# Patient Record
Sex: Female | Born: 1975 | Race: Black or African American | Hispanic: No | Marital: Married | Smoking: Never smoker
Health system: Southern US, Community
[De-identification: ages and names within clinical notes are randomized; demographics above are authoritative.]

## PROBLEM LIST (undated history)

## (undated) ENCOUNTER — Inpatient Hospital Stay (HOSPITAL_COMMUNITY): Payer: Self-pay

## (undated) DIAGNOSIS — I471 Supraventricular tachycardia, unspecified: Secondary | ICD-10-CM

## (undated) DIAGNOSIS — E559 Vitamin D deficiency, unspecified: Secondary | ICD-10-CM

## (undated) DIAGNOSIS — D352 Benign neoplasm of pituitary gland: Secondary | ICD-10-CM

## (undated) DIAGNOSIS — Z87442 Personal history of urinary calculi: Secondary | ICD-10-CM

## (undated) DIAGNOSIS — M1990 Unspecified osteoarthritis, unspecified site: Secondary | ICD-10-CM

## (undated) DIAGNOSIS — E282 Polycystic ovarian syndrome: Secondary | ICD-10-CM

## (undated) DIAGNOSIS — A6000 Herpesviral infection of urogenital system, unspecified: Secondary | ICD-10-CM

## (undated) DIAGNOSIS — Z9884 Bariatric surgery status: Secondary | ICD-10-CM

## (undated) DIAGNOSIS — G43909 Migraine, unspecified, not intractable, without status migrainosus: Secondary | ICD-10-CM

## (undated) DIAGNOSIS — O364XX0 Maternal care for intrauterine death, not applicable or unspecified: Secondary | ICD-10-CM

## (undated) DIAGNOSIS — K219 Gastro-esophageal reflux disease without esophagitis: Secondary | ICD-10-CM

## (undated) DIAGNOSIS — D497 Neoplasm of unspecified behavior of endocrine glands and other parts of nervous system: Secondary | ICD-10-CM

## (undated) DIAGNOSIS — D649 Anemia, unspecified: Secondary | ICD-10-CM

## (undated) DIAGNOSIS — Z96642 Presence of left artificial hip joint: Secondary | ICD-10-CM

## (undated) DIAGNOSIS — F419 Anxiety disorder, unspecified: Secondary | ICD-10-CM

## (undated) HISTORY — PX: TUBAL LIGATION: SHX77

## (undated) HISTORY — DX: Anemia, unspecified: D64.9

## (undated) HISTORY — DX: Morbid (severe) obesity due to excess calories: E66.01

## (undated) HISTORY — DX: Personal history of urinary calculi: Z87.442

## (undated) HISTORY — DX: Bariatric surgery status: Z98.84

## (undated) HISTORY — PX: 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): SHX6667

## (undated) HISTORY — DX: Anxiety disorder, unspecified: F41.9

## (undated) HISTORY — DX: Presence of left artificial hip joint: Z96.642

## (undated) HISTORY — PX: OTHER SURGICAL HISTORY: SHX169

## (undated) HISTORY — DX: Vitamin D deficiency, unspecified: E55.9

## (undated) HISTORY — DX: Benign neoplasm of pituitary gland: D35.2

## (undated) HISTORY — PX: TOTAL HIP ARTHROPLASTY: SHX124

## (undated) HISTORY — DX: Supraventricular tachycardia, unspecified: I47.10

## (undated) HISTORY — DX: Maternal care for intrauterine death, not applicable or unspecified: O36.4XX0

## (undated) HISTORY — DX: Herpesviral infection of urogenital system, unspecified: A60.00

---

## 2004-08-11 ENCOUNTER — Encounter: Admission: RE | Admit: 2004-08-11 | Discharge: 2004-08-11 | Payer: Self-pay | Admitting: Obstetrics and Gynecology

## 2004-12-05 ENCOUNTER — Other Ambulatory Visit: Admission: RE | Admit: 2004-12-05 | Discharge: 2004-12-05 | Payer: Self-pay | Admitting: Obstetrics and Gynecology

## 2005-08-08 ENCOUNTER — Encounter: Admission: RE | Admit: 2005-08-08 | Discharge: 2005-11-06 | Payer: Self-pay | Admitting: Obstetrics and Gynecology

## 2005-10-03 ENCOUNTER — Ambulatory Visit (HOSPITAL_COMMUNITY): Admission: RE | Admit: 2005-10-03 | Discharge: 2005-10-03 | Payer: Self-pay | Admitting: Obstetrics and Gynecology

## 2005-12-25 ENCOUNTER — Other Ambulatory Visit: Admission: RE | Admit: 2005-12-25 | Discharge: 2005-12-25 | Payer: Self-pay | Admitting: Obstetrics and Gynecology

## 2006-06-08 ENCOUNTER — Emergency Department (HOSPITAL_COMMUNITY): Admission: EM | Admit: 2006-06-08 | Discharge: 2006-06-08 | Payer: Self-pay | Admitting: *Deleted

## 2007-02-27 ENCOUNTER — Emergency Department (HOSPITAL_COMMUNITY): Admission: EM | Admit: 2007-02-27 | Discharge: 2007-02-27 | Payer: Self-pay | Admitting: Emergency Medicine

## 2008-02-27 ENCOUNTER — Ambulatory Visit: Payer: Self-pay | Admitting: Family Medicine

## 2008-02-27 DIAGNOSIS — E282 Polycystic ovarian syndrome: Secondary | ICD-10-CM | POA: Insufficient documentation

## 2008-02-27 DIAGNOSIS — J309 Allergic rhinitis, unspecified: Secondary | ICD-10-CM

## 2008-02-27 DIAGNOSIS — E663 Overweight: Secondary | ICD-10-CM | POA: Insufficient documentation

## 2008-02-27 HISTORY — DX: Allergic rhinitis, unspecified: J30.9

## 2008-02-27 LAB — CONVERTED CEMR LAB
ALT: 20 units/L (ref 0–35)
AST: 22 units/L (ref 0–37)
Albumin: 3.7 g/dL (ref 3.5–5.2)
Alkaline Phosphatase: 70 units/L (ref 39–117)
BUN: 8 mg/dL (ref 6–23)
Basophils Absolute: 0 10*3/uL (ref 0.0–0.1)
Basophils Relative: 0.4 % (ref 0.0–1.0)
Bilirubin, Direct: 0.1 mg/dL (ref 0.0–0.3)
CO2: 27 meq/L (ref 19–32)
Calcium: 9.2 mg/dL (ref 8.4–10.5)
Chloride: 107 meq/L (ref 96–112)
Cholesterol: 136 mg/dL (ref 0–200)
Creatinine, Ser: 0.7 mg/dL (ref 0.4–1.2)
Eosinophils Absolute: 0.1 10*3/uL (ref 0.0–0.7)
Eosinophils Relative: 1.5 % (ref 0.0–5.0)
GFR calc Af Amer: 126 mL/min
GFR calc non Af Amer: 104 mL/min
Glucose, Bld: 94 mg/dL (ref 70–99)
HCT: 36.2 % (ref 36.0–46.0)
HDL: 40 mg/dL (ref >39.0)
Hemoglobin: 12 g/dL (ref 12.0–15.0)
Hgb A1c MFr Bld: 5.8 % (ref 4.6–6.0)
LDL Cholesterol: 85 mg/dL (ref 0–99)
Lymphocytes Relative: 46.9 % — ABNORMAL HIGH (ref 12.0–46.0)
MCHC: 33.2 g/dL (ref 30.0–36.0)
MCV: 83.3 fL (ref 78.0–100.0)
Monocytes Absolute: 0.3 10*3/uL (ref 0.1–1.0)
Monocytes Relative: 6.8 % (ref 3.0–12.0)
Neutro Abs: 1.8 10*3/uL (ref 1.4–7.7)
Neutrophils Relative %: 44.4 % (ref 43.0–77.0)
Platelets: 263 10*3/uL (ref 150–400)
Potassium: 4.4 meq/L (ref 3.5–5.1)
RBC: 4.34 M/uL (ref 3.87–5.11)
RDW: 13.5 % (ref 11.5–14.6)
Sodium: 140 meq/L (ref 135–145)
TSH: 0.9 microintl units/mL (ref 0.35–5.50)
Total Bilirubin: 0.6 mg/dL (ref 0.3–1.2)
Total CHOL/HDL Ratio: 3.4
Total Protein: 7.4 g/dL (ref 6.0–8.3)
Triglycerides: 55 mg/dL (ref 0–149)
VLDL: 11 mg/dL (ref 0–40)
WBC: 4 10*3/uL — ABNORMAL LOW (ref 4.5–10.5)

## 2008-03-06 ENCOUNTER — Encounter: Payer: Self-pay | Admitting: Family Medicine

## 2008-03-06 ENCOUNTER — Ambulatory Visit: Payer: Self-pay | Admitting: Family Medicine

## 2008-03-06 DIAGNOSIS — D239 Other benign neoplasm of skin, unspecified: Secondary | ICD-10-CM | POA: Insufficient documentation

## 2008-03-23 ENCOUNTER — Encounter (INDEPENDENT_AMBULATORY_CARE_PROVIDER_SITE_OTHER): Payer: Self-pay | Admitting: Obstetrics and Gynecology

## 2008-03-23 ENCOUNTER — Ambulatory Visit (HOSPITAL_COMMUNITY): Admission: RE | Admit: 2008-03-23 | Discharge: 2008-03-23 | Payer: Self-pay | Admitting: Obstetrics and Gynecology

## 2008-08-17 ENCOUNTER — Ambulatory Visit (HOSPITAL_COMMUNITY): Admission: RE | Admit: 2008-08-17 | Discharge: 2008-08-17 | Payer: Self-pay | Admitting: Obstetrics and Gynecology

## 2008-09-03 ENCOUNTER — Emergency Department (HOSPITAL_COMMUNITY): Admission: EM | Admit: 2008-09-03 | Discharge: 2008-09-04 | Payer: Self-pay | Admitting: Emergency Medicine

## 2009-09-28 ENCOUNTER — Observation Stay (HOSPITAL_COMMUNITY): Admission: AD | Admit: 2009-09-28 | Discharge: 2009-09-28 | Payer: Self-pay | Admitting: Obstetrics and Gynecology

## 2009-09-28 ENCOUNTER — Encounter (HOSPITAL_COMMUNITY): Payer: Self-pay | Admitting: Emergency Medicine

## 2009-09-30 ENCOUNTER — Inpatient Hospital Stay (HOSPITAL_COMMUNITY): Admission: AD | Admit: 2009-09-30 | Discharge: 2009-09-30 | Payer: Self-pay | Admitting: Obstetrics and Gynecology

## 2009-10-02 ENCOUNTER — Inpatient Hospital Stay (HOSPITAL_COMMUNITY): Admission: AD | Admit: 2009-10-02 | Discharge: 2009-10-02 | Payer: Self-pay | Admitting: Obstetrics and Gynecology

## 2009-10-04 ENCOUNTER — Encounter (INDEPENDENT_AMBULATORY_CARE_PROVIDER_SITE_OTHER): Payer: Self-pay | Admitting: Obstetrics and Gynecology

## 2009-10-04 ENCOUNTER — Observation Stay (HOSPITAL_COMMUNITY): Admission: AD | Admit: 2009-10-04 | Discharge: 2009-10-04 | Payer: Self-pay | Admitting: Obstetrics and Gynecology

## 2010-01-11 ENCOUNTER — Encounter: Admission: RE | Admit: 2010-01-11 | Discharge: 2010-01-11 | Payer: Self-pay | Admitting: Obstetrics and Gynecology

## 2010-06-03 ENCOUNTER — Encounter: Admission: RE | Admit: 2010-06-03 | Discharge: 2010-06-03 | Payer: Self-pay | Admitting: Neurology

## 2010-09-11 ENCOUNTER — Encounter: Payer: Self-pay | Admitting: Family Medicine

## 2010-11-09 LAB — CBC
HCT: 25.5 % — ABNORMAL LOW (ref 36.0–46.0)
HCT: 30.3 % — ABNORMAL LOW (ref 36.0–46.0)
HCT: 34.1 % — ABNORMAL LOW (ref 36.0–46.0)
HCT: 34.3 % — ABNORMAL LOW (ref 36.0–46.0)
Hemoglobin: 11 g/dL — ABNORMAL LOW (ref 12.0–15.0)
Hemoglobin: 11 g/dL — ABNORMAL LOW (ref 12.0–15.0)
Hemoglobin: 9.4 g/dL — ABNORMAL LOW (ref 12.0–15.0)
Hemoglobin: 9.7 g/dL — ABNORMAL LOW (ref 12.0–15.0)
MCHC: 31.9 g/dL (ref 30.0–36.0)
MCHC: 32.1 g/dL (ref 30.0–36.0)
MCHC: 32.2 g/dL (ref 30.0–36.0)
MCHC: 32.5 g/dL (ref 30.0–36.0)
MCV: 81.3 fL (ref 78.0–100.0)
MCV: 81.6 fL (ref 78.0–100.0)
MCV: 81.6 fL (ref 78.0–100.0)
MCV: 81.8 fL (ref 78.0–100.0)
Platelets: 210 10*3/uL (ref 150–400)
Platelets: 228 10*3/uL (ref 150–400)
Platelets: 236 10*3/uL (ref 150–400)
Platelets: 241 10*3/uL (ref 150–400)
RBC: 3.12 MIL/uL — ABNORMAL LOW (ref 3.87–5.11)
RBC: 3.57 MIL/uL — ABNORMAL LOW (ref 3.87–5.11)
RBC: 3.71 MIL/uL — ABNORMAL LOW (ref 3.87–5.11)
RBC: 4.19 MIL/uL (ref 3.87–5.11)
RBC: 4.2 MIL/uL (ref 3.87–5.11)
RDW: 15 % (ref 11.5–15.5)
RDW: 15.3 % (ref 11.5–15.5)
RDW: 15.4 % (ref 11.5–15.5)
RDW: 15.6 % — ABNORMAL HIGH (ref 11.5–15.5)
WBC: 4.7 10*3/uL (ref 4.0–10.5)
WBC: 5.5 10*3/uL (ref 4.0–10.5)
WBC: 6.5 10*3/uL (ref 4.0–10.5)
WBC: 7.5 10*3/uL (ref 4.0–10.5)

## 2010-11-09 LAB — CROSSMATCH: ABO/RH(D): O POS

## 2010-11-09 LAB — URINALYSIS, ROUTINE W REFLEX MICROSCOPIC
Bilirubin Urine: NEGATIVE
Hgb urine dipstick: NEGATIVE
Specific Gravity, Urine: 1.02 (ref 1.005–1.030)
Urobilinogen, UA: 0.2 mg/dL (ref 0.0–1.0)
pH: 6 (ref 5.0–8.0)

## 2010-11-09 LAB — DIFFERENTIAL
Basophils Absolute: 0 10*3/uL (ref 0.0–0.1)
Basophils Absolute: 0 10*3/uL (ref 0.0–0.1)
Basophils Relative: 0 % (ref 0–1)
Basophils Relative: 1 % (ref 0–1)
Eosinophils Absolute: 0 10*3/uL (ref 0.0–0.7)
Eosinophils Absolute: 0.1 10*3/uL (ref 0.0–0.7)
Eosinophils Relative: 1 % (ref 0–5)
Eosinophils Relative: 1 % (ref 0–5)
Lymphocytes Relative: 30 % (ref 12–46)
Lymphocytes Relative: 41 % (ref 12–46)
Lymphs Abs: 1.9 10*3/uL (ref 0.7–4.0)
Lymphs Abs: 2.3 10*3/uL (ref 0.7–4.0)
Monocytes Absolute: 0.2 10*3/uL (ref 0.1–1.0)
Monocytes Absolute: 0.3 10*3/uL (ref 0.1–1.0)
Monocytes Relative: 4 % (ref 3–12)
Monocytes Relative: 6 % (ref 3–12)
Neutro Abs: 2.8 10*3/uL (ref 1.7–7.7)
Neutro Abs: 4.3 10*3/uL (ref 1.7–7.7)
Neutrophils Relative %: 52 % (ref 43–77)
Neutrophils Relative %: 66 % (ref 43–77)

## 2010-11-09 LAB — PROGESTERONE: Progesterone: 1.8 ng/mL

## 2010-11-09 LAB — COMPREHENSIVE METABOLIC PANEL
ALT: 16 U/L (ref 0–35)
AST: 23 U/L (ref 0–37)
Albumin: 3.6 g/dL (ref 3.5–5.2)
Alkaline Phosphatase: 73 U/L (ref 39–117)
BUN: 7 mg/dL (ref 6–23)
CO2: 25 mEq/L (ref 19–32)
Calcium: 9.4 mg/dL (ref 8.4–10.5)
Chloride: 106 mEq/L (ref 96–112)
Creatinine, Ser: 0.66 mg/dL (ref 0.4–1.2)
GFR calc Af Amer: >60 mL/min (ref >60)
GFR calc non Af Amer: >60 mL/min (ref >60)
Glucose, Bld: 86 mg/dL (ref 70–99)
Potassium: 4.2 mEq/L (ref 3.5–5.1)
Sodium: 137 mEq/L (ref 135–145)
Total Bilirubin: 0.4 mg/dL (ref 0.3–1.2)
Total Protein: 7.1 g/dL (ref 6.0–8.3)

## 2010-11-09 LAB — URINE MICROSCOPIC-ADD ON: RBC / HPF: NONE SEEN RBC/hpf (ref <3)

## 2010-11-09 LAB — HCG, QUANTITATIVE, PREGNANCY
hCG, Beta Chain, Quant, S: 1746 m[IU]/mL — ABNORMAL HIGH (ref <5)
hCG, Beta Chain, Quant, S: 2071 m[IU]/mL — ABNORMAL HIGH (ref <5)
hCG, Beta Chain, Quant, S: 2273 m[IU]/mL — ABNORMAL HIGH (ref <5)

## 2010-11-09 LAB — ABO/RH: ABO/RH(D): O POS

## 2010-11-10 LAB — URINALYSIS, ROUTINE W REFLEX MICROSCOPIC
Glucose, UA: NEGATIVE mg/dL
Hgb urine dipstick: NEGATIVE
Specific Gravity, Urine: 1.023 (ref 1.005–1.030)
Urobilinogen, UA: 0.2 mg/dL (ref 0.0–1.0)
pH: 6 (ref 5.0–8.0)

## 2010-11-10 LAB — URINE MICROSCOPIC-ADD ON

## 2010-11-10 LAB — CBC
HCT: 29.9 % — ABNORMAL LOW (ref 36.0–46.0)
Hemoglobin: 9.7 g/dL — ABNORMAL LOW (ref 12.0–15.0)
RBC: 3.63 MIL/uL — ABNORMAL LOW (ref 3.87–5.11)
WBC: 5.9 10*3/uL (ref 4.0–10.5)

## 2010-11-10 LAB — DIFFERENTIAL
Basophils Absolute: 0 10*3/uL (ref 0.0–0.1)
Lymphocytes Relative: 49 % — ABNORMAL HIGH (ref 12–46)
Lymphs Abs: 2.9 10*3/uL (ref 0.7–4.0)
Monocytes Absolute: 0.5 10*3/uL (ref 0.1–1.0)
Monocytes Relative: 9 % (ref 3–12)
Neutro Abs: 2.3 10*3/uL (ref 1.7–7.7)

## 2010-11-10 LAB — ABO/RH: ABO/RH(D): O POS

## 2010-11-10 LAB — BASIC METABOLIC PANEL
Calcium: 8.5 mg/dL (ref 8.4–10.5)
GFR calc Af Amer: >60 mL/min (ref >60)
GFR calc non Af Amer: >60 mL/min (ref >60)
Potassium: 4.3 mEq/L (ref 3.5–5.1)
Sodium: 136 mEq/L (ref 135–145)

## 2010-12-05 LAB — URINALYSIS, ROUTINE W REFLEX MICROSCOPIC
Bilirubin Urine: NEGATIVE
Glucose, UA: NEGATIVE mg/dL
Ketones, ur: NEGATIVE mg/dL
Nitrite: NEGATIVE
Protein, ur: NEGATIVE mg/dL
pH: 6 (ref 5.0–8.0)

## 2010-12-05 LAB — URINE MICROSCOPIC-ADD ON

## 2010-12-05 LAB — URINE CULTURE

## 2011-01-03 NOTE — Op Note (Signed)
Sarah Saunders, SHIFLETT NO.:  [id number]   MEDICAL RECORD NO.:  [id number]          PATIENT TYPE:  AMB   LOCATION:  SDC                           FACILITY:  [facility]   PHYSICIAN:  Sandra A. Rivard, M.D. DATE OF BIRTH:  Dec 30, 1975   DATE OF PROCEDURE:  03/23/2008  DATE OF DISCHARGE:                               OPERATIVE REPORT   PREOPERATIVE DIAGNOSIS:  Bilateral hydrosalpinx with dysfunctional  uterine bleeding secondary to polycystic ovarian syndrome.   POSTOPERATIVE DIAGNOSES:  1. Bilateral hydrosalpinx with dysfunctional uterine bleeding      secondary to polycystic ovarian syndrome.  2. Pelvic adhesions.  3. Fitz-Hugh and Curtis syndrome.   ANESTHESIA:  General, Quillian Quince, MD   PROCEDURES:  1. Dilation and curettage.  2. Operative laparoscopy with lysis of adhesions.  3. Cure of bilateral hydrosalpinx.   SURGEON:  Crist Fat. Rivard, MD   ASSISTANT:  Elmira J. Lowell Guitar, PA   PROCEDURE IN DETAIL:  After being informed of the planned procedure with possible  complications including bleeding, infection, injury to bowel, bladder or  ureters, and need for laparotomy, an informed consent was obtained.  The  patient was taken to OR#3, given general anesthesia with endotracheal  intubation without complication.  The patient was placed in a lithotomy  position with knee-high sequential compressive devices, prepped and  draped in a sterile fashion, and a Foley catheter was inserted in her  bladder.  A weighted speculum was inserted.  The anterior lip of the  cervix was grasped with a tenaculum forceps and the uterus was sounded  at 8.5 cm.  The cervix was easily dilated using Hegar dilator until #27,  which allowed easy entry of a small sharp curette, which was used to  curette the endometrial cavity removing a small amount of endometrial  curettings.  After this was completed, an acorn manipulator was placed  and held in place by the previously placed  tenaculum forceps.    We infiltrated the umbilical area with 5 mL of Marcaine 0.25 and we  performed a semi-elliptical incision, which was brought down sharply to  the fascia.  The fascia was identified, grasped with Kocher forceps and  incised with Mayo scissors.  After the fascia was incised, attempt to  identify the peritoneum was very difficult due to an unusual placement  of abdominal muscle.  Nevertheless, despite good retraction and  palpation, we are unable to enter the peritoneum in the safe way.  Decision was made at that time to insert a Veress needle.  The placement  of the needle was verified with the drop test and we then insufflated a  pneumoperitoneum with CO2 at a maximum pressure of 15 mmHg.  This was  done in an attempt to more easily identify the peritoneum, but this  attempt fails as well.  A normal 10-mm trocar was then inserted bluntly  in the abdomen after we have placed a purse-string suture of 0 Vicryl on  the fascia.  The trocar was well placed and we can safely insert a  laparoscope with video camera.  A suprapubic 5-mm trocar  was then  inserted after infiltrating with Marcaine 0.25 and the left lower  quadrant 5-mm trocar was also inserted under direct visualization after  infiltrating with Marcaine 0.25.   OBSERVATION:  Anterior cul-de-sac has fine adhesions between the bladder  wall in the lower uterine segment of unknown etiology, the patient never  having had any surgery.  The uterus was normal.  Posterior cul-de-sac  has fine adhesions in the very lower part of the cul-de-sac.  The right  tube has a significant hydrosalpinx with its maximum diameter at the  fimbrial end, which was approximately 4 cm.  No fimbriae is identified,  but the tube is essentially free of adhesions and the right ovary was  normal.  On the left side, it was difficult to evaluate the tube and the  ovary due to small adhesions between the sigmoid colon and the pelvic  wall.  We  do have a sense that the tube was also blocked, but impossible  to mobilize at this time.  The appendix was visualized and normal.  The  liver was visualized and normal, although we see adhesions between its  anterior area and the anterior abdominal wall compatible with the Santiago Bumpers and Lyda Jester syndrome.  The gallbladder was not visualized.  The  stomach was visualized, full of air and a request was made to anesthesia  to insert a NG tube.   Using hot scissors and blunt dissector, we started with removing the  adhesions between the sigmoid colon and the pelvic wall, which was done  essentially without complication.  This now allows [place] to visualize the  left tube in the left ovary, which are partially adherent to the left  pelvic wall with fine grade 1 adhesions.  Once these were sharply  reduced, we can mobilize the tube completely and the ovary completely.  The tubes have a complete hydrosalpinx with absent fimbriae, and its  maximum diameter at the fimbrial end was approximately 3 cm.  The ovary  was normal.  Chromopertubation leads no spillage, but a good filling of  both hydrosalpinx and decision was made to open both of those.  We  started with the right side and performed the same way on the left side  by making a plus incision at the tip with cold sharp scissors.  The  serosa at the 4 corners was then superficially cauterized to proceed  with eversion of the opening.  On the right side, we saw the appearance  of what appears to be normal fimbriae on the lateral wall of the tube.  On the left tube, we proceeded in the same exact fashion, but there was  a little bit of bleeding, which required some cauterization on the edge  of the incision.  At the end of the procedure, chromopertubation leads  to spillage on both sides.  We then profusely irrigated with warm saline  multiple times.  We verified hemostasis, which was very adequate on both  sides and we do leave about 20 mL of  fluid in the pelvis.  The two 5-mm  trocars were removed and the 10-mm umbilical trocar was removed after  evacuating the pneumoperitoneum.  At this point, please let me mention  that during the procedure, the trocar came out and we had to reinsert  under direct visualization, a Hasson trocar with the balloon, which was  inflated with 15 mL, which provided [place] with a much better visualization  keeping the pneumoperitoneum intact.  Once the pneumoperitoneum  was  removed and all trocars were removed, the umbilical fascia was closed  with a previously placed purse-string suture of 0 Vicryl.  All incisions  were then closed with subcuticular suture of 3-0 Monocryl and Dermabond.    Instruments and sponge count were complete x2.  Estimated blood loss was  minimal.  The procedure was very well tolerated by the patient who was  taken to recovery room in a well and stable condition.   SPECIMEN:  Endometrial curettings were sent to pathology.      Crist Fat Rivard, M.D.  Electronically Signed     SAR/MEDQ  D:  03/23/2008  T:  03/24/2008  Job:  [id number]

## 2011-03-20 ENCOUNTER — Emergency Department (HOSPITAL_COMMUNITY)
Admission: EM | Admit: 2011-03-20 | Discharge: 2011-03-20 | Disposition: A | Discharge status: Home / Self Care | Payer: Self-pay | Attending: Emergency Medicine | Admitting: Emergency Medicine

## 2011-03-20 DIAGNOSIS — R110 Nausea: Secondary | ICD-10-CM | POA: Insufficient documentation

## 2011-03-20 DIAGNOSIS — R42 Dizziness and giddiness: Secondary | ICD-10-CM | POA: Insufficient documentation

## 2011-03-20 LAB — URINALYSIS, ROUTINE W REFLEX MICROSCOPIC
Glucose, UA: NEGATIVE mg/dL
Ketones, ur: 15 mg/dL — AB
Protein, ur: 30 mg/dL — AB

## 2011-03-20 LAB — CBC
HCT: 33.6 % — ABNORMAL LOW (ref 36.0–46.0)
Hemoglobin: 11 g/dL — ABNORMAL LOW (ref 12.0–15.0)
MCH: 26.2 pg (ref 26.0–34.0)
MCV: 80 fL (ref 78.0–100.0)
RBC: 4.2 MIL/uL (ref 3.87–5.11)

## 2011-03-20 LAB — BASIC METABOLIC PANEL
CO2: 27 mEq/L (ref 19–32)
Calcium: 9.7 mg/dL (ref 8.4–10.5)
Creatinine, Ser: 0.54 mg/dL (ref 0.50–1.10)
Glucose, Bld: 98 mg/dL (ref 70–99)

## 2011-03-20 LAB — URINE MICROSCOPIC-ADD ON

## 2011-03-20 LAB — POCT PREGNANCY, URINE: Preg Test, Ur: NEGATIVE

## 2011-03-20 LAB — DIFFERENTIAL
Lymphs Abs: 3.1 10*3/uL (ref 0.7–4.0)
Monocytes Relative: 5 % (ref 3–12)
Neutro Abs: 2.1 10*3/uL (ref 1.7–7.7)
Neutrophils Relative %: 37 % — ABNORMAL LOW (ref 43–77)

## 2011-05-19 LAB — CBC
HCT: 36.5
Hemoglobin: 11.6 — ABNORMAL LOW
Platelets: 231
RBC: 4.36
WBC: 5

## 2011-10-17 ENCOUNTER — Encounter (INDEPENDENT_AMBULATORY_CARE_PROVIDER_SITE_OTHER): Payer: Self-pay | Admitting: Obstetrics and Gynecology

## 2011-10-17 DIAGNOSIS — Z3040 Encounter for surveillance of contraceptives, unspecified: Secondary | ICD-10-CM

## 2011-12-16 ENCOUNTER — Emergency Department (HOSPITAL_COMMUNITY)
Admission: EM | Admit: 2011-12-16 | Discharge: 2011-12-16 | Disposition: A | Discharge status: Home / Self Care | Payer: Self-pay | Attending: Emergency Medicine | Admitting: Emergency Medicine

## 2011-12-16 ENCOUNTER — Encounter (HOSPITAL_COMMUNITY): Payer: Self-pay | Admitting: *Deleted

## 2011-12-16 ENCOUNTER — Emergency Department (HOSPITAL_COMMUNITY): Payer: Self-pay

## 2011-12-16 DIAGNOSIS — R002 Palpitations: Secondary | ICD-10-CM | POA: Insufficient documentation

## 2011-12-16 DIAGNOSIS — R079 Chest pain, unspecified: Secondary | ICD-10-CM | POA: Insufficient documentation

## 2011-12-16 HISTORY — DX: Polycystic ovarian syndrome: E28.2

## 2011-12-16 LAB — POCT I-STAT TROPONIN I: Troponin i, poc: 0.04 ng/mL (ref 0.00–0.08)

## 2011-12-16 LAB — DIFFERENTIAL
Eosinophils Relative: 6 % — ABNORMAL HIGH (ref 0–5)
Lymphocytes Relative: 46 % (ref 12–46)
Lymphs Abs: 2.3 10*3/uL (ref 0.7–4.0)
Monocytes Relative: 6 % (ref 3–12)

## 2011-12-16 LAB — COMPREHENSIVE METABOLIC PANEL
ALT: 22 U/L (ref 0–35)
Alkaline Phosphatase: 78 U/L (ref 39–117)
BUN: 15 mg/dL (ref 6–23)
CO2: 23 mEq/L (ref 19–32)
Calcium: 9.5 mg/dL (ref 8.4–10.5)
GFR calc Af Amer: >90 mL/min (ref >90)
GFR calc non Af Amer: >90 mL/min (ref >90)
Glucose, Bld: 103 mg/dL — ABNORMAL HIGH (ref 70–99)
Potassium: 3.8 mEq/L (ref 3.5–5.1)
Sodium: 138 mEq/L (ref 135–145)

## 2011-12-16 LAB — URINALYSIS, ROUTINE W REFLEX MICROSCOPIC
Bilirubin Urine: NEGATIVE
Ketones, ur: NEGATIVE mg/dL
Nitrite: NEGATIVE
Protein, ur: 30 mg/dL — AB
Urobilinogen, UA: 0.2 mg/dL (ref 0.0–1.0)

## 2011-12-16 LAB — TSH: TSH: 1.959 u[IU]/mL (ref 0.350–4.500)

## 2011-12-16 LAB — CBC
Hemoglobin: 12.1 g/dL (ref 12.0–15.0)
MCV: 81.7 fL (ref 78.0–100.0)
Platelets: 241 10*3/uL (ref 150–400)
RBC: 4.6 MIL/uL (ref 3.87–5.11)
WBC: 5.1 10*3/uL (ref 4.0–10.5)

## 2011-12-16 LAB — PREGNANCY, URINE: Preg Test, Ur: NEGATIVE

## 2011-12-16 LAB — URINE MICROSCOPIC-ADD ON

## 2011-12-16 LAB — D-DIMER, QUANTITATIVE: D-Dimer, Quant: 0.43 ug/mL-FEU (ref 0.00–0.48)

## 2011-12-16 MED ORDER — METOPROLOL TARTRATE 25 MG PO TABS: ORAL_TABLET | ORAL | Status: DC

## 2011-12-16 NOTE — ED Notes (Signed)
Returned from x-ray, family at bedside

## 2011-12-16 NOTE — ED Notes (Signed)
Pt denies any palpitations or sob at this time. Pt placed back on monitor after going to the restroom.

## 2011-12-16 NOTE — ED Notes (Signed)
Pt was having sob, found to have Hr 220, 160/90, vagal maneuver performed, hr decreased to 104. Pt says that she has had the same episode prior

## 2011-12-16 NOTE — ED Notes (Signed)
Pt says that she felt her heart racing and was having shortness of breath and chest pain. Symptoms have since subsided. Resting.

## 2011-12-16 NOTE — ED Provider Notes (Cosign Needed)
History     CSN: [id number]  Arrival date & time 12/16/11  4098   First MD Initiated Contact with Patient 12/16/11 0732      Chief Complaint  Patient presents with  . Palpitations    (Consider location/radiation/quality/duration/timing/severity/associated sxs/prior treatment) HPI Comments: Patient is a 32 and her workup this morning and her heart was beating very fast. This lasted about 20 minutes. Paramedics were called and they found her heart rate to 24 blood pressure 150/110 she is unaware of any precipitating event. She says this happens every other kinds. She had just been seen in the ED for at 8 years ago an EKG was normal then. The last prior episode was in February, and she did not seek medical evaluation. The episode has resolved, and she feels some discomfort in her chest at present.  Patient is a 36 y.o. female presenting with palpitations. The history is provided by the patient. No language interpreter was used.  Palpitations  This is a recurrent problem. The current episode started 1 to 2 hours ago. The problem occurs rarely. The problem has been resolved. Associated with: No precipitating event. On average, each episode lasts 20 minutes. Associated symptoms include chest pressure. She has tried nothing for the symptoms. Risk factors include no known risk factors. Past medical history comments: Polycystic ovary disease, prolactinoma..    Past Medical History  Diagnosis Date  . Polycystic ovary disease     Past Surgical History  Procedure Date  . Tubal ligation     right fallopian tube removed    No family history on file.  History  Substance Use Topics  . Smoking status: Never Smoker   . Smokeless tobacco: Not on file  . Alcohol Use: No    OB History    Grav Para Term Preterm Abortions TAB SAB Ect Mult Living                  Review of Systems  Constitutional: Negative.   HENT: Negative.   Eyes: Negative.   Respiratory: Positive for chest tightness.     Cardiovascular: Positive for palpitations.  Gastrointestinal: Negative.   Genitourinary: Negative.   Musculoskeletal: Negative.   Skin: Negative.   Neurological: Negative.   Psychiatric/Behavioral: Negative.     Allergies  Stadol  Home Medications   Current Outpatient Rx  Name Route Sig Dispense Refill  . CABERGOLINE 0.5 MG PO TABS Oral Take 0.25 mg by mouth 2 (two) times a week. Take on Sundays and Wednesday    . ETONOGESTREL-ETHINYL ESTRADIOL 0.12-0.015 MG/24HR VA RING Vaginal Place 1 each vaginally every 28 (twenty-eight) days. Insert vaginally and leave in place for 3 consecutive weeks, then remove for 1 week.      BP 136/78  Pulse 102  Temp(Src) 98 F (36.7 C) (Oral)  Resp 12  SpO2 100%  LMP 12/14/2011  Physical Exam  Nursing note and vitals reviewed. Constitutional: She is oriented to person, place, and time. She appears well-developed and well-nourished. No distress.  HENT:  Head: Normocephalic and atraumatic.  Right Ear: External ear normal.  Left Ear: External ear normal.  Mouth/Throat: Oropharynx is clear and moist.  Eyes: Conjunctivae and EOM are normal. Pupils are equal, round, and reactive to light.  Neck: Normal range of motion. Neck supple. No thyromegaly present.  Cardiovascular: Normal rate, regular rhythm and normal heart sounds.   Pulmonary/Chest: Effort normal and breath sounds normal.  Abdominal: Soft. Bowel sounds are normal.  Musculoskeletal: Normal range of motion. She  exhibits no edema and no tenderness.  Lymphadenopathy:    She has no cervical adenopathy.  Neurological: She is alert and oriented to person, place, and time.       No sensory or motor deficits.  Skin: Skin is warm and dry.  Psychiatric: She has a normal mood and affect. Her behavior is normal.    ED Course  Procedures (including critical care time)   Labs Reviewed  CBC  DIFFERENTIAL  COMPREHENSIVE METABOLIC PANEL  URINALYSIS, ROUTINE W REFLEX MICROSCOPIC  URINE  CULTURE  PREGNANCY, URINE  D-DIMER, QUANTITATIVE   8:02 AM  Date: 12/16/2011  Rate: 89  Rhythm: normal sinus rhythm  QRS Axis: normal  Intervals: normal  ST/T Wave abnormalities: normal  Conduction Disutrbances:none  Narrative Interpretation: Normal  Old EKG Reviewed: unchanged  8:09 AM Patient was seen and had physical examination. Laboratory tests were ordered. EKG was normal.  11:54 AM Results for orders placed during the hospital encounter of 12/16/11  CBC      Component Value Range   WBC 5.1  4.0 - 10.5 (K/uL)   RBC 4.60  3.87 - 5.11 (MIL/uL)   Hemoglobin 12.1  12.0 - 15.0 (g/dL)   HCT 40.9  81.1 - 91.4 (%)   MCV 81.7  78.0 - 100.0 (fL)   MCH 26.3  26.0 - 34.0 (pg)   MCHC 32.2  30.0 - 36.0 (g/dL)   RDW 78.2  95.6 - 21.3 (%)   Platelets 241  150 - 400 (K/uL)  DIFFERENTIAL      Component Value Range   Neutrophils Relative 41 (*) 43 - 77 (%)   Neutro Abs 2.1  1.7 - 7.7 (K/uL)   Lymphocytes Relative 46  12 - 46 (%)   Lymphs Abs 2.3  0.7 - 4.0 (K/uL)   Monocytes Relative 6  3 - 12 (%)   Monocytes Absolute 0.3  0.1 - 1.0 (K/uL)   Eosinophils Relative 6 (*) 0 - 5 (%)   Eosinophils Absolute 0.3  0.0 - 0.7 (K/uL)   Basophils Relative 1  0 - 1 (%)   Basophils Absolute 0.0  0.0 - 0.1 (K/uL)  COMPREHENSIVE METABOLIC PANEL      Component Value Range   Sodium 138  135 - 145 (mEq/L)   Potassium 3.8  3.5 - 5.1 (mEq/L)   Chloride 105  96 - 112 (mEq/L)   CO2 23  19 - 32 (mEq/L)   Glucose, Bld 103 (*) 70 - 99 (mg/dL)   BUN 15  6 - 23 (mg/dL)   Creatinine, Ser 0.86  0.50 - 1.10 (mg/dL)   Calcium 9.5  8.4 - 57.8 (mg/dL)   Total Protein 7.1  6.0 - 8.3 (g/dL)   Albumin 3.3 (*) 3.5 - 5.2 (g/dL)   AST 22  0 - 37 (U/L)   ALT 22  0 - 35 (U/L)   Alkaline Phosphatase 78  39 - 117 (U/L)   Total Bilirubin 0.2 (*) 0.3 - 1.2 (mg/dL)   GFR calc non Af Amer >90  >90 (mL/min)   GFR calc Af Amer >90  >90 (mL/min)  URINALYSIS, ROUTINE W REFLEX MICROSCOPIC      Component Value Range    Color, Urine RED (*) YELLOW    APPearance CLOUDY (*) CLEAR    Specific Gravity, Urine 1.021  1.005 - 1.030    pH 5.5  5.0 - 8.0    Glucose, UA NEGATIVE  NEGATIVE (mg/dL)   Hgb urine dipstick LARGE (*) NEGATIVE  Bilirubin Urine NEGATIVE  NEGATIVE    Ketones, ur NEGATIVE  NEGATIVE (mg/dL)   Protein, ur 30 (*) NEGATIVE (mg/dL)   Urobilinogen, UA 0.2  0.0 - 1.0 (mg/dL)   Nitrite NEGATIVE  NEGATIVE    Leukocytes, UA MODERATE (*) NEGATIVE   PREGNANCY, URINE      Component Value Range   Preg Test, Ur NEGATIVE  NEGATIVE   D-DIMER, QUANTITATIVE      Component Value Range   D-Dimer, Quant 0.43  0.00 - 0.48 (ug/mL-FEU)  POCT I-STAT TROPONIN I      Component Value Range   Troponin i, poc 0.04  0.00 - 0.08 (ng/mL)   Comment 3           URINE MICROSCOPIC-ADD ON      Component Value Range   Squamous Epithelial / LPF FEW (*) RARE    WBC, UA 3-6  <3 (WBC/hpf)   RBC / HPF TOO NUMEROUS TO COUNT  <3 (RBC/hpf)   Bacteria, UA RARE  RARE    Urine-Other MUCOUS PRESENT     Dg Chest 2 View  12/16/2011  *RADIOLOGY REPORT*  Clinical Data: Mid chest pain with shortness of breath. Tachycardia.  CHEST - 2 VIEW  Comparison: None.  Findings: The heart size and mediastinal contours are normal.  The lungs appear clear for the degree of inspiration.  There is no edema or pleural effusion.  There is no pneumothorax.  The osseous structures appear normal.  IMPRESSION: No active cardiopulmonary process.  Original Report Authenticated By: Gerrianne Scale, M.D.    Lab workup was negative.  Pt is having menstrual period, which explains her hematuria.  Rx metoprolol 25 mg qd to treat palpitations.   1. Palpitations             Carleene Cooper III, MD 12/16/11 1158

## 2011-12-17 LAB — URINE CULTURE

## 2011-12-26 ENCOUNTER — Telehealth: Payer: Self-pay | Admitting: Obstetrics and Gynecology

## 2011-12-26 NOTE — Telephone Encounter (Signed)
Laura received 

## 2011-12-26 NOTE — Telephone Encounter (Signed)
Wants referral to endocrinologist

## 2011-12-28 ENCOUNTER — Other Ambulatory Visit: Payer: Self-pay

## 2011-12-28 DIAGNOSIS — E282 Polycystic ovarian syndrome: Secondary | ICD-10-CM

## 2011-12-28 DIAGNOSIS — L659 Nonscarring hair loss, unspecified: Secondary | ICD-10-CM

## 2011-12-28 NOTE — Telephone Encounter (Signed)
LM for pt that referral to Dr Talmage Nap has been made and that they will call for appt time.  ld

## 2011-12-28 NOTE — Telephone Encounter (Signed)
Dr Talmage Nap

## 2012-01-08 ENCOUNTER — Encounter: Payer: Self-pay | Admitting: Cardiology

## 2012-01-08 ENCOUNTER — Ambulatory Visit (INDEPENDENT_AMBULATORY_CARE_PROVIDER_SITE_OTHER): Payer: Self-pay | Admitting: Cardiology

## 2012-01-08 VITALS — BP 130/90 | HR 94 | Ht 68.0 in | Wt 252.0 lb

## 2012-01-08 DIAGNOSIS — E663 Overweight: Secondary | ICD-10-CM

## 2012-01-08 DIAGNOSIS — R000 Tachycardia, unspecified: Secondary | ICD-10-CM

## 2012-01-08 NOTE — Assessment & Plan Note (Signed)
I applaud her weight loss and I encourage more of the same. 

## 2012-01-08 NOTE — Progress Notes (Signed)
HPI The patient presents for evaluation of palpitations. She reports about 4 episodes of tachycardia palpitations over the past 8 years. The last was last month and prior to that it was 1 in February. He describes a rapid heart rate. She's been having some sinus problems is trying to cough or clear her sinuses. She felt like her heart start racing and he did. When she called a fireman they reported her heart rate to be 240. This was on a pulse ox. She felt breathless but was oxygenating. She did not have chest pressure, neck or arm discomfort. She didn't have presyncope or syncope. They did not record a rhythm strip. EMS arrived. Her symptoms broke around the time they arrived. She was transported to the emergency room where I did review an EKG which demonstrated normal sinus rhythm. She otherwise feels well. She denies any cardiac symptoms with her usual activities. Over the past year or so she has lost 50 pounds through diet and exercise. She has no limitations with this.  Allergies  Allergen Reactions  . Stadol (Butorphanol Tartrate) Itching    dizziness    Current Outpatient Prescriptions  Medication Sig Dispense Refill  . cabergoline (DOSTINEX) 0.5 MG tablet Take 0.25 mg by mouth 2 (two) times a week. Take on Sundays and Wednesday        Past Medical History  Diagnosis Date  . Polycystic ovary disease   . Prolactinoma     Past Surgical History  Procedure Date  . Tubal ligation     right fallopian tube removed    Family History  Problem Relation Age of Onset  . Hypertension Father   . Hypertension Mother     History   Social History  . Marital Status: Married    Spouse Name: N/A    Number of Children: 1  . Years of Education: N/A   Occupational History  . LPN    Social History Main Topics  . Smoking status: Never Smoker   . Smokeless tobacco: Not on file  . Alcohol Use: No  . Drug Use: No  . Sexually Active:    Other Topics Concern  . Not on file   Social  History Narrative   Lives with husband and eighteen year.      ROS:  As stated in the HPI and negative for all other systems.  PHYSICAL EXAM BP 130/90  Pulse 94  Ht 5\' 8"  (1.727 m)  Wt 252 lb (114.306 kg)  BMI 38.32 kg/m2  LMP 12/14/2011 GENERAL:  Well appearing HEENT:  Pupils equal round and reactive, fundi not visualized, oral mucosa unremarkable NECK:  No jugular venous distention, waveform within normal limits, carotid upstroke brisk and symmetric, no bruits, no thyromegaly LYMPHATICS:  No cervical, inguinal adenopathy LUNGS:  Clear to auscultation bilaterally BACK:  No CVA tenderness CHEST:  Unremarkable HEART:  PMI not displaced or sustained,S1 and S2 within normal limits, no S3, no S4, no clicks, no rubs, no murmurs ABD:  Flat, positive bowel sounds normal in frequency in pitch, no bruits, no rebound, no guarding, no midline pulsatile mass, no hepatomegaly, no splenomegaly EXT:  2 plus pulses throughout, no edema, no cyanosis no clubbing SKIN:  No rashes no nodules NEURO:  Cranial nerves II through XII grossly intact, motor grossly intact throughout PSYCH:  Cognitively intact, oriented to person place and time  EKG:  Sinus rhythm, rate 67, axis within normal limits, intervals within normal limits, no acute ST-T wave changes.  01/08/2012  ASSESSMENT AND PLAN

## 2012-01-08 NOTE — Assessment & Plan Note (Signed)
I suspect an SVT. Her TSH and BMET were normal. We did discuss vagal maneuvers. At this point I don't think there be an indication for further medical management. She didn't want to take the beta blocker that was suggested in the emergency room. She does understand that if she has further tachypalpitations she needs to present immediately for rhythm strip or call EMS. If these increase in frequency or intensity he did begin to discuss the possibility of ablation versus further medical management.

## 2012-04-26 ENCOUNTER — Ambulatory Visit: Payer: Self-pay | Admitting: Obstetrics and Gynecology

## 2012-09-04 ENCOUNTER — Telehealth: Payer: Self-pay | Admitting: Obstetrics and Gynecology

## 2012-09-04 NOTE — Telephone Encounter (Signed)
sr pt 

## 2012-09-06 ENCOUNTER — Telehealth: Payer: Self-pay | Admitting: Obstetrics and Gynecology

## 2012-09-06 NOTE — Telephone Encounter (Signed)
sr pt 

## 2012-09-06 NOTE — Telephone Encounter (Signed)
Pt wants to know the status of her referral to an endocrinologist

## 2012-09-09 NOTE — Telephone Encounter (Signed)
Pt states that she is needing u/s to endocrinologist. Pt states that she was referred to Dr. Willeen Cass office last year but at the time she did not have ins. Was not able to keep scheduled apt. Notes in pt chart suggest that she does need apt for PCOS. Referral order filled out on Dr. Willeen Cass web site and demographic info faxed to office. Advised pt that office will contact for apt.  Pt voiced understanding  Darien Ramus, CMA

## 2012-09-09 NOTE — Telephone Encounter (Signed)
DONE

## 2012-11-24 ENCOUNTER — Emergency Department (HOSPITAL_COMMUNITY)
Admission: EM | Admit: 2012-11-24 | Discharge: 2012-11-24 | Disposition: A | Discharge status: Home / Self Care | Payer: BC Managed Care – PPO | Attending: Emergency Medicine | Admitting: Emergency Medicine

## 2012-11-24 ENCOUNTER — Encounter (HOSPITAL_COMMUNITY): Payer: Self-pay | Admitting: Emergency Medicine

## 2012-11-24 DIAGNOSIS — Z8639 Personal history of other endocrine, nutritional and metabolic disease: Secondary | ICD-10-CM | POA: Insufficient documentation

## 2012-11-24 DIAGNOSIS — J029 Acute pharyngitis, unspecified: Secondary | ICD-10-CM

## 2012-11-24 DIAGNOSIS — H6692 Otitis media, unspecified, left ear: Secondary | ICD-10-CM

## 2012-11-24 DIAGNOSIS — Z79899 Other long term (current) drug therapy: Secondary | ICD-10-CM | POA: Insufficient documentation

## 2012-11-24 DIAGNOSIS — Z862 Personal history of diseases of the blood and blood-forming organs and certain disorders involving the immune mechanism: Secondary | ICD-10-CM | POA: Insufficient documentation

## 2012-11-24 DIAGNOSIS — H9209 Otalgia, unspecified ear: Secondary | ICD-10-CM | POA: Insufficient documentation

## 2012-11-24 DIAGNOSIS — H6690 Otitis media, unspecified, unspecified ear: Secondary | ICD-10-CM | POA: Insufficient documentation

## 2012-11-24 HISTORY — DX: Neoplasm of unspecified behavior of endocrine glands and other parts of nervous system: D49.7

## 2012-11-24 MED ORDER — PREDNISONE 20 MG PO TABS: 40.0000 mg | ORAL_TABLET | Freq: Every day | ORAL | Status: DC

## 2012-11-24 MED ORDER — AMOXICILLIN 500 MG PO CAPS: 500.0000 mg | ORAL_CAPSULE | Freq: Three times a day (TID) | ORAL | Status: DC

## 2012-11-24 NOTE — ED Notes (Signed)
Pt c/o sore throat x 1 wk.  Was seen at urgent care yesterday.  States her throat still hurts.

## 2012-11-24 NOTE — ED Provider Notes (Signed)
History     CSN: [id number]  Arrival date & time 11/24/12  7829   First MD Initiated Contact with Patient 11/24/12 1023      Chief Complaint  Patient presents with  . Sore Throat    (Consider location/radiation/quality/duration/timing/severity/associated sxs/prior treatment) HPI Comments: Patient presents emergency department with chief complaint of sore throat and earache. She states that she has had a sore throat for one week. States that she has had an earache for one day. She states that her ear feels stopped up. She is tried taking TheraFlu with some relief. States that her discomfort is about a 5/10, but after she takes a TheraFlu and is 3/10. She states that her swallow, but she denies any difficulty breathing. She denies any fevers or chills, nausea, or vomiting. She states that she was seen at urgent care yesterday, and had a negative strep test there.  The history is provided by the patient. No language interpreter was used.    Past Medical History  Diagnosis Date  . Polycystic ovary disease   . Prolactinoma   . Pituitary tumor     Past Surgical History  Procedure Laterality Date  . Tubal ligation      right fallopian tube removed    Family History  Problem Relation Age of Onset  . Hypertension Father   . Hypertension Mother     History  Substance Use Topics  . Smoking status: Never Smoker   . Smokeless tobacco: Not on file  . Alcohol Use: No    OB History   Grav Para Term Preterm Abortions TAB SAB Ect Mult Living                  Review of Systems  All other systems reviewed and are negative.    Allergies  Stadol  Home Medications   Current Outpatient Rx  Name  Route  Sig  Dispense  Refill  . bromocriptine (PARLODEL) 2.5 MG tablet   Oral   Take 2.5 mg by mouth Nightly.         . calcium-vitamin D (OSCAL WITH D) 500-200 MG-UNIT per tablet   Oral   Take 1 tablet by mouth daily.         . Chlorphen-Pseudoephed-APAP (THERAFLU FLU/COLD  PO)   Oral   Take 1 packet by mouth daily as needed (flu symptoms).         . fish oil-omega-3 fatty acids 1000 MG capsule   Oral   Take 1 g by mouth daily.         Marland Kitchen glucosamine-chondroitin 500-400 MG tablet   Oral   Take 1 tablet by mouth daily.         Marland Kitchen ibuprofen (ADVIL,MOTRIN) 200 MG tablet   Oral   Take 200 mg by mouth every 6 (six) hours as needed for pain.         Marland Kitchen lidocaine (XYLOCAINE) 2 % solution   Oral   Take 20 mLs by mouth as needed for pain (pain).         . metFORMIN (GLUCOPHAGE-XR) 500 MG 24 hr tablet   Oral   Take 500 mg by mouth Nightly.         . Multiple Vitamins-Minerals (MULTIVITAMIN WITH MINERALS) tablet   Oral   Take 1 tablet by mouth daily.         . pseudoephedrine-acetaminophen (TYLENOL SINUS) 30-500 MG TABS   Oral   Take 1 tablet by mouth every 4 (four) hours  as needed.           BP 150/90  Pulse 91  Temp(Src) 98.8 F (37.1 C) (Oral)  Resp 18  Ht 5' 8.5" (1.74 m)  Wt 284 lb (128.822 kg)  BMI 42.55 kg/m2  SpO2 99%  LMP 11/24/2012  Physical Exam  Nursing note and vitals reviewed. Constitutional: She is oriented to person, place, and time. She appears well-developed and well-nourished.  HENT:  Head: Normocephalic and atraumatic.  Red and inflamed oropharynx, without exudates, no signs of tonsillar, peritonsillar or gingival abscesses, uvula is midline, airway is intact, no signs of Ludwig angina  Left ear is red, and with congestion behind the TM  Eyes: Conjunctivae and EOM are normal. Pupils are equal, round, and reactive to light.  Neck: Normal range of motion. Neck supple.  Cardiovascular: Normal rate, regular rhythm and normal heart sounds.  Exam reveals no gallop and no friction rub.   No murmur heard. Pulmonary/Chest: Effort normal and breath sounds normal. No respiratory distress. She has no wheezes.  Abdominal: Soft. She exhibits no distension. There is no tenderness.  Musculoskeletal: Normal range of  motion. She exhibits no edema and no tenderness.  Lymphadenopathy:    She has cervical adenopathy.  Neurological: She is alert and oriented to person, place, and time.  Skin: Skin is warm and dry.  Psychiatric: She has a normal mood and affect. Her behavior is normal. Judgment and thought content normal.    ED Course  Procedures (including critical care time)  Labs Reviewed - No data to display No results found.   1. Otitis media, left   2. Pharyngitis       MDM  Patient with sore throat, and earache. Suspect otitis media. Patient had a negative strep test yesterday, however I will treat the otitis media today with amoxicillin, therefore I will not do a strep test today. Patient states that she also feels better she has a short course of prednisone. Patient's airway is intact, no fever, O2 saturation is 99%. No signs abscesses.        Roxy Horseman, PA-C 11/24/12 1047

## 2012-11-24 NOTE — ED Provider Notes (Signed)
Medical screening examination/treatment/procedure(s) were performed by non-physician practitioner and as supervising physician I was immediately available for consultation/collaboration.  Shabazz Mckey, MD 11/24/12 1526 

## 2012-11-24 NOTE — ED Notes (Signed)
Pt seen in urgent care yesterday, stated that strep was negative. Lidocaine that was prescribed, not effective. Pt self medicated with OTC meds over last 7 days. Took one dosage of Cipro, 2 dosages of amoxicillin, yesterday.

## 2013-06-05 ENCOUNTER — Institutional Professional Consult (permissible substitution): Payer: Self-pay | Admitting: Neurology

## 2013-06-05 ENCOUNTER — Encounter: Payer: Self-pay | Admitting: Neurology

## 2013-06-05 ENCOUNTER — Ambulatory Visit (INDEPENDENT_AMBULATORY_CARE_PROVIDER_SITE_OTHER): Payer: BC Managed Care – PPO | Admitting: Neurology

## 2013-06-05 VITALS — BP 130/83 | HR 68 | Temp 98.4°F | Ht 67.0 in | Wt 272.0 lb

## 2013-06-05 DIAGNOSIS — G4733 Obstructive sleep apnea (adult) (pediatric): Secondary | ICD-10-CM

## 2013-06-05 DIAGNOSIS — G4710 Hypersomnia, unspecified: Secondary | ICD-10-CM

## 2013-06-05 DIAGNOSIS — R400 Somnolence: Secondary | ICD-10-CM

## 2013-06-05 DIAGNOSIS — E282 Polycystic ovarian syndrome: Secondary | ICD-10-CM

## 2013-06-05 NOTE — Progress Notes (Signed)
Subjective:    Patient ID: Sarah Saunders is a 37 y.o. female.  HPI  Huston Foley, MD, PhD [facility] [address] [address] [place], [place] [address]  Dear Dr. Nicholos Johns,   I saw your patient, Sarah Saunders, upon your kind request, in my neurologic clinic today for initial consultation of her sleep disorder, in particular for severe EDS and concern for obstructive sleep apnea. The patient is unaccompanied today. As you know, Sarah Saunders is a very pleasant 37 year old right-handed woman with an underlying medical history of obesity, pituitary microadenoma, migraine HAs, PCOS and allergic rhinitis, who is has a several year Hx; she is reported to snore and has fallen asleep at work and at the wheel. She has previously seen Dr. Vickey Huger in our office for her HAs and was last seen by her in 8/11. She is trying to stay physically active to be able to stay awake. She has woken up with feeling very cold, shivering. She has had recurrent HAs.  She has been able to lose weight by cutting carbs and was contemplating gastric bypass surgery, but has decided to not go through with it.  Her typical bedtime is reported to be around 10:30 PM and usual wake time is around 5:30 AM. Sleep onset typically occurs within 15-20 minutes. She reports feeling marginally rested upon awakening. She wakes up on an average 2 to 3 times in the middle of the night and has to go to the bathroom 0 to 1 times on a typical night. She admits to occasional morning headaches. She used to shake herself to sleep, but has stopped.  She reports excessive daytime somnolence (EDS) and Her Epworth Sleepiness Score (ESS) is 23/24 today. She has fallen asleep while driving. The patient has not been taking a planned nap, but has fallen asleep inadvertently. She denies dreaming in a nap and reports feeling refreshed after a nap.  She has been known to snore for the past many years. Snoring is reportedly  moderate, and associated with choking sounds. The patient denies a sense of choking or strangling feeling, but wakes up with palpitations. There is report of nighttime reflux, with occasional nighttime cough experienced. The patient has not noted any RLS symptoms and is not known to kick while asleep or before falling asleep. There is family history of OSA in a MA.  She is a restless sleeper and in the morning, the bed is quite disheveled.   She denies cataplexy, sleep paralysis, hypnagogic or hypnopompic hallucinations, or sleep attacks. She does not report any vivid dreams, nightmares, dream enactments, or parasomnias, such as sleep talking or sleep walking. The patient has not had a sleep study or a home sleep test.  She consumes 0 caffeinated beverages per day, usually 8 oz per week.   Her bedroom is usually dark and cool. There is a TV in the bedroom and usually it is not on at night.   Her Past Medical History Is Significant For: Past Medical History  Diagnosis Date  . Polycystic ovary disease   . Prolactinoma   . Pituitary tumor     Her Past Surgical History Is Significant For: Past Surgical History  Procedure Laterality Date  . Tubal ligation      right fallopian tube removed    Her Family History Is Significant For: Family History  Problem Relation Age of Onset  . Hypertension Father   . Hypertension Mother     Her Social History Is Significant  For: History   Social History  . Marital Status: Married    Spouse Name: N/A    Number of Children: 1  . Years of Education: N/A   Occupational History  . LPN    Social History Main Topics  . Smoking status: Never Smoker   . Smokeless tobacco: None  . Alcohol Use: No  . Drug Use: No  . Sexual Activity: None   Other Topics Concern  . None   Social History Narrative   Lives with husband and eighteen year.      Her Allergies Are:  Allergies  Allergen Reactions  . Stadol [Butorphanol Tartrate] Itching    dizziness   :   Her Current Medications Are:  Outpatient Encounter Prescriptions as of 06/05/2013  Medication Sig Dispense Refill  . bromocriptine (PARLODEL) 2.5 MG tablet Take 2.5 mg by mouth Nightly.      . etonogestrel-ethinyl estradiol (NUVARING) 0.12-0.015 MG/24HR vaginal ring Place 1 each vaginally every 28 (twenty-eight) days. Insert vaginally and leave in place for 3 consecutive weeks, then remove for 1 week.      . metFORMIN (GLUCOPHAGE-XR) 500 MG 24 hr tablet Take 500 mg by mouth Nightly.      . calcium-vitamin D (OSCAL WITH D) 500-200 MG-UNIT per tablet Take 1 tablet by mouth daily.      . Chlorphen-Pseudoephed-APAP (THERAFLU FLU/COLD PO) Take 1 packet by mouth daily as needed (flu symptoms).      . fish oil-omega-3 fatty acids 1000 MG capsule Take 1 g by mouth daily.      Marland Kitchen glucosamine-chondroitin 500-400 MG tablet Take 1 tablet by mouth daily.      Marland Kitchen ibuprofen (ADVIL,MOTRIN) 200 MG tablet Take 200 mg by mouth every 6 (six) hours as needed for pain.      . Multiple Vitamins-Minerals (MULTIVITAMIN WITH MINERALS) tablet Take 1 tablet by mouth daily.      . predniSONE (DELTASONE) 20 MG tablet Take 2 tablets (40 mg total) by mouth daily.  10 tablet  0  . pseudoephedrine-acetaminophen (TYLENOL SINUS) 30-500 MG TABS Take 1 tablet by mouth every 4 (four) hours as needed.      . [DISCONTINUED] amoxicillin (AMOXIL) 500 MG capsule Take 1 capsule (500 mg total) by mouth 3 (three) times daily.  30 capsule  0  . [DISCONTINUED] lidocaine (XYLOCAINE) 2 % solution Take 20 mLs by mouth as needed for pain (pain).       No facility-administered encounter medications on file as of 06/05/2013.   Review of Systems:  Out of a complete 14 point review of systems, all are reviewed and negative with the exception of these symptoms as listed below:   Review of Systems  Constitutional: Positive for fatigue.  Respiratory:       Snoring  Endocrine: Positive for cold intolerance.  Musculoskeletal: Positive for  arthralgias.  Allergic/Immunologic: Positive for environmental allergies.  Neurological: Positive for headaches.    Objective:  Neurologic Exam  Physical Exam Physical Examination:   Filed Vitals:   06/05/13 1518  BP: 130/83  Pulse: 68  Temp: 98.4 F (36.9 C)   General Examination: The patient is a very pleasant 37 y.o. female in no acute distress. She appears well-developed and well-nourished and well groomed. She is obese  HEENT: Normocephalic, atraumatic, pupils are equal, round and reactive to light and accommodation. Funduscopic exam is normal with sharp disc margins noted. Extraocular tracking is good without limitation to gaze excursion or nystagmus noted. Normal smooth pursuit is noted. Hearing  is grossly intact. Tympanic membranes are clear bilaterally. Face is symmetric with normal facial animation and normal facial sensation. Speech is clear with no dysarthria noted. There is no hypophonia. There is no lip, neck/head, jaw or voice tremor. Neck is supple with full range of passive and active motion. There are no carotid bruits on auscultation. Oropharynx exam reveals: mild mouth dryness, good dental hygiene and mild airway crowding, due to large tongue. Mallampati is class III. Tongue protrudes centrally and palate elevates symmetrically. Tonsils are 1+. Neck size is 14.75 inches.   Chest: Clear to auscultation without wheezing, rhonchi or crackles noted.  Heart: S1+S2+0, regular and normal without murmurs, rubs or gallops noted.   Abdomen: Soft, non-tender and non-distended with normal bowel sounds appreciated on auscultation.  Extremities: There is no pitting edema in the distal lower extremities bilaterally. Pedal pulses are intact.  Skin: Warm and dry without trophic changes noted. There are no varicose veins.  Musculoskeletal: exam reveals no obvious joint deformities, tenderness or joint swelling or erythema.   Neurologically:  Mental status: The patient is awake,  alert and oriented in all 4 spheres. Her memory, attention, language and knowledge are appropriate. There is no aphasia, agnosia, apraxia or anomia. Speech is clear with normal prosody and enunciation. Thought process is linear. Mood is congruent and affect is normal.  Cranial nerves are as described above under HEENT exam. In addition, shoulder shrug is normal with equal shoulder height noted. Motor exam: Normal bulk, strength and tone is noted. There is no drift, tremor or rebound. Romberg is negative. Reflexes are 2+ throughout. Toes are downgoing bilaterally. Fine motor skills are intact with normal finger taps, normal hand movements, normal rapid alternating patting, normal foot taps and normal foot agility.  Cerebellar testing shows no dysmetria or intention tremor on finger to nose testing. Heel to shin is unremarkable bilaterally. There is no truncal or gait ataxia.  Sensory exam is intact to light touch, pinprick, vibration, temperature sense and proprioception in the upper and lower extremities.  Gait, station and balance are unremarkable. No veering to one side is noted. No leaning to one side is noted. Posture is age-appropriate and stance is narrow based. No problems turning are noted. She turns en bloc. Tandem walk is unremarkable. Intact toe and heel stance is noted.               Assessment and Plan:   In summary, Sarah Saunders is a very pleasant 37 y.o.-year old female with a history and physical exam concerning for obstructive sleep apnea (OSA), associated with severe daytime somnolence. Her history is not suggestive of narcolepsy. I had a long chat with the patient about my findings and the diagnosis, its prognosis and treatment options. We talked about medical treatments and non-pharmacological approaches. I explained in particular the risks and ramifications of untreated moderate to severe OSA, especially with respect to developing cardiovascular disease down the Road, including  congestive heart failure, difficult to treat hypertension, cardiac arrhythmias, or stroke. Even type 2 diabetes has in part been linked to untreated OSA. We talked about trying to maintain a healthy lifestyle in general, as well as the importance of weight control. She has been able to lose weight in the realm of 20 pounds since August. I encouraged the patient to eat healthy, exercise daily and keep well hydrated, to keep a scheduled bedtime and wake time routine, to not skip any meals and eat healthy snacks in between meals.  I recommended the  following at this time: sleep study with potential positive airway pressure titration.  I explained the sleep test procedure to the patient and also outlined possible surgical and non-surgical treatment options of OSA, including the use of a custom-made dental device, upper airway surgical options, such as pillar implants, radiofrequency surgery, tongue base surgery, and UPPP. I also explained the CPAP treatment option to the patient, who indicated that she would be willing to try CPAP if the need arises. I explained the importance of being compliant with PAP treatment, not only for insurance purposes but primarily to improve Her symptoms, and for the patient's long term health benefit, including to reduce Her cardiovascular risks. I answered all her questions today and the patient was in agreement. I would like to see her back after the sleep study is completed and encouraged her to call with any interim questions, concerns, problems or updates.   Thank you very much for allowing me to participate in the care of this nice patient. If I can be of any further assistance to you please do not hesitate to call me at [phone].  Sincerely,   Huston Foley, MD, PhD

## 2013-06-05 NOTE — Patient Instructions (Signed)

## 2013-06-25 ENCOUNTER — Ambulatory Visit (INDEPENDENT_AMBULATORY_CARE_PROVIDER_SITE_OTHER): Payer: BC Managed Care – PPO

## 2013-06-25 DIAGNOSIS — G4733 Obstructive sleep apnea (adult) (pediatric): Secondary | ICD-10-CM

## 2013-06-25 DIAGNOSIS — G4761 Periodic limb movement disorder: Secondary | ICD-10-CM

## 2013-06-25 DIAGNOSIS — R400 Somnolence: Secondary | ICD-10-CM

## 2013-06-25 DIAGNOSIS — E282 Polycystic ovarian syndrome: Secondary | ICD-10-CM

## 2013-07-09 ENCOUNTER — Telehealth: Payer: Self-pay | Admitting: Neurology

## 2013-07-09 ENCOUNTER — Encounter: Payer: Self-pay | Admitting: *Deleted

## 2013-07-09 NOTE — Telephone Encounter (Signed)
Please call and notify the patient that the recent sleep study did not show any significant obstructive sleep apnea. Please inform patient that I would like to go over the details of the study during a follow up appointment and if not already previously scheduled, arrange a followup appointment (please utilize a followu-up slot). Also, route or fax report to PCP and referring MD, if other than PCP.  Once you have spoken to patient, you can close this encounter.   Thanks,  Dequan Kindred, MD, PhD [facility]  

## 2013-07-09 NOTE — Telephone Encounter (Signed)
I called and left a message for the patient that her recent sleep study did not show any significant obstructive sleep apnea and Dr. Frances Furbish would like for to schedule a follow visit, so she may cover her study in detail.

## 2013-07-10 ENCOUNTER — Telehealth: Payer: Self-pay | Admitting: Neurology

## 2013-07-10 NOTE — Telephone Encounter (Signed)
I called and left a message for the to callback to schedule a f/u with Dr. Frances Furbish.

## 2014-01-05 ENCOUNTER — Encounter (HOSPITAL_COMMUNITY): Payer: Self-pay | Admitting: Emergency Medicine

## 2014-01-05 ENCOUNTER — Emergency Department (HOSPITAL_COMMUNITY): Payer: BC Managed Care – PPO

## 2014-01-05 ENCOUNTER — Emergency Department (HOSPITAL_COMMUNITY)
Admission: EM | Admit: 2014-01-05 | Discharge: 2014-01-05 | Disposition: A | Discharge status: Home / Self Care | Payer: BC Managed Care – PPO | Attending: Emergency Medicine | Admitting: Emergency Medicine

## 2014-01-05 DIAGNOSIS — Y92410 Unspecified street and highway as the place of occurrence of the external cause: Secondary | ICD-10-CM | POA: Insufficient documentation

## 2014-01-05 DIAGNOSIS — Y9389 Activity, other specified: Secondary | ICD-10-CM | POA: Insufficient documentation

## 2014-01-05 DIAGNOSIS — Z79899 Other long term (current) drug therapy: Secondary | ICD-10-CM | POA: Insufficient documentation

## 2014-01-05 DIAGNOSIS — S335XXA Sprain of ligaments of lumbar spine, initial encounter: Secondary | ICD-10-CM | POA: Insufficient documentation

## 2014-01-05 DIAGNOSIS — S39012A Strain of muscle, fascia and tendon of lower back, initial encounter: Secondary | ICD-10-CM

## 2014-01-05 DIAGNOSIS — S161XXA Strain of muscle, fascia and tendon at neck level, initial encounter: Secondary | ICD-10-CM

## 2014-01-05 DIAGNOSIS — Z8742 Personal history of other diseases of the female genital tract: Secondary | ICD-10-CM | POA: Insufficient documentation

## 2014-01-05 DIAGNOSIS — S139XXA Sprain of joints and ligaments of unspecified parts of neck, initial encounter: Secondary | ICD-10-CM | POA: Insufficient documentation

## 2014-01-05 DIAGNOSIS — Z862 Personal history of diseases of the blood and blood-forming organs and certain disorders involving the immune mechanism: Secondary | ICD-10-CM | POA: Insufficient documentation

## 2014-01-05 DIAGNOSIS — Z8639 Personal history of other endocrine, nutritional and metabolic disease: Secondary | ICD-10-CM | POA: Insufficient documentation

## 2014-01-05 MED ORDER — IBUPROFEN 800 MG PO TABS
800.0000 mg | ORAL_TABLET | Freq: Once | ORAL | Status: AC
  Administered 2014-01-05: 800 mg via ORAL
  Filled 2014-01-05: qty 1

## 2014-01-05 MED ORDER — HYDROCODONE-ACETAMINOPHEN 5-325 MG PO TABS: 1.0000 | ORAL_TABLET | ORAL | Status: DC | PRN

## 2014-01-05 MED ORDER — CYCLOBENZAPRINE HCL 10 MG PO TABS: 10.0000 mg | ORAL_TABLET | Freq: Two times a day (BID) | ORAL | Status: DC | PRN

## 2014-01-05 MED ORDER — METHOCARBAMOL 500 MG PO TABS
750.0000 mg | ORAL_TABLET | Freq: Once | ORAL | Status: AC
  Administered 2014-01-05: 750 mg via ORAL
  Filled 2014-01-05: qty 2

## 2014-01-05 NOTE — ED Provider Notes (Signed)
Medical screening examination/treatment/procedure(s) were performed by non-physician practitioner and as supervising physician I was immediately available for consultation/collaboration.   EKG Interpretation None        Wandra Arthurs, MD 01/05/14 2308

## 2014-01-05 NOTE — Discharge Instructions (Signed)
Cervical Sprain A cervical sprain is an injury in the neck in which the strong, fibrous tissues (ligaments) that connect your neck bones stretch or tear. Cervical sprains can range from mild to severe. Severe cervical sprains can cause the neck vertebrae to be unstable. This can lead to damage of the spinal cord and can result in serious nervous system problems. The amount of time it takes for a cervical sprain to get better depends on the cause and extent of the injury. Most cervical sprains heal in 1 to 3 weeks. CAUSES  Severe cervical sprains may be caused by:   Contact sport injuries (such as from football, rugby, wrestling, hockey, auto racing, gymnastics, diving, martial arts, or boxing).   Motor vehicle collisions.   Whiplash injuries. This is an injury from a sudden forward-and backward whipping movement of the head and neck.  Falls.  Mild cervical sprains may be caused by:   Being in an awkward position, such as while cradling a telephone between your ear and shoulder.   Sitting in a chair that does not offer proper support.   Working at a poorly Landscape architect station.   Looking up or down for long periods of time.  SYMPTOMS   Pain, soreness, stiffness, or a burning sensation in the front, back, or sides of the neck. This discomfort may develop immediately after the injury or slowly, 24 hours or more after the injury.   Pain or tenderness directly in the middle of the back of the neck.   Shoulder or upper back pain.   Limited ability to move the neck.   Headache.   Dizziness.   Weakness, numbness, or tingling in the hands or arms.   Muscle spasms.   Difficulty swallowing or chewing.   Tenderness and swelling of the neck.  DIAGNOSIS  Most of the time your health care provider can diagnose a cervical sprain by taking your history and doing a physical exam. Your health care provider will ask about previous neck injuries and any known neck  problems, such as arthritis in the neck. X-rays may be taken to find out if there are any other problems, such as with the bones of the neck. Other tests, such as a CT scan or MRI, may also be needed.  TREATMENT  Treatment depends on the severity of the cervical sprain. Mild sprains can be treated with rest, keeping the neck in place (immobilization), and pain medicines. Severe cervical sprains are immediately immobilized. Further treatment is done to help with pain, muscle spasms, and other symptoms and may include:  Medicines, such as pain relievers, numbing medicines, or muscle relaxants.   Physical therapy. This may involve stretching exercises, strengthening exercises, and posture training. Exercises and improved posture can help stabilize the neck, strengthen muscles, and help stop symptoms from returning.  HOME CARE INSTRUCTIONS   Put ice on the injured area.   Put ice in a plastic bag.   Place a towel between your skin and the bag.   Leave the ice on for 15 20 minutes, 3 4 times a day.   If your injury was severe, you may have been given a cervical collar to wear. A cervical collar is a two-piece collar designed to keep your neck from moving while it heals.  Do not remove the collar unless instructed by your health care provider.  If you have long hair, keep it outside of the collar.  Ask your health care provider before making any adjustments to your collar.  Minor adjustments may be required over time to improve comfort and reduce pressure on your chin or on the back of your head.  Ifyou are allowed to remove the collar for cleaning or bathing, follow your health care provider's instructions on how to do so safely.  Keep your collar clean by wiping it with mild soap and water and drying it completely. If the collar you have been given includes removable pads, remove them every 1 2 days and hand wash them with soap and water. Allow them to air dry. They should be completely  dry before you wear them in the collar.  If you are allowed to remove the collar for cleaning and bathing, wash and dry the skin of your neck. Check your skin for irritation or sores. If you see any, tell your health care provider.  Do not drive while wearing the collar.   Only take over-the-counter or prescription medicines for pain, discomfort, or fever as directed by your health care provider.   Keep all follow-up appointments as directed by your health care provider.   Keep all physical therapy appointments as directed by your health care provider.   Make any needed adjustments to your workstation to promote good posture.   Avoid positions and activities that make your symptoms worse.   Warm up and stretch before being active to help prevent problems.  SEEK MEDICAL CARE IF:   Your pain is not controlled with medicine.   You are unable to decrease your pain medicine over time as planned.   Your activity level is not improving as expected.  SEEK IMMEDIATE MEDICAL CARE IF:   You develop any bleeding.  You develop stomach upset.  You have signs of an allergic reaction to your medicine.   Your symptoms get worse.   You develop new, unexplained symptoms.   You have numbness, tingling, weakness, or paralysis in any part of your body.  MAKE SURE YOU:   Understand these instructions.  Will watch your condition.  Will get help right away if you are not doing well or get worse. Document Released: 06/04/2007 Document Revised: 05/28/2013 Document Reviewed: 02/12/2013 [facility] Patient Information 2014 [place].  Lumbosacral Strain Lumbosacral strain is a strain of any of the parts that make up your lumbosacral vertebrae. Your lumbosacral vertebrae are the bones that make up the lower third of your backbone. Your lumbosacral vertebrae are held together by muscles and tough, fibrous tissue (ligaments).  CAUSES  A sudden blow to your back can cause  lumbosacral strain. Also, anything that causes an excessive stretch of the muscles in the low back can cause this strain. This is typically seen when people exert themselves strenuously, fall, lift heavy objects, bend, or crouch repeatedly. RISK FACTORS  Physically demanding work.  Participation in pushing or pulling sports or sports that require sudden twist of the back (tennis, golf, baseball).  Weight lifting.  Excessive lower back curvature.  Forward-tilted pelvis.  Weak back or abdominal muscles or both.  Tight hamstrings. SIGNS AND SYMPTOMS  Lumbosacral strain may cause pain in the area of your injury or pain that moves (radiates) down your leg.  DIAGNOSIS Your health care provider can often diagnose lumbosacral strain through a physical exam. In some cases, you may need tests such as X-ray exams.  TREATMENT  Treatment for your lower back injury depends on many factors that your clinician will have to evaluate. However, most treatment will include the use of anti-inflammatory medicines. HOME CARE INSTRUCTIONS  Avoid hard physical activities (tennis, racquetball, waterskiing) if you are not in proper physical condition for it. This may aggravate or create problems.  If you have a back problem, avoid sports requiring sudden body movements. Swimming and walking are generally safer activities.  Maintain good posture.  Maintain a healthy weight.  For acute conditions, you may put ice on the injured area.  Put ice in a plastic bag.  Place a towel between your skin and the bag.  Leave the ice on for 20 minutes, 2 3 times a day.  When the low back starts healing, stretching and strengthening exercises may be recommended. SEEK MEDICAL CARE IF:  Your back pain is getting worse.  You experience severe back pain not relieved with medicines. SEEK IMMEDIATE MEDICAL CARE IF:   You have numbness, tingling, weakness, or problems with the use of your arms or legs.  There is a  change in bowel or bladder control.  You have increasing pain in any area of the body, including your belly (abdomen).  You notice shortness of breath, dizziness, or feel faint.  You feel sick to your stomach (nauseous), are throwing up (vomiting), or become sweaty.  You notice discoloration of your toes or legs, or your feet get very cold. MAKE SURE YOU:   Understand these instructions.  Will watch your condition.  Will get help right away if you are not doing well or get worse. Document Released: 05/17/2005 Document Revised: 05/28/2013 Document Reviewed: 03/26/2013 [facility] Patient Information 2014 [place], [place].
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
## 2014-12-15 ENCOUNTER — Emergency Department
Admit: 2014-12-15 | Disposition: A | Discharge status: Home / Self Care | Payer: Self-pay | Admitting: Emergency Medicine

## 2014-12-15 LAB — URINALYSIS, COMPLETE
Bilirubin,UR: NEGATIVE
Blood: NEGATIVE
GLUCOSE, UR: NEGATIVE mg/dL (ref 0–75)
Ketone: NEGATIVE
Nitrite: NEGATIVE
PROTEIN: NEGATIVE
Ph: 7 (ref 4.5–8.0)
SPECIFIC GRAVITY: 1.004 (ref 1.003–1.030)

## 2014-12-15 LAB — CBC WITH DIFFERENTIAL/PLATELET
BASOS PCT: 0.8 %
Basophil #: 0 10*3/uL (ref 0.0–0.1)
Eosinophil #: 0.1 10*3/uL (ref 0.0–0.7)
Eosinophil %: 2.7 %
HCT: 34.7 % — ABNORMAL LOW (ref 35.0–47.0)
HGB: 11 g/dL — ABNORMAL LOW (ref 12.0–16.0)
Lymphocyte #: 1.7 10*3/uL (ref 1.0–3.6)
Lymphocyte %: 37.9 %
MCH: 25.8 pg — ABNORMAL LOW (ref 26.0–34.0)
MCHC: 31.7 g/dL — ABNORMAL LOW (ref 32.0–36.0)
MCV: 81 fL (ref 80–100)
MONO ABS: 0.3 x10 3/mm (ref 0.2–0.9)
Monocyte %: 6.3 %
NEUTROS PCT: 52.3 %
Neutrophil #: 2.4 10*3/uL (ref 1.4–6.5)
Platelet: 215 10*3/uL (ref 150–440)
RBC: 4.26 10*6/uL (ref 3.80–5.20)
RDW: 14.3 % (ref 11.5–14.5)
WBC: 4.6 10*3/uL (ref 3.6–11.0)

## 2014-12-15 LAB — COMPREHENSIVE METABOLIC PANEL
ALK PHOS: 73 U/L
ALT: 18 U/L
Albumin: 3.5 g/dL
Anion Gap: 5 — ABNORMAL LOW (ref 7–16)
BUN: 12 mg/dL
Bilirubin,Total: 0.4 mg/dL
CALCIUM: 8.7 mg/dL — AB
CREATININE: 0.65 mg/dL
Chloride: 108 mmol/L
Co2: 25 mmol/L
EGFR (Non-African Amer.): >60
Glucose: 87 mg/dL
Potassium: 3.6 mmol/L
SGOT(AST): 26 U/L
SODIUM: 138 mmol/L
Total Protein: 6.7 g/dL

## 2014-12-15 LAB — LIPASE, BLOOD: Lipase: 24 U/L

## 2014-12-15 LAB — TROPONIN I: Troponin-I: <0.03 ng/mL

## 2015-11-02 ENCOUNTER — Other Ambulatory Visit (HOSPITAL_COMMUNITY): Payer: Self-pay | Admitting: Internal Medicine

## 2015-11-02 DIAGNOSIS — R109 Unspecified abdominal pain: Secondary | ICD-10-CM

## 2015-11-04 ENCOUNTER — Ambulatory Visit (HOSPITAL_COMMUNITY)
Admission: RE | Admit: 2015-11-04 | Discharge: 2015-11-04 | Disposition: A | Discharge status: Home / Self Care | Payer: BLUE CROSS/BLUE SHIELD | Source: Ambulatory Visit | Attending: Internal Medicine | Admitting: Internal Medicine

## 2015-11-04 DIAGNOSIS — R109 Unspecified abdominal pain: Secondary | ICD-10-CM | POA: Diagnosis present

## 2015-11-04 DIAGNOSIS — R110 Nausea: Secondary | ICD-10-CM | POA: Diagnosis not present

## 2015-11-07 ENCOUNTER — Emergency Department (HOSPITAL_COMMUNITY)
Admission: EM | Admit: 2015-11-07 | Discharge: 2015-11-07 | Disposition: A | Discharge status: Home / Self Care | Payer: BLUE CROSS/BLUE SHIELD | Attending: Emergency Medicine | Admitting: Emergency Medicine

## 2015-11-07 ENCOUNTER — Encounter (HOSPITAL_COMMUNITY): Payer: Self-pay | Admitting: Emergency Medicine

## 2015-11-07 ENCOUNTER — Emergency Department (HOSPITAL_COMMUNITY): Payer: BLUE CROSS/BLUE SHIELD

## 2015-11-07 DIAGNOSIS — R0789 Other chest pain: Secondary | ICD-10-CM | POA: Diagnosis not present

## 2015-11-07 DIAGNOSIS — R110 Nausea: Secondary | ICD-10-CM

## 2015-11-07 DIAGNOSIS — Z8639 Personal history of other endocrine, nutritional and metabolic disease: Secondary | ICD-10-CM | POA: Diagnosis not present

## 2015-11-07 DIAGNOSIS — Z86018 Personal history of other benign neoplasm: Secondary | ICD-10-CM | POA: Diagnosis not present

## 2015-11-07 DIAGNOSIS — D649 Anemia, unspecified: Secondary | ICD-10-CM | POA: Diagnosis not present

## 2015-11-07 DIAGNOSIS — Z79899 Other long term (current) drug therapy: Secondary | ICD-10-CM | POA: Insufficient documentation

## 2015-11-07 DIAGNOSIS — K279 Peptic ulcer, site unspecified, unspecified as acute or chronic, without hemorrhage or perforation: Secondary | ICD-10-CM | POA: Insufficient documentation

## 2015-11-07 DIAGNOSIS — Z7984 Long term (current) use of oral hypoglycemic drugs: Secondary | ICD-10-CM | POA: Insufficient documentation

## 2015-11-07 DIAGNOSIS — R079 Chest pain, unspecified: Secondary | ICD-10-CM | POA: Diagnosis present

## 2015-11-07 DIAGNOSIS — K2970 Gastritis, unspecified, without bleeding: Secondary | ICD-10-CM | POA: Diagnosis not present

## 2015-11-07 LAB — BASIC METABOLIC PANEL
Anion gap: 10 (ref 5–15)
BUN: 11 mg/dL (ref 6–20)
CO2: 20 mmol/L — ABNORMAL LOW (ref 22–32)
Calcium: 9.1 mg/dL (ref 8.9–10.3)
Chloride: 107 mmol/L (ref 101–111)
Creatinine, Ser: 0.7 mg/dL (ref 0.44–1.00)
Glucose, Bld: 112 mg/dL — ABNORMAL HIGH (ref 65–99)
POTASSIUM: 4.2 mmol/L (ref 3.5–5.1)
Sodium: 137 mmol/L (ref 135–145)

## 2015-11-07 LAB — CBC
HCT: 34.2 % — ABNORMAL LOW (ref 36.0–46.0)
Hemoglobin: 11 g/dL — ABNORMAL LOW (ref 12.0–15.0)
MCH: 24.4 pg — ABNORMAL LOW (ref 26.0–34.0)
MCHC: 32.2 g/dL (ref 30.0–36.0)
MCV: 76 fL — ABNORMAL LOW (ref 78.0–100.0)
Platelets: 283 10*3/uL (ref 150–400)
RBC: 4.5 MIL/uL (ref 3.87–5.11)
RDW: 15.2 % (ref 11.5–15.5)
WBC: 7.1 10*3/uL (ref 4.0–10.5)

## 2015-11-07 LAB — I-STAT TROPONIN, ED
Troponin i, poc: 0 ng/mL (ref 0.00–0.08)
Troponin i, poc: 0 ng/mL (ref 0.00–0.08)

## 2015-11-07 MED ORDER — GI COCKTAIL ~~LOC~~
30.0000 mL | Freq: Once | ORAL | Status: AC
  Administered 2015-11-07: 30 mL via ORAL
  Filled 2015-11-07: qty 30

## 2015-11-07 MED ORDER — ONDANSETRON 8 MG PO TBDP: 8.0000 mg | ORAL_TABLET | Freq: Three times a day (TID) | ORAL | Status: DC | PRN

## 2015-11-07 NOTE — ED Notes (Addendum)
Pt states today she began having intermittent chest pain radiating into back, neck, and jaw. Recently seen here for possible gallbladder problems. Also c/o nausea. Denies SOB, weakness, dizziness. Pt no longer hurting at this time, states he comes and goes throughout the day.

## 2015-11-07 NOTE — ED Provider Notes (Signed)
CSN: [id number]     Arrival date & time 11/07/15  1648 History   First MD Initiated Contact with Patient 11/07/15 1849     Chief Complaint  Patient presents with  . Chest Pain     (Consider location/radiation/quality/duration/timing/severity/associated sxs/prior Treatment) HPI Comments: Sarah Saunders is a 40 y.o. female with a PMHx of PCOS (on metformin), prolactinoma, and migraines (on diltiazem), and a PSHx of R fallopian tube removal, who presents to the ED with complaints of chest pain that began while at rest this morning around 9 AM, lasting 5 minutes, and again occurring at 5 PM lasting 20 minutes. She describes the pain as initially 10/10 sharp intermittent central chest pain radiating around the sides of her ribs into her back and left jaw, worse with movement, with no treatments tried prior to arrival, and unrelieved with position changes. It completely resolved just after she arrived here and she is currently asymptomatic. Associated symptoms included nausea which also resolved. She notes that a week and half ago she had some upper abdominal pain, nausea, and discolored stools for which she underwent an abdominal ultrasound and lab work at her PCP's office which was all unremarkable, the symptoms resolved last week and she has not yet followed up with her regular doctor. She is taking AcipHex for ?gastritis/PUD.   She denies any fevers, chills, diaphoresis, lightheadedness, leg swelling, recent travel/surgery/immobilization, OCPs, history of DVT/PE, shortness breath, abdominal pain, ongoing nausea, vomiting, diarrhea, constipation, dysuria, hematuria, vaginal bleeding, numbness, tingling, weakness, claudication, orthopnea. She is a nonsmoker. Positive family history of MI in her maternal aunt but no other CAD/MI in other family members. She has no personal history of diabetes, hypertension, or hyperlipidemia.  Patient is a 40 y.o. female presenting with chest pain. The history is  provided by the patient and medical records. No language interpreter was used.  Chest Pain Pain location:  Substernal area Pain quality: sharp   Pain radiates to:  Mid back, L jaw and epigastrium Pain radiates to the back: yes   Pain severity:  Severe Onset quality:  Gradual Duration: 9am lasting 5 minutes, 5pm lasting 20 minutes. Timing:  Intermittent Progression:  Resolved Chronicity:  New Context: at rest   Relieved by:  Nothing Worsened by:  Movement Ineffective treatments:  Certain positions Associated symptoms: nausea   Associated symptoms: no abdominal pain, no claudication, no diaphoresis, no fever, no lower extremity edema, no numbness, no orthopnea, no shortness of breath, not vomiting and no weakness   Risk factors: no birth control, no coronary artery disease, no diabetes mellitus, no high cholesterol, no hypertension, no immobilization, no prior DVT/PE, no smoking and no surgery     Past Medical History  Diagnosis Date  . Polycystic ovary disease   . Prolactinoma ([place])   . 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 Tubal ligation      right fallopian tube removed  . Tube removed     Family History  Problem Relation Age of Onset  . Hypertension Father   . Hypertension Mother    Social History  Substance Use Topics  . Smoking status: Never Smoker   . Smokeless tobacco: Never Used  . Alcohol Use: No   OB History    No data available     Review of Systems  Constitutional: Negative for fever, chills and diaphoresis.  Respiratory: Negative for shortness of breath.   Cardiovascular: Positive for chest pain. Negative for orthopnea, claudication  and leg swelling.  Gastrointestinal: Positive for nausea. Negative for vomiting, abdominal pain, diarrhea and constipation.  Genitourinary: Negative for dysuria, hematuria and vaginal bleeding.  Musculoskeletal: Negative for myalgias and arthralgias.  Skin: Negative for color change.   Allergic/Immunologic: Negative for immunocompromised state.  Neurological: Negative for weakness, light-headedness and numbness.  Psychiatric/Behavioral: Negative for confusion.   10 Systems reviewed and are negative for acute change except as noted in the HPI.    Allergies  Butorphanol tartrate and Stadol  Home Medications   Prior to Admission medications   Medication Sig Start Date End Date Taking? Authorizing Provider  albuterol (PROVENTIL HFA;VENTOLIN HFA) 108 (90 BASE) MCG/ACT inhaler Inhale 2 puffs into the lungs every 4 (four) hours as needed for wheezing or shortness of breath.   Yes Historical Provider, MD  bromocriptine (PARLODEL) 2.5 MG tablet Take 2.5 mg by mouth at bedtime.    Yes Historical Provider, MD  cetirizine (ZYRTEC) 10 MG tablet Take 10 mg by mouth at bedtime.   Yes Historical Provider, MD  cholecalciferol (VITAMIN D) 1000 UNITS tablet Take 1,000 Units by mouth every morning.   Yes Historical Provider, MD  diltiazem (CARTIA XT) 120 MG 24 hr capsule Take 120 mg by mouth daily.   Yes Historical Provider, MD  folic acid (FOLVITE) Q000111Q MCG tablet Take 800 mcg by mouth 2 (two) times daily.   Yes Historical Provider, MD  Inositol-D Chiro-Inositol (OVASITOL) 2000-50 MG PACK Take 1 tablet by mouth 2 (two) times daily.   Yes Historical Provider, MD  metFORMIN (GLUCOPHAGE) 500 MG tablet Take 500 mg by mouth 2 (two) times daily with a meal.   Yes Historical Provider, MD  metFORMIN (GLUCOPHAGE-XR) 500 MG 24 hr tablet Take 750 mg by mouth 2 (two) times daily.   Yes Historical Provider, MD  montelukast (SINGULAIR) 10 MG tablet Take 10 mg by mouth at bedtime.   Yes Historical Provider, MD  sertraline (ZOLOFT) 100 MG tablet Take 100 mg by mouth daily.   Yes Historical Provider, MD   BP 131/79 mmHg  Pulse 90  Temp(Src) 98.2 F (36.8 C) (Oral)  Resp 20  SpO2 100%  LMP 10/22/2015 Physical Exam  Constitutional: She is oriented to person, place, and time. Vital signs are normal.  She appears well-developed and well-nourished.  Non-toxic appearance. No distress.  Afebrile, nontoxic, NAD  HENT:  Head: Normocephalic and atraumatic.  Mouth/Throat: Oropharynx is clear and moist and mucous membranes are normal.  Eyes: Conjunctivae and EOM are normal. Right eye exhibits no discharge. Left eye exhibits no discharge.  Neck: Normal range of motion. Neck supple.  Cardiovascular: Normal rate, regular rhythm, normal heart sounds and intact distal pulses.  Exam reveals no gallop and no friction rub.   No murmur heard. RRR, nl s1/s2, no m/r/g, distal pulses intact, no pedal edema   Pulmonary/Chest: Effort normal and breath sounds normal. No respiratory distress. She has no decreased breath sounds. She has no wheezes. She has no rhonchi. She has no rales. She exhibits tenderness. She exhibits no crepitus, no deformity and no retraction.    CTAB in all lung fields, no w/r/r, no hypoxia or increased WOB, speaking in full sentences, SpO2 100% on RA  Chest wall with minimal TTP over the sternum and epigastrum, without crepitus, deformities, or retractions   Abdominal: Soft. Normal appearance and bowel sounds are normal. She exhibits no distension. There is tenderness in the epigastric area. There is no rigidity, no rebound, no guarding, no CVA tenderness, no tenderness at  McBurney's point and negative Murphy's sign.    Soft, nondistended, +BS throughout, with mild epigastric TTP, no r/g/r, neg murphy's, neg mcburney's, no CVA TTP   Musculoskeletal: Normal range of motion.  MAE x4 Strength and sensation grossly intact Distal pulses intact No pedal edema  Neurological: She is alert and oriented to person, place, and time. She has normal strength. No sensory deficit.  Skin: Skin is warm, dry and intact. No rash noted.  Psychiatric: She has a normal mood and affect.  Nursing note and vitals reviewed.   ED Course  Procedures (including critical care time) Labs Review Labs Reviewed    BASIC METABOLIC PANEL - Abnormal; Notable for the following:    CO2 20 (*)    Glucose, Bld 112 (*)    All other components within normal limits  CBC - Abnormal; Notable for the following:    Hemoglobin 11.0 (*)    HCT 34.2 (*)    MCV 76.0 (*)    MCH 24.4 (*)    All other components within normal limits  I-STAT TROPOININ, ED  Randolm Idol, ED    Imaging Review Dg Chest 2 View  11/07/2015  CLINICAL DATA:  Chest and back pain EXAM: CHEST  2 VIEW COMPARISON:  12/16/2011 FINDINGS: Normal heart size and mediastinal contours. No acute infiltrate or edema. No effusion or pneumothorax. Spondylosis. No acute osseous findings. IMPRESSION: No active cardiopulmonary disease. Electronically Signed   By: Monte Fantasia M.D.   On: 11/07/2015 18:15   Study Result U/S abdomen 11/04/15    CLINICAL DATA: 40 year old female with abdominal pain, nausea and discolored light stools.  EXAM: ABDOMEN ULTRASOUND COMPLETE  COMPARISON: No priors.  FINDINGS: Gallbladder: No gallstones or wall thickening visualized. No sonographic Murphy sign noted by sonographer.  Common bile duct: Diameter: 3.4 mm  Liver: No focal lesion identified. Within normal limits in parenchymal echogenicity.  IVC: No abnormality visualized.  Pancreas: Visualized portion unremarkable.  Spleen: Size and appearance within normal limits. 11.7 cm  Right Kidney: Length: 12.4 cm. Echogenicity within normal limits. No mass or hydronephrosis visualized.  Left Kidney: Length: 12.7 cm. Echogenicity within normal limits. No mass or hydronephrosis visualized.  Abdominal aorta: No aneurysm visualized.  Other findings: None.  IMPRESSION: 1. No acute findings in the abdomen to account for the patient's symptoms. 2. Normal abdominal ultrasound.   Electronically Signed  By: Vinnie Langton M.D.  On: 11/04/2015 08:48    I have personally reviewed and evaluated these images and lab results as part of  my medical decision-making.   EKG Interpretation   Date/Time:  Sunday November 07 2015 16:54:56 EDT Ventricular Rate:  92 PR Interval:  142 QRS Duration: 90 QT Interval:  341 QTC Calculation: 422 R Axis:   73 Text Interpretation:  Sinus rhythm Borderline T wave abnormalities  Confirmed by Hazle Coca [phone]) on 11/07/2015 7:25:45 PM      MDM   Final diagnoses:  Atypical chest pain  PUD (peptic ulcer disease)  Gastritis  Nausea  Chronic anemia    40 y.o. female here with CP that occurred at 9am and then again at 5pm, associated nausea, currently resolved. Has had some upper abd pain/n/v over the last 1wk for which she had an U/S that was negative, presumably they were looking for gallbladder etiologies per the pt's account. On exam, reproducible tenderness in the epigastrum and substernal area, neg murphy's. No tachycardia or hypoxia, no pleuritic component, PERC neg, doubt PE. Trop WNL at 5:40pm, EKG without acute ischemic  changes, CXR clear, CBC with stable anemia, BMP pending. Will give GI cocktail and repeat trop at 8:40pm, overall pt is low-risk HEART score 2 and does not have a concerning story, doubt cardiac etiology and doubt need for admission, likely GI component to it (?PUD). Will hold off on ASA, NTG, and morphine since pt doesn't have ongoing pain, but will give GI cocktail. Pt declines wanting nausea meds since this resolved. Will reassess shortly  8:02 PM BMP unremarkable. GI cocktail given with no change in symptoms, although she had no symptoms when she took it; no recurrent symptoms at this time. Will await repeat trop at 8:40pm then recheck  10:29 PM Unfortunately there were delays in getting second trop done, but it now resulted and is WNL. Due to low-risk HEART score, and overall reassuring exam/vitals/labs/imaging, doubt need for admission. This is likely GI related, discussed continuing her aciphex, take zantac in addition, and use OTC tums/maalox PRN additional  relief. Use tylenol for pain. Will send home with zofran for nausea. Diet modifications discussed. F/up with her PCP at the visit this week for ongoing management. I explained the diagnosis and have given explicit precautions to return to the ER including for any other new or worsening symptoms. The patient understands and accepts the medical plan as it's been dictated and I have answered their questions. Discharge instructions concerning home care and prescriptions have been given. The patient is STABLE and is discharged to home in good condition.  BP 113/79 mmHg  Pulse 64  Temp(Src) 98.2 F (36.8 C) (Oral)  Resp 15  SpO2 99%  LMP 10/22/2015  Meds ordered this encounter  Medications  . gi cocktail (Maalox,Lidocaine,Donnatal)    Sig:   . ondansetron (ZOFRAN ODT) 8 MG disintegrating tablet    Sig: Take 1 tablet (8 mg total) by mouth every 8 (eight) hours as needed for nausea or vomiting.    Dispense:  10 tablet    Refill:  0    Order Specific Question:  Supervising Provider    Answer:  Noemi Chapel [[id number]]     Adyson Vanburen Camprubi-Soms, PA-C 11/07/15 [place], MD 11/08/15 2119

## 2015-11-07 NOTE — Discharge Instructions (Signed)
Your chest and abdominal pain is likely from gastritis or an ulcer. You will need to take your home aciphex as directed, and you can consider adding on over-the-counter zantac as needed for additional relief. Avoid spicy/fatty/acidic foods, avoid soda/coffee/tea. Avoid laying down flat within 30 minutes of eating. Avoid NSAIDs like ibuprofen/aleve/motrin/etc on an empty stomach. May consider using over the counter tums/maalox as needed for additional relief. Use zofran as directed as needed for nausea. Use tylenol as needed for pain. Follow up with your regular doctor at your already scheduled visit this week for ongoing evaluation of your abdominal pain. Return to the ER for changes or worsening symptoms.  Abdominal (belly) pain can be caused by many things. Your caregiver performed an examination and possibly ordered blood/urine tests and imaging (CT scan, x-rays, ultrasound). Many cases can be observed and treated at home after initial evaluation in the emergency department. Even though you are being discharged home, abdominal pain can be unpredictable. Therefore, you need a repeated exam if your pain does not resolve, returns, or worsens. Most patients with abdominal pain don't have to be admitted to the hospital or have surgery, but serious problems like appendicitis and gallbladder attacks can start out as nonspecific pain. Many abdominal conditions cannot be diagnosed in one visit, so follow-up evaluations are very important. SEEK IMMEDIATE MEDICAL ATTENTION IF YOU DEVELOP ANY OF THE FOLLOWING SYMPTOMS:  The pain does not go away or becomes severe.   A temperature above 101 develops.   Repeated vomiting occurs (multiple episodes).   The pain becomes localized to portions of the abdomen. The right side could possibly be appendicitis. In an adult, the left lower portion of the abdomen could be colitis or diverticulitis.   Blood is being passed in stools or vomit (bright red or black tarry stools).     Return also if you develop chest pain, difficulty breathing, dizziness or fainting, or become confused, poorly responsive, or inconsolable (young children).  The constipation stays for more than 4 days.   There is belly (abdominal) or rectal pain.   You do not seem to be getting better.      Nonspecific Chest Pain  Chest pain can be caused by many different conditions. There is always a chance that your pain could be related to something serious, such as a heart attack or a blood clot in your lungs. Chest pain can also be caused by conditions that are not life-threatening. If you have chest pain, it is very important to follow up with your health care provider. CAUSES  Chest pain can be caused by:  Heartburn.  Pneumonia or bronchitis.  Anxiety or stress.  Inflammation around your heart (pericarditis) or lung (pleuritis or pleurisy).  A blood clot in your lung.  A collapsed lung (pneumothorax). It can develop suddenly on its own (spontaneous pneumothorax) or from trauma to the chest.  Shingles infection (varicella-zoster virus).  Heart attack.  Damage to the bones, muscles, and cartilage that make up your chest wall. This can include:  Bruised bones due to injury.  Strained muscles or cartilage due to frequent or repeated coughing or overwork.  Fracture to one or more ribs.  Sore cartilage due to inflammation (costochondritis). RISK FACTORS  Risk factors for chest pain may include:  Activities that increase your risk for trauma or injury to your chest.  Respiratory infections or conditions that cause frequent coughing.  Medical conditions or overeating that can cause heartburn.  Heart disease or family history of heart  disease.  Conditions or health behaviors that increase your risk of developing a blood clot.  Having had chicken pox (varicella zoster). SIGNS AND SYMPTOMS Chest pain can feel like:  Burning or tingling on the surface of your chest or deep in  your chest.  Crushing, pressure, aching, or squeezing pain.  Dull or sharp pain that is worse when you move, cough, or take a deep breath.  Pain that is also felt in your back, neck, shoulder, or arm, or pain that spreads to any of these areas. Your chest pain may come and go, or it may stay constant. DIAGNOSIS Lab tests or other studies may be needed to find the cause of your pain. Your health care provider may have you take a test called an ambulatory ECG (electrocardiogram). An ECG records your heartbeat patterns at the time the test is performed. You may also have other tests, such as:  Transthoracic echocardiogram (TTE). During echocardiography, sound waves are used to create a picture of all of the heart structures and to look at how blood flows through your heart.  Transesophageal echocardiogram (TEE).This is a more advanced imaging test that obtains images from inside your body. It allows your health care provider to see your heart in finer detail.  Cardiac monitoring. This allows your health care provider to monitor your heart rate and rhythm in real time.  Holter monitor. This is a portable device that records your heartbeat and can help to diagnose abnormal heartbeats. It allows your health care provider to track your heart activity for several days, if needed.  Stress tests. These can be done through exercise or by taking medicine that makes your heart beat more quickly.  Blood tests.  Imaging tests. TREATMENT  Your treatment depends on what is causing your chest pain. Treatment may include:  Medicines. These may include:  Acid blockers for heartburn.  Anti-inflammatory medicine.  Pain medicine for inflammatory conditions.  Antibiotic medicine, if an infection is present.  Medicines to dissolve blood clots.  Medicines to treat coronary artery disease.  Supportive care for conditions that do not require medicines. This may include:  Resting.  Applying heat or  cold packs to injured areas.  Limiting activities until pain decreases. HOME CARE INSTRUCTIONS  If you were prescribed an antibiotic medicine, finish it all even if you start to feel better.  Avoid any activities that bring on chest pain.  Do not use any tobacco products, including cigarettes, chewing tobacco, or electronic cigarettes. If you need help quitting, ask your health care provider.  Do not drink alcohol.  Take medicines only as directed by your health care provider.  Keep all follow-up visits as directed by your health care provider. This is important. This includes any further testing if your chest pain does not go away.  If heartburn is the cause for your chest pain, you may be told to keep your head raised (elevated) while sleeping. This reduces the chance that acid will go from your stomach into your esophagus.  Make lifestyle changes as directed by your health care provider. These may include:  Getting regular exercise. Ask your health care provider to suggest some activities that are safe for you.  Eating a heart-healthy diet. A registered dietitian can help you to learn healthy eating options.  Maintaining a healthy weight.  Managing diabetes, if necessary.  Reducing stress. SEEK MEDICAL CARE IF:  Your chest pain does not go away after treatment.  You have a rash with blisters on your  chest.  You have a fever. SEEK IMMEDIATE MEDICAL CARE IF:   Your chest pain is worse.  You have an increasing cough, or you cough up blood.  You have severe abdominal pain.  You have severe weakness.  You faint.  You have chills.  You have sudden, unexplained chest discomfort.  You have sudden, unexplained discomfort in your arms, back, neck, or jaw.  You have shortness of breath at any time.  You suddenly start to sweat, or your skin gets clammy.  You feel nauseous or you vomit.  You suddenly feel light-headed or dizzy.  Your heart begins to beat quickly,  or it feels like it is skipping beats. These symptoms may represent a serious problem that is an emergency. Do not wait to see if the symptoms will go away. Get medical help right away. Call your local emergency services (911 in the [place] Do not drive yourself to the hospital.   This information is not intended to replace advice given to you by your health care provider. Make sure you discuss any questions you have with your health care provider.   Document Released: 05/17/2005 Document Revised: 08/28/2014 Document Reviewed: 03/13/2014 Elsevier Interactive Patient Education 2016 Elsevier Inc[employer]  Nausea, Adult Nausea is the feeling that you have an upset stomach or have to vomit. Nausea by itself is not likely a serious concern, but it may be an early sign of more serious medical problems. As nausea gets worse, it can lead to vomiting. If vomiting develops, there is the risk of dehydration.  CAUSES   Viral infections.  Food poisoning.  Medicines.  Pregnancy.  Motion sickness.  Migraine headaches.  Emotional distress.  Severe pain from any source.  Alcohol intoxication. HOME CARE INSTRUCTIONS  Get plenty of rest.  Ask your caregiver about specific rehydration instructions.  Eat small amounts of food and sip liquids more often.  Take all medicines as told by your caregiver. SEEK MEDICAL CARE IF:  You have not improved after 2 days, or you get worse.  You have a headache. SEEK IMMEDIATE MEDICAL CARE IF:   You have a fever.  You faint.  You keep vomiting or have blood in your vomit.  You are extremely weak or dehydrated.  You have dark or bloody stools.  You have severe chest or abdominal pain. MAKE SURE YOU:  Understand these instructions.  Will watch your condition.  Will get help right away if you are not doing well or get worse.   This information is not intended to replace advice given to you by your health care provider. Make sure you discuss any  questions you have with your health care provider.   Document Released: 09/14/2004 Document Revised: 08/28/2014 Document Reviewed: 04/19/2011 Elsevier Interactive Patient Education 2016 Elsevier Inc.  Gastritis, Adult Gastritis is soreness and swelling (inflammation) of the lining of the stomach. Gastritis can develop as a sudden onset (acute) or long-term (chronic) condition. If gastritis is not treated, it can lead to stomach bleeding and ulcers. CAUSES  Gastritis occurs when the stomach lining is weak or damaged. Digestive juices from the stomach then inflame the weakened stomach lining. The stomach lining may be weak or damaged due to viral or bacterial infections. One common bacterial infection is the Helicobacter pylori infection. Gastritis can also result from excessive alcohol consumption, taking certain medicines, or having too much acid in the stomach.  SYMPTOMS  In some cases, there are no symptoms. When symptoms are present, they may include:  Pain  or a burning sensation in the upper abdomen.  Nausea.  Vomiting.  An uncomfortable feeling of fullness after eating. DIAGNOSIS  Your caregiver may suspect you have gastritis based on your symptoms and a physical exam. To determine the cause of your gastritis, your caregiver may perform the following:  Blood or stool tests to check for the H pylori bacterium.  Gastroscopy. A thin, flexible tube (endoscope) is passed down the esophagus and into the stomach. The endoscope has a light and camera on the end. Your caregiver uses the endoscope to view the inside of the stomach.  Taking a tissue sample (biopsy) from the stomach to examine under a microscope. TREATMENT  Depending on the cause of your gastritis, medicines may be prescribed. If you have a bacterial infection, such as an H pylori infection, antibiotics may be given. If your gastritis is caused by too much acid in the stomach, H2 blockers or antacids may be given. Your  caregiver may recommend that you stop taking aspirin, ibuprofen, or other nonsteroidal anti-inflammatory drugs (NSAIDs). HOME CARE INSTRUCTIONS  Only take over-the-counter or prescription medicines as directed by your caregiver.  If you were given antibiotic medicines, take them as directed. Finish them even if you start to feel better.  Drink enough fluids to keep your urine clear or pale yellow.  Avoid foods and drinks that make your symptoms worse, such as:  Caffeine or alcoholic drinks.  Chocolate.  Peppermint or mint flavorings.  Garlic and onions.  Spicy foods.  Citrus fruits, such as oranges, lemons, or limes.  Tomato-based foods such as sauce, chili, salsa, and pizza.  Fried and fatty foods.  Eat small, frequent meals instead of large meals. SEEK IMMEDIATE MEDICAL CARE IF:   You have black or dark red stools.  You vomit blood or material that looks like coffee grounds.  You are unable to keep fluids down.  Your abdominal pain gets worse.  You have a fever.  You do not feel better after 1 week.  You have any other questions or concerns. MAKE SURE YOU:  Understand these instructions.  Will watch your condition.  Will get help right away if you are not doing well or get worse.   This information is not intended to replace advice given to you by your health care provider. Make sure you discuss any questions you have with your health care provider.   Document Released: 08/01/2001 Document Revised: 02/06/2012 Document Reviewed: 09/20/2011 Elsevier Interactive Patient Education 2016 [employer] for Peptic Ulcer Disease When you have peptic ulcer disease, the foods you eat and your eating habits are very important. Choosing the right foods can help ease the discomfort of peptic ulcer disease. WHAT GENERAL GUIDELINES DO I NEED TO FOLLOW?  Choose fruits, vegetables, whole grains, and low-fat meat, fish, and poultry.   Keep a food diary to  identify foods that cause symptoms.  Avoid foods that cause irritation or pain. These may be different for different people.  Eat frequent small meals instead of three large meals each day. The pain may be worse when your stomach is empty.  Avoid eating close to bedtime. WHAT FOODS ARE NOT RECOMMENDED? The following are some foods and drinks that may worsen your symptoms:  Black, white, and red pepper.  Hot sauce.  Chili peppers.  Chili powder.  Chocolate and cocoa.   Alcohol.  Tea, coffee, and cola (regular and decaffeinated). The items listed above may not be a complete list of foods and  beverages to avoid. Contact your dietitian for more information.   This information is not intended to replace advice given to you by your health care provider. Make sure you discuss any questions you have with your health care provider.   Document Released: 10/30/2011 Document Revised: 08/12/2013 Document Reviewed: 06/11/2013 Elsevier Interactive Patient Education [employer].

## 2016-02-23 ENCOUNTER — Ambulatory Visit: Payer: BLUE CROSS/BLUE SHIELD | Admitting: Podiatry

## 2016-08-22 ENCOUNTER — Other Ambulatory Visit: Payer: Self-pay | Admitting: Surgical Oncology

## 2016-09-20 ENCOUNTER — Ambulatory Visit
Admission: RE | Admit: 2016-09-20 | Discharge: 2016-09-20 | Disposition: A | Discharge status: Home / Self Care | Payer: Managed Care, Other (non HMO) | Source: Ambulatory Visit | Attending: Surgical Oncology | Admitting: Surgical Oncology

## 2016-10-05 ENCOUNTER — Encounter: Payer: BLUE CROSS/BLUE SHIELD | Admitting: Neurology

## 2016-10-09 ENCOUNTER — Ambulatory Visit (INDEPENDENT_AMBULATORY_CARE_PROVIDER_SITE_OTHER): Payer: Self-pay | Admitting: Neurology

## 2016-10-09 ENCOUNTER — Encounter: Payer: Self-pay | Admitting: Neurology

## 2016-10-09 ENCOUNTER — Ambulatory Visit (INDEPENDENT_AMBULATORY_CARE_PROVIDER_SITE_OTHER): Payer: 59 | Admitting: Neurology

## 2016-10-09 DIAGNOSIS — E663 Overweight: Secondary | ICD-10-CM

## 2016-10-09 DIAGNOSIS — M25552 Pain in left hip: Secondary | ICD-10-CM

## 2016-10-09 HISTORY — DX: Morbid (severe) obesity due to excess calories: E66.01

## 2016-10-09 NOTE — Progress Notes (Signed)
Please refer to EMG and nerve conduction study evaluation. 

## 2016-10-09 NOTE — Procedures (Signed)
     HISTORY:  Sarah Saunders is a 41 year old patient with a history of morbid obesity and diabetes who reports left greater than right hip discomfort that is greater with weightbearing. The patient is being evaluated for a possible neuropathy or radiculopathy.  NERVE CONDUCTION STUDIES:  Nerve conduction studies were performed on both lower extremities. The distal motor latencies and motor amplitudes for the peroneal and posterior tibial nerves were within normal limits. The nerve conduction velocities for these nerves were also normal. The H reflex latencies normal. The sensory latencies for the peroneal nerves were within normal limits.   EMG STUDIES:  EMG study was performed on the left lower extremity:  The tibialis anterior muscle reveals 2 to 4K motor units with full recruitment. No fibrillations or positive waves were seen. The peroneus tertius muscle reveals 2 to 4K motor units with full recruitment. No fibrillations or positive waves were seen. The medial gastrocnemius muscle reveals 1 to 3K motor units with full recruitment. No fibrillations or positive waves were seen. The vastus lateralis muscle reveals 2 to 4K motor units with full recruitment. No fibrillations or positive waves were seen. The iliopsoas muscle reveals 2 to 4K motor units with full recruitment. No fibrillations or positive waves were seen. The biceps femoris muscle (long head) reveals 2 to 4K motor units with full recruitment. No fibrillations or positive waves were seen. The lumbosacral paraspinal muscles were tested at 3 levels, and revealed no abnormalities of insertional activity at all 3 levels tested. There was good relaxation.   IMPRESSION:  Nerve conduction studies done on both lower extremities were within normal limits. No evidence of a neuropathy is seen. EMG evaluation of the left lower extremity was unremarkable, without evidence of a radiculopathy or evidence of a myopathic disorder.  Jill Alexanders MD 10/09/2016 11:15 AM  [facility] [address] [place], [place] [address]  Phone [phone] Fax [phone]

## 2016-11-19 ENCOUNTER — Encounter: Payer: Self-pay | Admitting: Hematology

## 2016-11-19 ENCOUNTER — Telehealth: Payer: Self-pay | Admitting: Hematology

## 2016-11-19 NOTE — Telephone Encounter (Signed)
Pt returned my call to schedule an appt w/Dr. Irene Limbo on 4/25 at 3pm. Pt aware to arrive 30 minutes early. An earlier appt was offered, but pt unable to make it to the appt offered.  Demographics verified. Letter mailed and faxed to the referring.

## 2016-12-13 ENCOUNTER — Encounter: Payer: Self-pay | Admitting: Hematology

## 2016-12-13 ENCOUNTER — Telehealth: Payer: Self-pay | Admitting: Hematology

## 2016-12-13 ENCOUNTER — Ambulatory Visit (HOSPITAL_BASED_OUTPATIENT_CLINIC_OR_DEPARTMENT_OTHER): Payer: Managed Care, Other (non HMO) | Admitting: Hematology

## 2016-12-13 ENCOUNTER — Ambulatory Visit (HOSPITAL_BASED_OUTPATIENT_CLINIC_OR_DEPARTMENT_OTHER): Payer: Managed Care, Other (non HMO)

## 2016-12-13 VITALS — BP 136/82 | HR 84 | Temp 98.0°F | Resp 18 | Ht 68.0 in | Wt 315.2 lb

## 2016-12-13 DIAGNOSIS — E538 Deficiency of other specified B group vitamins: Secondary | ICD-10-CM

## 2016-12-13 DIAGNOSIS — D509 Iron deficiency anemia, unspecified: Secondary | ICD-10-CM

## 2016-12-13 DIAGNOSIS — D508 Other iron deficiency anemias: Secondary | ICD-10-CM

## 2016-12-13 LAB — COMPREHENSIVE METABOLIC PANEL
ALBUMIN: 3.6 g/dL (ref 3.5–5.0)
ALK PHOS: 99 U/L (ref 40–150)
ALT: 13 U/L (ref 0–55)
ANION GAP: 10 meq/L (ref 3–11)
AST: 16 U/L (ref 5–34)
BUN: 12.1 mg/dL (ref 7.0–26.0)
CALCIUM: 9.6 mg/dL (ref 8.4–10.4)
CO2: 24 mEq/L (ref 22–29)
CREATININE: 0.7 mg/dL (ref 0.6–1.1)
Chloride: 107 mEq/L (ref 98–109)
EGFR: >90 mL/min/{1.73_m2} (ref >90)
Glucose: 84 mg/dl (ref 70–140)
POTASSIUM: 4 meq/L (ref 3.5–5.1)
Sodium: 140 mEq/L (ref 136–145)
Total Bilirubin: 0.22 mg/dL (ref 0.20–1.20)
Total Protein: 7.1 g/dL (ref 6.4–8.3)

## 2016-12-13 LAB — CBC & DIFF AND RETIC
BASO%: 0.4 % (ref 0.0–2.0)
BASOS ABS: 0 10*3/uL (ref 0.0–0.1)
EOS ABS: 0.2 10*3/uL (ref 0.0–0.5)
EOS%: 2.4 % (ref 0.0–7.0)
HEMATOCRIT: 33 % — AB (ref 34.8–46.6)
HEMOGLOBIN: 10.3 g/dL — AB (ref 11.6–15.9)
Immature Retic Fract: 10.6 % — ABNORMAL HIGH (ref 1.60–10.00)
LYMPH%: 37.2 % (ref 14.0–49.7)
MCH: 23.5 pg — ABNORMAL LOW (ref 25.1–34.0)
MCHC: 31.2 g/dL — ABNORMAL LOW (ref 31.5–36.0)
MCV: 75.3 fL — ABNORMAL LOW (ref 79.5–101.0)
MONO#: 0.5 10*3/uL (ref 0.1–0.9)
MONO%: 6.4 % (ref 0.0–14.0)
NEUT#: 3.9 10*3/uL (ref 1.5–6.5)
NEUT%: 53.6 % (ref 38.4–76.8)
PLATELETS: 259 10*3/uL (ref 145–400)
RBC: 4.38 10*6/uL (ref 3.70–5.45)
RDW: 16.3 % — AB (ref 11.2–14.5)
RETIC %: 1.34 % (ref 0.70–2.10)
Retic Ct Abs: 58.69 10*3/uL (ref 33.70–90.70)
WBC: 7.2 10*3/uL (ref 3.9–10.3)
lymph#: 2.7 10*3/uL (ref 0.9–3.3)

## 2016-12-13 MED ORDER — B COMPLEX VITAMINS PO CAPS: 1.0000 | ORAL_CAPSULE | Freq: Every day | ORAL | Status: DC

## 2016-12-13 NOTE — Patient Instructions (Signed)
Thank you for choosing [facility] to provide your oncology and hematology care.  To afford each patient quality time with our providers, please arrive 30 minutes before your scheduled appointment time.  If you arrive late for your appointment, you may be asked to reschedule.  We strive to give you quality time with our providers, and arriving late affects you and other patients whose appointments are after yours.  If you are a no show for multiple scheduled visits, you may be dismissed from the clinic at the providers discretion.   Again, thank you for choosing [facility], our hope is that these requests will decrease the amount of time that you wait before being seen by our physicians.  ______________________________________________________________________ Should you have questions after your visit to the [facility], please contact our office at [phone] between the hours of 8:30 and 4:30 p.m.    Voicemails left after 4:30p.m will not be returned until the following business day.   For prescription refill requests, please have your pharmacy contact us directly.  Please also try to allow 48 hours for prescription requests.   Please contact the scheduling department for questions regarding scheduling.  For scheduling of procedures such as PET scans, CT scans, MRI, Ultrasound, etc please contact central scheduling at [phone].   Resources For Cancer Patients and Caregivers:  American Cancer Society:  800-227-2345  Can help patients locate various types of support and financial assistance Cancer Care: 1-800-813-HOPE (4673) Provides financial assistance, online support groups, medication/co-pay assistance.   [place] DSS:  336-641-3447 Where to apply for food stamps, Medicaid, and utility assistance Medicare Rights Center: 800-333-4114 Helps people with Medicare understand their rights and benefits, navigate the Medicare system, and secure the  quality healthcare they deserve SCAT: 336-333-6589 [place] [employer]'s shared-ride transportation service for eligible riders who have a disability that prevents them from riding the fixed route bus.   For additional information on assistance programs please contact our social worker:   Grier Hock/Abigail Elmore:  [phone] 

## 2016-12-13 NOTE — Telephone Encounter (Signed)
Gave patient AVS and calender per 4/25 los.  

## 2016-12-13 NOTE — Progress Notes (Signed)
Marland Kitchen    HEMATOLOGY/ONCOLOGY CONSULTATION NOTE  Date of Service: 12/13/2016  Patient Care Team: Merrilee Seashore, MD as PCP - General (Internal Medicine) Dr Chalmers Cater MD (Endocrinology) Dr Delsa Bern MD (Gyn) Dr Cephus Richer (Bariatric Surgery)  CHIEF COMPLAINTS/PURPOSE OF CONSULTATION:  Iron deficiency Anemia not responsive to PO iron and needing correction prior to planned Bariatric surgery.  HISTORY OF PRESENTING ILLNESS:   Sarah Saunders is a wonderful 41 y.o. female who has been referred to [place] by Dr .Merrilee Seashore, MD  for evaluation and management of iron deficiency anemia not responsive to oral iron therapy.   Patient has a history of prolactinoma, polycystic ovarian disease, morbid obesity, previous history of iron deficiency, GERD on chronic PPI therapy who is undergoing evaluation for bariatric surgery with Dr. Eldridge Abrahams at [facility].  As part of her pre-bariatric surgery evaluation (plan for duodenal switch surgery) she was noted to have zinc deficiency, iron deficiency. She notes that she has been on ferrous sulfate 1 tablet by mouth twice a day since December 2017 with no improvement in her hemoglobin or ferritin levels.    She notes that she has been on AcipHex for "years". She notes no overt GI bleeding. Notes that she has had no menstrual losses for about 5 months and that her periods have been fairly irregular due to her PCOS.  She works as a Marine scientist in a dialysis unit and notes that she has been significantly fatigued. Recent labs with her primary care physician  from 10/30/2016 showed a hemoglobin of 9.5 with an MCV of 77 elevated RDW of 18.5 with normal W BC count of 11.2k and platelets of 312k. Iron studies were not available in referable information that she had an iron saturation of about 8% earlier this year.  No other acute new symptoms at this time. She notes that her bariatric medicine team has told her that they cannot proceed for  surgical planning until her anemia and iron deficiency are completely corrected.  She notes that she is on zinc and copper replacement as per her bariatric medicine team.  Has not had any overt evidence of B12 deficiency.  Currently he is taking ferrous sulfate 1 tablet by mouth twice a day with vitamin C without improvement in her anemia. Patient notes that she has never required previous blood transfusions or IV iron.  We discussed about the role of IV iron replacement in details and the possible etiologies for her iron deficiency anemia including poor absorption due to chronic PPI therapy. She has been on high doses of ibuprofen for one acute pains and is also on SSRI and we discussed that this combination could significantly increase her risk of GI bleeding. She was recommended to discuss this with her primary care physician.  MEDICAL HISTORY:  Past Medical History:  Diagnosis Date  . Morbid obesity ([place]) 10/09/2016  . Pituitary tumor   . Polycystic ovary disease   . Prolactinoma ([place])   History of migraines   irregular periods Possible sleep apnea not on CPAP machine Depression Impaired fasting glucose Mild reactive airway disease   SURGICAL HISTORY: Past Surgical History:  Procedure Laterality Date  . TUBAL LIGATION     right fallopian tube removed  . tube removed      SOCIAL HISTORY: Social History   Social History  . Marital status: Married    Spouse name: N/A  . Number of children: 1  . Years of education: N/A   Occupational History  . LPN  [employer]   Social History Main Topics  . Smoking status: Never Smoker  . Smokeless tobacco: Never Used  . Alcohol use No  . Drug use: No  . Sexual activity: Not on file   Other Topics Concern  . Not on file   Social History Narrative   Lives with husband and eighteen year.      FAMILY HISTORY: Family History  Problem Relation Age of Onset  . Hypertension Father   . Hypertension Mother     ALLERGIES:   is allergic to butorphanol tartrate and stadol [butorphanol tartrate].  MEDICATIONS:  Current Outpatient Prescriptions  Medication Sig Dispense Refill  . albuterol (PROVENTIL HFA;VENTOLIN HFA) 108 (90 BASE) MCG/ACT inhaler Inhale 2 puffs into the lungs every 4 (four) hours as needed for wheezing or shortness of breath.    . bromocriptine (PARLODEL) 2.5 MG tablet Take 2.5 mg by mouth at bedtime.     . cetirizine (ZYRTEC) 10 MG tablet Take 10 mg by mouth at bedtime.    . cholecalciferol (VITAMIN D) 1000 UNITS tablet Take 1,000 Units by mouth every morning.    . diltiazem (CARTIA XT) 120 MG 24 hr capsule Take 120 mg by mouth daily.    . folic acid (FOLVITE) 001 MCG tablet Take 800 mcg by mouth 2 (two) times daily.    Blanchie Dessert Chiro-Inositol (OVASITOL) 2000-50 MG PACK Take 1 tablet by mouth 2 (two) times daily.    . metFORMIN (GLUCOPHAGE) 500 MG tablet Take 500 mg by mouth 2 (two) times daily with a meal.    . metFORMIN (GLUCOPHAGE-XR) 500 MG 24 hr tablet Take 750 mg by mouth 2 (two) times daily.    . montelukast (SINGULAIR) 10 MG tablet Take 10 mg by mouth at bedtime.    . ondansetron (ZOFRAN ODT) 8 MG disintegrating tablet Take 1 tablet (8 mg total) by mouth every 8 (eight) hours as needed for nausea or vomiting. 10 tablet 0  . sertraline (ZOLOFT) 100 MG tablet Take 100 mg by mouth daily.     No current facility-administered medications for this visit.     REVIEW OF SYSTEMS:    10 Point review of Systems was done is negative except as noted above.  PHYSICAL EXAMINATION: ECOG PERFORMANCE STATUS: 1 - Symptomatic but completely ambulatory  . Vitals:   12/13/16 1457  BP: 136/82  Pulse: 84  Resp: 18  Temp: 98 F (36.7 C)   Filed Weights   12/13/16 1457  Weight: (!) 315 lb 3.2 oz (143 kg)   .Body mass index is 47.93 kg/m.  GENERAL:alert, in no acute distress and comfortable SKIN: no acute rashes, no significant lesions EYES: conjunctiva are pink and non-injected, sclera  anicteric OROPHARYNX: MMM, no exudates, no oropharyngeal erythema or ulceration NECK: supple, no JVD LYMPH:  no palpable lymphadenopathy in the cervical, axillary or inguinal regions LUNGS: clear to auscultation b/l with normal respiratory effort HEART: regular rate & rhythm ABDOMEN:  obese, distant  normoactive bowel sounds , non tender, not distended. Extremity: no pedal edema PSYCH: alert & oriented x 3 with fluent speech NEURO: no focal motor/sensory deficits  LABORATORY DATA:  I have reviewed the data as listed  . CBC Latest Ref Rng & Units 12/13/2016 11/07/2015 12/15/2014  WBC 3.9 - 10.3 10e3/uL 7.2 7.1 4.6  Hemoglobin 11.6 - 15.9 g/dL 10.3(L) 11.0(L) 11.0(L)  Hematocrit 34.8 - 46.6 % 33.0(L) 34.2(L) 34.7(L)  Platelets 145 - 400 10e3/uL 259 283 215   . CBC  Component Value Date/Time   WBC 7.2 12/13/2016 1552   WBC 7.1 11/07/2015 1730   RBC 4.38 12/13/2016 1552   RBC 4.50 11/07/2015 1730   HGB 10.3 (L) 12/13/2016 1552   HCT 33.0 (L) 12/13/2016 1552   PLT 259 12/13/2016 1552   MCV 75.3 (L) 12/13/2016 1552   MCH 23.5 (L) 12/13/2016 1552   MCH 24.4 (L) 11/07/2015 1730   MCHC 31.2 (L) 12/13/2016 1552   MCHC 32.2 11/07/2015 1730   RDW 16.3 (H) 12/13/2016 1552   LYMPHSABS 2.7 12/13/2016 1552   MONOABS 0.5 12/13/2016 1552   EOSABS 0.2 12/13/2016 1552   EOSABS 0.1 12/15/2014 0951   BASOSABS 0.0 12/13/2016 1552    . CMP Latest Ref Rng & Units 12/13/2016 11/07/2015 12/15/2014  Glucose 70 - 140 mg/dl 84 112(H) 87  BUN 7.0 - 26.0 mg/dL 12.1 11 12   Creatinine 0.6 - 1.1 mg/dL 0.7 0.70 0.65  Sodium 136 - 145 mEq/L 140 137 138  Potassium 3.5 - 5.1 mEq/L 4.0 4.2 3.6  Chloride 101 - 111 mmol/L - 107 108  CO2 22 - 29 mEq/L 24 20(L) 25  Calcium 8.4 - 10.4 mg/dL 9.6 9.1 8.7(L)  Total Protein 6.4 - 8.3 g/dL 7.1 - 6.7  Total Bilirubin 0.20 - 1.20 mg/dL 0.22 - 0.4  Alkaline Phos 40 - 150 U/L 99 - 73  AST 5 - 34 U/L 16 - 26  ALT 0 - 55 U/L 13 - 18    Ferritin and Iron profile  pending from today.  RADIOGRAPHIC STUDIES: I have personally reviewed the radiological images as listed and agreed with the findings in the report. No results found.  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 This could be due to chronic PPI therapy. No overt symptomatology suggestive of gluten intolerance. No overt GI bleeding and she notes that she hasn't had any menstrual losses in the last 5 months due to in frequent periods from her polycystic ovarian syndrome .  Cannot rule out slow GI bleeding due to high-dose NSAID and SSRI use . Recommended to discuss this with her primary care physician . Plan -We'll get baseline labs today including a CBC, CMP, ferritin, iron profile, B12, RBC folate. -Patient needs to have her anemia and iron deficiency aggressively corrected since this is a prerequisite for her to be able to have bariatric surgery . -Patient is aware that she might have increased iron deficiency after bariatric surgery and might continue to need IV iron replacement in addition to other vitamin replacements . -She is agreeable to IV iron replacement after discussing the pros and cons and possible adverse effects . -We will set her up for IV injectafer 750 mg every week day 2 doses. -Patient will take a cetirizine or Claritin from home prior to IV iron to reduce risk of allergic reactions . -She will continue zinc and copper replacement as per her bariatric medicine team . -She was recommended to take B complex one capsule daily to support accelerated hematopoiesis . -She will change her ferrous sulfate to iron polysaccharide 150 mg by mouth twice a day with orange juice to assess tolerance and have higher daily iron replacement orally since this might be needed post gastric bypass surgery as well. -We shall see her back in 8 weeks with repeat labs to assess response . Goal is to try to get her ferritin level more  than 100 .  Labs  today IV Injectafer 750mg  weekly x 2 doses ASAP -- patient to have surgery. RTC with Dr Irene Limbo in 8 weeks with labs  All of the patients questions were answered with apparent satisfaction. The patient knows to call the clinic with any problems, questions or concerns.  I spent 45 minutes counseling the patient face to face. The total time spent in the appointment was 60 minutes and more than 50% was on counseling and direct patient cares.    Sullivan Lone MD [place] AAHIVMS [facility] Hematology/Oncology Physician [facility]  (Office):       [phone] (Work cell):  [phone] (Fax):           [phone]  12/13/2016 2:55 PM

## 2016-12-14 LAB — VITAMIN B12: VITAMIN B 12: 460 pg/mL (ref 232–1245)

## 2016-12-14 LAB — IRON AND TIBC
%SAT: 9 % — AB (ref 21–57)
IRON: 29 ug/dL — AB (ref 41–142)
TIBC: 331 ug/dL (ref 236–444)
UIBC: 302 ug/dL (ref 120–384)

## 2016-12-14 LAB — FERRITIN: FERRITIN: 37 ng/mL (ref 9–269)

## 2016-12-14 LAB — FOLATE RBC
Folate, Hemolysate: 414.9 ng/mL
Folate, RBC: 1269 ng/mL (ref >498)
Hematocrit: 32.7 % — ABNORMAL LOW (ref 34.0–46.6)

## 2016-12-19 ENCOUNTER — Ambulatory Visit (HOSPITAL_BASED_OUTPATIENT_CLINIC_OR_DEPARTMENT_OTHER): Payer: Managed Care, Other (non HMO)

## 2016-12-19 VITALS — BP 131/69 | HR 78 | Temp 98.5°F | Resp 17

## 2016-12-19 DIAGNOSIS — D509 Iron deficiency anemia, unspecified: Secondary | ICD-10-CM | POA: Diagnosis not present

## 2016-12-19 DIAGNOSIS — D508 Other iron deficiency anemias: Secondary | ICD-10-CM

## 2016-12-19 MED ORDER — SODIUM CHLORIDE 0.9 % IV SOLN
750.0000 mg | Freq: Once | INTRAVENOUS | Status: AC
  Administered 2016-12-19: 750 mg via INTRAVENOUS
  Filled 2016-12-19: qty 15

## 2016-12-19 NOTE — Patient Instructions (Signed)
Ferric carboxymaltose injection What is this medicine? FERRIC CARBOXYMALTOSE (ferr-ik car-box-ee-mol-toes) is an iron complex. Iron is used to make healthy red blood cells, which carry oxygen and nutrients throughout the body. This medicine is used to treat anemia in people with chronic kidney disease or people who cannot take iron by mouth. This medicine may be used for other purposes; ask your health care provider or pharmacist if you have questions. COMMON BRAND NAME(S): Injectafer What should I tell my health care provider before I take this medicine? They need to know if you have any of these conditions: -anemia not caused by low iron levels -high levels of iron in the blood -liver disease -an unusual or allergic reaction to iron, other medicines, foods, dyes, or preservatives -pregnant or trying to get pregnant -breast-feeding How should I use this medicine? This medicine is for infusion into a vein. It is given by a health care professional in a hospital or clinic setting. Talk to your pediatrician regarding the use of this medicine in children. Special care may be needed. Overdosage: If you think you have taken too much of this medicine contact a poison control center or emergency room at once. NOTE: This medicine is only for you. Do not share this medicine with others. What if I miss a dose? It is important not to miss your dose. Call your doctor or health care professional if you are unable to keep an appointment. What may interact with this medicine? Do not take this medicine with any of the following medications: -deferoxamine -dimercaprol -other iron products This medicine may also interact with the following medications: -chloramphenicol -deferasirox This list may not describe all possible interactions. Give your health care provider a list of all the medicines, herbs, non-prescription drugs, or dietary supplements you use. Also tell them if you smoke, drink alcohol, or use  illegal drugs. Some items may interact with your medicine. What should I watch for while using this medicine? Visit your doctor or health care professional regularly. Tell your doctor if your symptoms do not start to get better or if they get worse. You may need blood work done while you are taking this medicine. You may need to follow a special diet. Talk to your doctor. Foods that contain iron include: whole grains/cereals, dried fruits, beans, or peas, leafy green vegetables, and organ meats (liver, kidney). What side effects may I notice from receiving this medicine? Side effects that you should report to your doctor or health care professional as soon as possible: -allergic reactions like skin rash, itching or hives, swelling of the face, lips, or tongue -breathing problems -changes in blood pressure -feeling faint or lightheaded, falls -flushing, sweating, or hot feelings Side effects that usually do not require medical attention (report to your doctor or health care professional if they continue or are bothersome): -changes in taste -constipation -dizziness -headache -nausea -pain, redness, or irritation at site where injected -vomiting This list may not describe all possible side effects. Call your doctor for medical advice about side effects. You may report side effects to FDA at 1-800-FDA-1088. Where should I keep my medicine? This drug is given in a hospital or clinic and will not be stored at home. NOTE: This sheet is a summary. It may not cover all possible information. If you have questions about this medicine, talk to your doctor, pharmacist, or health care provider.  2018 Elsevier/Gold Standard (2015-09-09 11:20:47)  

## 2016-12-26 ENCOUNTER — Ambulatory Visit (HOSPITAL_BASED_OUTPATIENT_CLINIC_OR_DEPARTMENT_OTHER): Payer: Managed Care, Other (non HMO)

## 2016-12-26 VITALS — BP 146/83 | HR 85 | Temp 98.0°F | Resp 18

## 2016-12-26 DIAGNOSIS — D509 Iron deficiency anemia, unspecified: Secondary | ICD-10-CM | POA: Diagnosis not present

## 2016-12-26 DIAGNOSIS — D508 Other iron deficiency anemias: Secondary | ICD-10-CM

## 2016-12-26 MED ORDER — SODIUM CHLORIDE 0.9 % IV SOLN
750.0000 mg | Freq: Once | INTRAVENOUS | Status: AC
  Administered 2016-12-26: 750 mg via INTRAVENOUS
  Filled 2016-12-26: qty 15

## 2016-12-26 NOTE — Patient Instructions (Signed)

## 2017-02-07 ENCOUNTER — Ambulatory Visit: Payer: Managed Care, Other (non HMO) | Admitting: Hematology

## 2017-02-07 ENCOUNTER — Other Ambulatory Visit: Payer: Managed Care, Other (non HMO)

## 2017-04-02 ENCOUNTER — Other Ambulatory Visit: Payer: Self-pay | Admitting: *Deleted

## 2017-04-02 NOTE — Patient Outreach (Signed)
[facility]) Care Management  04/02/2017  Sarah Saunders 11-15-1975 [id number]  Subjective: Telephone call to patient's home number, no answer, left HIPAA compliant voicemail message, and requested call back.   Objective: Per Sunny Schlein and chart review, patient hospitalized  03/26/17 - 03/30/17 for Morbid (severe) obesity.   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  Patient also has a history of iron deficiency anemia and diabetes.     Assessment: Received [employer] Transition of care referral on 04/02/17.  Transition of care follow up pending patient contact.     Plan: RNCM will call patient for 2nd telephone outreach attempt, transition of care follow up, within 10 business days if no return call.    Celina Shiley H. Annia Friendly, BSN, [place] [facility] Telephonic CM Phone: [phone] Fax: [phone]

## 2017-04-04 ENCOUNTER — Other Ambulatory Visit: Payer: Self-pay | Admitting: *Deleted

## 2017-04-04 NOTE — Patient Outreach (Signed)
[facility]) Care Management  04/04/2017  CAROLIN QUANG [birth year] [id number]   Subjective: Telephone call to patient's home number, no answer, left HIPAA compliant voicemail message, and requested call back.   Objective: Per Sunny Schlein and chart review, patient hospitalized  03/26/17 - 03/30/17 for Morbid (severe) obesity.   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  Patient also has a history of iron deficiency anemia and diabetes.     Assessment: Received [employer] Transition of care referral on 04/02/17.  Transition of care follow up pending patient contact.     Plan: RNCM will call patient for 3rd telephone outreach attempt, transition of care follow up, within 10 business days if no return call.    [place] Mallozzi H. Annia Friendly, BSN, [place] [facility] Telephonic CM Phone: [phone] Fax: [phone]

## 2017-04-06 ENCOUNTER — Encounter: Payer: Self-pay | Admitting: *Deleted

## 2017-04-06 ENCOUNTER — Other Ambulatory Visit: Payer: Self-pay | Admitting: *Deleted

## 2017-04-06 NOTE — Patient Outreach (Signed)
Sarah Saunders) Saunders Saunders  04/06/2017  Sarah Saunders 08-24-1975 [id number]  Subjective: Telephone call to patient's home  number, spoke with patient, and HIPAA verified.  State her new address is [address] [place], [place], [place] [address].  RNCM advised will send request to have it updated in the chart.  Discussed Sarah Saunders Saunders Saunders [employer] Transition of Saunders follow up, patient voiced understanding, and is in agreement to follow up.   Patient states she is doing well, tolerating liquid diet, and has follow up appointment with surgeon on 04/18/17.   States she is accessing her Sarah Saunders benefits as needed and has family medical leave act [employer]).  Patient states she does not have any education material, transition of Saunders, Saunders coordination, disease Saunders, disease monitoring, transportation, community resource, or pharmacy needs at this time. States she is very appreciative of the follow up and is in agreement to receive [place] Saunders information.   Objective: Per Sarah Saunders and chart review, patient hospitalized 03/26/17 - 03/30/17 for Morbid (severe) obesity. 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 Patient also has a history of iron deficiency anemia and diabetes.    Assessment:Received [employer] Transition of Saunders referral on 04/02/17. Transition of Saunders follow up completed, no Saunders Saunders needs, and will proceed with case closure.     Plan: RNCM will send patient successful outreach letter, Sarah Saunders pamphlet, and magnet. RNCM will send case closure due to follow up completed / no Saunders Saunders needs request to Sarah Saunders at [facility]. RNCM will send chart address update requestto Sarah Saunders at Sarah Saunders.    Sarah Saunders, BSN, [facility] Telephonic CM Phone: [phone] Fax: [phone]

## 2018-02-23 ENCOUNTER — Emergency Department (HOSPITAL_COMMUNITY)
Admission: EM | Admit: 2018-02-23 | Discharge: 2018-02-23 | Disposition: A | Discharge status: Home / Self Care | Payer: Managed Care, Other (non HMO) | Attending: Emergency Medicine | Admitting: Emergency Medicine

## 2018-02-23 ENCOUNTER — Other Ambulatory Visit: Payer: Self-pay

## 2018-02-23 ENCOUNTER — Encounter (HOSPITAL_COMMUNITY): Payer: Self-pay

## 2018-02-23 DIAGNOSIS — M436 Torticollis: Secondary | ICD-10-CM | POA: Insufficient documentation

## 2018-02-23 MED ORDER — DIAZEPAM 5 MG PO TABS: 5.0000 mg | ORAL_TABLET | Freq: Three times a day (TID) | ORAL | 0 refills | Status: DC | PRN

## 2018-02-23 MED ORDER — DIAZEPAM 5 MG/ML IJ SOLN
5.0000 mg | Freq: Once | INTRAMUSCULAR | Status: AC
  Administered 2018-02-23: 5 mg via INTRAVENOUS
  Filled 2018-02-23: qty 2

## 2018-02-23 MED ORDER — SODIUM CHLORIDE 0.9 % IV BOLUS
500.0000 mL | Freq: Once | INTRAVENOUS | Status: AC
  Administered 2018-02-23: 500 mL via INTRAVENOUS

## 2018-02-23 MED ORDER — KETOROLAC TROMETHAMINE 30 MG/ML IJ SOLN
30.0000 mg | Freq: Once | INTRAMUSCULAR | Status: AC
  Administered 2018-02-23: 30 mg via INTRAVENOUS
  Filled 2018-02-23: qty 1

## 2018-02-23 NOTE — ED Provider Notes (Signed)
Emergency Department Provider Note   I have reviewed the triage vital signs and the nursing notes.   HISTORY  Chief Complaint Torticollis   HPI Sarah Saunders is a 42 y.o. female with PMH of PCOS, gastric bypass, and pituitary tumor presents to the emergency department for evaluation of worsening right-sided neck pain.  Symptoms began yesterday after waking up.  The patient had mild right-sided neck discomfort and thought she may have slept wrong.  Her neck pain worsened throughout the day and she found it difficult to turn her neck with moving her entire body.  She denies any injury to the neck.  This morning she is developed posterior neck pain with swallowing and radiation of pain into the right side of her head.  She denies any vision changes.  No fevers or chills.  No vertigo sensation.  She is tried some over-the-counter medications and a muscle relaxer with no relief in symptoms. Denies sore throat. Also experiencing some right ear discomfort with swallowing.   Past Medical History:  Diagnosis Date  . Morbid obesity ([place]) 10/09/2016  . Pituitary tumor   . Polycystic ovary disease   . Prolactinoma [facility])     Patient Active Problem List   Diagnosis Date Noted  . Iron deficiency anemia 12/13/2016  . Morbid obesity ([place]) 10/09/2016  . Tachycardia 01/08/2012  . 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 POLYCYSTIC OVARIAN DISEASE 02/27/2008  . Overweight 02/27/2008  . ALLERGIC RHINITIS 02/27/2008    Past Surgical History:  Procedure Laterality Date  . TUBAL LIGATION     right fallopian tube removed  . tube removed      Allergies Butorphanol tartrate  Family History  Problem Relation Age of Onset  . Hypertension Father   . Hypertension Mother     Social History Social History   Tobacco Use  . Smoking status: Never Smoker  . Smokeless tobacco: Never Used  Substance Use Topics  . Alcohol use: No  . Drug use: No    Review of Systems  Constitutional: No  fever/chills Eyes: No visual changes. ENT: No sore throat. Cardiovascular: Denies chest pain. Respiratory: Denies shortness of breath. Gastrointestinal: No abdominal pain.  No nausea, no vomiting.  No diarrhea.  No constipation. Genitourinary: Negative for dysuria. Musculoskeletal: Negative for back pain. Positive right sided neck pain.  Skin: Negative for rash. Neurological: Negative for headaches, focal weakness or numbness.  10-point ROS otherwise negative.  ____________________________________________   PHYSICAL EXAM:  VITAL SIGNS: ED Triage Vitals  Enc Vitals Group     BP 02/23/18 0743 (!) 149/99     Pulse Rate 02/23/18 0743 81     Resp 02/23/18 0743 20     Temp 02/23/18 0743 98.8 F (37.1 C)     Temp Source 02/23/18 0743 Oral     SpO2 02/23/18 0743 99 %     Pain Score 02/23/18 0757 9   Constitutional: Alert and oriented. Well appearing and in no acute distress. Eyes: Conjunctivae are normal.  Head: Atraumatic. Ears:  Healthy appearing ear canals and TMs bilaterally Nose: No congestion/rhinnorhea. Mouth/Throat: Mucous membranes are moist.  Oropharynx non-erythematous. No PTA. Managing oral secretions well.  Neck: No stridor. Tenderness over the right lateral/posterior neck along the trapezius. No masses or cellulitis. No midline spine tenderness. ROM of the neck is limited 2/2 pain.  Cardiovascular: Good peripheral circulation.  Respiratory: Normal respiratory effort.  Gastrointestinal: No distention.  Musculoskeletal: No lower extremity tenderness nor edema. No gross deformities of extremities. Normal grip  strength and sensation in the bilateral upper extremities.  Neurologic:  Normal speech and language. No gross focal neurologic deficits are appreciated.  Skin:  Skin is warm, dry and intact. No rash noted.  ____________________________________________  RADIOLOGY  None ____________________________________________   PROCEDURES  Procedure(s) performed:    Procedures  None ____________________________________________   INITIAL IMPRESSION / ASSESSMENT AND PLAN / ED COURSE  Pertinent labs & imaging results that were available during my care of the patient were reviewed by me and considered in my medical decision making (see chart for details).  Patient presents to the emergency department with severe right-sided neck pain worsening over the past 24 hours.  No history of injury.  No concern for infection.  Posterior pharynx is normal w/o PTA or other lesion. No trismus.  No symptoms to suggest cord compression other central etiology.  Patient's symptoms are most consistent with a severe torticollis.  Plan for IV fluids, Toradol, Valium and reassess.  I do not see an indication for neck imaging at this time given tenderness over the muscle surface and no midline bony tenderness. No neuro symptoms or exam findings to suspect vertebral or carotid dissection.   Patient feeling better after IVF, Toradol, and Valium. ROM increased. Plan for continued supportive care at home and PCP follow up.   At this time, I do not feel there is any life-threatening condition present. I have reviewed and discussed all results (EKG, imaging, lab, urine as appropriate), exam findings with patient. I have reviewed nursing notes and appropriate previous records.  I feel the patient is safe to be discharged home without further emergent workup. Discussed usual and customary return precautions. Patient and family (if present) verbalize understanding and are comfortable with this plan.  Patient will follow-up with their primary care provider. If they do not have a primary care provider, information for follow-up has been provided to them. All questions have been answered.  ____________________________________________  FINAL CLINICAL IMPRESSION(S) / ED DIAGNOSES  Final diagnoses:  Torticollis, acute     MEDICATIONS GIVEN DURING THIS VISIT:  Medications  sodium  chloride 0.9 % bolus 500 mL (500 mLs Intravenous New Bag/Given 02/23/18 0909)  diazepam (VALIUM) injection 5 mg (5 mg Intravenous Given 02/23/18 0908)  ketorolac (TORADOL) 30 MG/ML injection 30 mg (30 mg Intravenous Given 02/23/18 0909)     NEW OUTPATIENT MEDICATIONS STARTED DURING THIS VISIT:  New Prescriptions   DIAZEPAM (VALIUM) 5 MG TABLET    Take 1 tablet (5 mg total) by mouth every 8 (eight) hours as needed for muscle spasms.    Note:  This document was prepared using Dragon voice recognition software and may include unintentional dictation errors.  Nanda Quinton, MD Emergency Medicine    Sarah Saunders, Sarah Olds, MD 02/23/18 [phone]

## 2018-02-23 NOTE — ED Triage Notes (Signed)
She c/o significant right-sided non-traumatic neck pain since yesterday, which radiates into right side of head/face. She denies fever, nor any other sign of illness and is in no distress, although she exhibits guarding behavior.

## 2018-02-23 NOTE — Discharge Instructions (Signed)
You were seen in the ED with neck pain. This appears to be caused by a muscle spasm. This can cause severe pain and last for several days. Apply heat and take the Valium for muscle relaxation only as needed. Do not take other muscle relaxer medications with Valium. Do not take other strong pain medication with Valium. Given your gastric bypass surgery you should not take Motrin or other NSAIDs regularly. Do not drive while taking this medication. Call your PCP to schedule a follow up appointment.

## 2018-09-13 ENCOUNTER — Encounter (HOSPITAL_COMMUNITY): Payer: Self-pay | Admitting: Emergency Medicine

## 2018-09-13 ENCOUNTER — Other Ambulatory Visit: Payer: Self-pay

## 2018-09-13 ENCOUNTER — Emergency Department (HOSPITAL_COMMUNITY)
Admission: EM | Admit: 2018-09-13 | Discharge: 2018-09-14 | Disposition: A | Discharge status: Home / Self Care | Payer: No Typology Code available for payment source | Attending: Emergency Medicine | Admitting: Emergency Medicine

## 2018-09-13 DIAGNOSIS — M542 Cervicalgia: Secondary | ICD-10-CM | POA: Insufficient documentation

## 2018-09-13 DIAGNOSIS — M546 Pain in thoracic spine: Secondary | ICD-10-CM | POA: Insufficient documentation

## 2018-09-13 DIAGNOSIS — Z79899 Other long term (current) drug therapy: Secondary | ICD-10-CM | POA: Insufficient documentation

## 2018-09-13 DIAGNOSIS — Z7984 Long term (current) use of oral hypoglycemic drugs: Secondary | ICD-10-CM | POA: Insufficient documentation

## 2018-09-13 DIAGNOSIS — M7918 Myalgia, other site: Secondary | ICD-10-CM

## 2018-09-13 DIAGNOSIS — R0789 Other chest pain: Secondary | ICD-10-CM | POA: Insufficient documentation
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
## 2018-09-14 ENCOUNTER — Emergency Department (HOSPITAL_COMMUNITY): Payer: No Typology Code available for payment source

## 2018-09-14 MED ORDER — METHOCARBAMOL 500 MG PO TABS
500.0000 mg | ORAL_TABLET | Freq: Once | ORAL | Status: AC
  Administered 2018-09-14: 500 mg via ORAL
  Filled 2018-09-14: qty 1

## 2018-09-14 MED ORDER — IBUPROFEN 600 MG PO TABS: 600.0000 mg | ORAL_TABLET | Freq: Four times a day (QID) | ORAL | 0 refills | Status: DC | PRN

## 2018-09-14 MED ORDER — METHOCARBAMOL 500 MG PO TABS: 500.0000 mg | ORAL_TABLET | Freq: Two times a day (BID) | ORAL | 0 refills | Status: DC

## 2018-09-14 MED ORDER — OXYCODONE-ACETAMINOPHEN 5-325 MG PO TABS
1.0000 | ORAL_TABLET | Freq: Once | ORAL | Status: AC
  Administered 2018-09-14: 1 via ORAL
  Filled 2018-09-14: qty 1

## 2018-09-14 NOTE — Discharge Instructions (Addendum)
Follow up with your doctor as needed if pain is no better in 3-4 days. Take medications as prescribed. Return here as needed.
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
## 2018-09-14 NOTE — ED Notes (Signed)
ED Provider at bedside. 

## 2018-09-14 NOTE — ED Notes (Signed)
Reviewed d/c instructions with pt, who verbalized understanding and had no outstanding questions. Pt departed in NAD, refused use of wheelchair.   

## 2018-09-14 NOTE — ED Notes (Addendum)
Patient transported to X-ray 

## 2019-03-20 ENCOUNTER — Encounter: Payer: Self-pay | Admitting: Cardiology

## 2019-03-20 ENCOUNTER — Other Ambulatory Visit: Payer: Self-pay

## 2019-03-20 ENCOUNTER — Ambulatory Visit (INDEPENDENT_AMBULATORY_CARE_PROVIDER_SITE_OTHER): Payer: 59 | Admitting: Cardiology

## 2019-03-20 VITALS — BP 110/67 | HR 85 | Ht 67.5 in | Wt 183.4 lb

## 2019-03-20 DIAGNOSIS — R000 Tachycardia, unspecified: Secondary | ICD-10-CM | POA: Diagnosis not present

## 2019-03-20 NOTE — Progress Notes (Signed)
Subjective:  Primary Physician:  Merrilee Seashore, MD  Patient ID: Sarah Saunders, female    DOB: 1976-04-18, 43 y.o.   MRN: [id number]  Chief Complaint  Patient presents with  . Tachycardia  . New Patient (Initial Visit)   HPI: Sarah Saunders  is a 43 y.o. female. She has history of rare spells of sudden fast heartbeat in the past. Patient says that the first spell occurred about 15 years ago and her heart rate was 225/m at that time.  However, by the time EMS arrived her heart rate had come down to normal.  Since then she had rare spells, approximately once in 4 years.  However, recently she has noticed symptoms more frequently.  About 10 days ago she had sudden rapid heart beat with heart rate 204/m and her oxygen saturation dropped to 77% at that time. She had near fainting sensation. There is no history of frank syncope. Her symptoms usually subside in about a minute. During the spells, patient says I feel like "I'm on the edge". No other associated symptoms.  Patient denies any complaints of chest pain, shortness of breath, orthopnea or PND.  No history of swelling on the legs.  No past history of DVT.  No history of diabetes, hypertension or hypercholesterolemia.  She does not smoke or drink alcohol. She usually drinks one cup of coffee a day. No other caffeine containing beverages and no history of taking decongestants.  She has history of polycystic ovarian syndrome, GERD, anemia.  Patient had bariatric surgery in the past.  She also has h/o small pituitary tumor with hyperprolactinemia.  Past Medical History:  Diagnosis Date  . Anxiety   . Morbid obesity ([place]) 10/09/2016  . Pituitary tumor   . Polycystic ovary disease   . 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)    . TUBAL LIGATION     right fallopian tube removed  . tube removed      Social History    Socioeconomic History  . Marital status: Married    Spouse name: Not on file  . Number of children: 1  . Years of education: Not on file  . Highest education level: Not on file  Occupational History  . Occupation: LPN    Employer: [employer]  Social Needs  . Financial resource strain: Not on file  . Food insecurity    Worry: Not on file    Inability: Not on file  . Transportation needs    Medical: Not on file    Non-medical: Not on file  Tobacco Use  . Smoking status: Never Smoker  . Smokeless tobacco: Never Used  Substance and Sexual Activity  . Alcohol use: No  . Drug use: No  . Sexual activity: Not on file  Lifestyle  . Physical activity    Days per week: Not on file    Minutes per session: Not on file  . Stress: Not on file  Relationships  . Social Herbalist on phone: Not on file    Gets together: Not on file    Attends religious service: Not on file    Active member of club or organization: Not on file    Attends meetings of clubs or organizations: Not on file    Relationship status: Not on file  . Intimate partner violence    Fear of current or ex partner:  Not on file    Emotionally abused: Not on file    Physically abused: Not on file    Forced sexual activity: Not on file  Other Topics Concern  . Not on file  Social History Narrative   Lives with husband and eighteen year.      Current Outpatient Medications on File Prior to Visit  Medication Sig Dispense Refill  . albuterol (PROVENTIL HFA;VENTOLIN HFA) 108 (90 BASE) MCG/ACT inhaler Inhale 2 puffs into the lungs every 4 (four) hours as needed for wheezing or shortness of breath.    . bromocriptine (PARLODEL) 2.5 MG tablet 2 tablets daily.    . Calcium Citrate-Vitamin D (CALCIUM CITRATE +D PO) 600 capsules 4 (four) times a week.    . diazepam (VALIUM) 5 MG tablet Take 1 tablet (5 mg total) by mouth every 8 (eight) hours as needed for muscle spasms. 10 tablet 0  . folic acid (FOLVITE) 409 MCG  tablet Take 800 mcg by mouth 2 (two) times daily.    . Glucosamine Sulfate 1000 MG CAPS 2 (two) times a week.    . Inositol-Folic Acid (PREGNITUDE PO) Take by mouth 2 (two) times a day.    . iron polysaccharides (NIFEREX) 150 MG capsule Take 150 mg by mouth 2 (two) times daily.    . Magnesium 100 MG CAPS 800 mg 2 (two) times a day.    . Multiple Vitamin (MULTIVITAMIN) tablet Take 1 tablet by mouth 2 (two) times a day.    . RABEprazole (ACIPHEX) 20 MG tablet daily.    . sertraline (ZOLOFT) 100 MG tablet Take 150 mg by mouth daily.     . TURMERIC PO Take 800 mg by mouth 2 (two) times a day.    . Vitamin A 7.5 MG (25000 UT) CAPS Take 25,000 Units by mouth daily.     Marland Kitchen VITAMIN D PO Take 50,000 Units by mouth every morning.      No current facility-administered medications on file prior to visit.     Review of Systems  Constitutional: Negative for fever.  HENT: Negative for nosebleeds.   Eyes: Negative for blurred vision.  Respiratory: Negative for cough and shortness of breath.   Cardiovascular: Positive for palpitations (sudden spells of fast heart beat). Negative for chest pain, orthopnea and leg swelling.  Gastrointestinal: Negative for abdominal pain, nausea and vomiting.  Genitourinary: Negative for dysuria.  Musculoskeletal: Negative for myalgias.  Skin: Negative for itching and rash.  Neurological: Negative for dizziness, seizures and loss of consciousness.  Psychiatric/Behavioral: The patient is not nervous/anxious.       Objective:  Blood pressure 110/67, pulse 85, height 5' 7.5" (1.715 m), weight 183 lb 6.4 oz (83.2 kg), SpO2 100 %. Body mass index is 28.3 kg/m.  Physical Exam  Constitutional: She is oriented to person, place, and time. She appears well-developed and well-nourished.  HENT:  Head: Normocephalic and atraumatic.  Eyes: Conjunctivae are normal.  Neck: No thyromegaly present.  Cardiovascular: Normal rate, regular rhythm and normal heart sounds. Exam reveals no  gallop.  No murmur heard. Pulses:      Carotid pulses are 2+ on the right side and 2+ on the left side.      Femoral pulses are 2+ on the right side and 2+ on the left side.      Dorsalis pedis pulses are 2+ on the right side and 2+ on the left side.       Posterior tibial pulses are 2+ on the right  side and 2+ on the left side.  Pulmonary/Chest: She has no wheezes. She has no rales.  Abdominal: She exhibits no mass. There is no abdominal tenderness.  Musculoskeletal:        General: No edema.  Lymphadenopathy:    She has no cervical adenopathy.  Neurological: She is alert and oriented to person, place, and time.  Skin: Skin is warm.    CARDIAC STUDIES:  Echocardiogram at [facility] [place] office-02/13/2019- normal left ventricular size and thickness, normal EF-50-55 percent.  Normal diastolic function.  Borderline enlarged left and right atrium, mildly thickened mitral valve.  Mild MR and TR.  No evidence of pulmonary hypertension.  Assessment & Recommendations:   1. Tachycardia - EKG 12-Lead- Sinus  Rhythm  WITHIN NORMAL LIMITS  Laboratory Exam:  CBC Latest Ref Rng & Units 12/13/2016 12/13/2016 11/07/2015  WBC 3.9 - 10.3 10e3/uL 7.2 - 7.1  Hemoglobin 11.6 - 15.9 g/dL 10.3(L) - 11.0(L)  Hematocrit 34.0 - 46.6 % 33.0(L) 32.7(L) 34.2(L)  Platelets 145 - 400 10e3/uL 259 - 283   CMP Latest Ref Rng & Units 12/13/2016 11/07/2015 12/15/2014  Glucose 70 - 140 mg/dl 84 112(H) 87  BUN 7.0 - 26.0 mg/dL 12.1 11 12   Creatinine 0.6 - 1.1 mg/dL 0.7 0.70 0.65  Sodium 136 - 145 mEq/L 140 137 138  Potassium 3.5 - 5.1 mEq/L 4.0 4.2 3.6  Chloride 101 - 111 mmol/L - 107 108  CO2 22 - 29 mEq/L 24 20(L) 25  Calcium 8.4 - 10.4 mg/dL 9.6 9.1 8.7(L)  Total Protein 6.4 - 8.3 g/dL 7.1 - 6.7  Total Bilirubin 0.20 - 1.20 mg/dL 0.22 - 0.4  Alkaline Phos 40 - 150 U/L 99 - 73  AST 5 - 34 U/L 16 - 26  ALT 0 - 55 U/L 13 - 18   Lipid Panel     Component Value Date/Time   CHOL 136 02/27/2008  1027   TRIG 55 02/27/2008 1027   HDL 40.0 02/27/2008 1027   CHOLHDL 3.4 CALC 02/27/2008 1027   VLDL 11 02/27/2008 1027   LDLCALC 85 02/27/2008 1027   Recommendation:  I have discussed my assessment with the patient.  She has symptoms of sudden spells of very fast heartbeat. Exact nature of arrhythmia is unclear. It appears to be probably PSVT or paroxysmal atrial flutter/fibrillation. We will do cardiac event monitoring and she has also been scheduled for treadmill stress test.  Patient was explained and demonstrated to try Valsalva maneuver when she gets sudden fast heartbeat.  Primary prevention was discussed with her. She was advised to follow low cholesterol diet and exercise regularly. She was also advised to avoid caffeine, decongestants or other stimulants.  I will see her in follow-up after the above tests.  Despina Hick, MD, [facility] 03/20/2019, 4:36 PM [facility] [place] Pager: [phone] Office: [phone] If no answer Cell [phone]

## 2019-03-31 ENCOUNTER — Ambulatory Visit (INDEPENDENT_AMBULATORY_CARE_PROVIDER_SITE_OTHER): Payer: 59

## 2019-03-31 ENCOUNTER — Other Ambulatory Visit: Payer: Self-pay

## 2019-03-31 DIAGNOSIS — R000 Tachycardia, unspecified: Secondary | ICD-10-CM

## 2019-04-07 ENCOUNTER — Other Ambulatory Visit: Payer: Self-pay | Admitting: Obstetrics and Gynecology

## 2019-05-26 ENCOUNTER — Other Ambulatory Visit: Payer: Self-pay

## 2019-05-26 ENCOUNTER — Ambulatory Visit: Payer: 59 | Admitting: Cardiology

## 2019-05-26 ENCOUNTER — Encounter: Payer: Self-pay | Admitting: Cardiology

## 2019-05-26 ENCOUNTER — Ambulatory Visit (INDEPENDENT_AMBULATORY_CARE_PROVIDER_SITE_OTHER): Payer: 59 | Admitting: Cardiology

## 2019-05-26 VITALS — BP 113/73 | HR 89 | Ht 67.0 in | Wt 173.0 lb

## 2019-05-26 DIAGNOSIS — R002 Palpitations: Secondary | ICD-10-CM | POA: Diagnosis not present

## 2019-05-26 DIAGNOSIS — D509 Iron deficiency anemia, unspecified: Secondary | ICD-10-CM

## 2019-05-26 DIAGNOSIS — I471 Supraventricular tachycardia: Secondary | ICD-10-CM | POA: Insufficient documentation

## 2019-05-26 DIAGNOSIS — I479 Paroxysmal tachycardia, unspecified: Secondary | ICD-10-CM | POA: Insufficient documentation

## 2019-05-26 NOTE — Progress Notes (Signed)
Primary Physician:  Merrilee Seashore, MD   Patient ID: Sarah Saunders, female    DOB: 03-17-1976, 43 y.o.   MRN: [id number]  Subjective:    Chief Complaint  Patient presents with  . Tachycardia  . Follow-up    HPI: Sarah Saunders  is a 43 y.o. female  with history of polycystic ovarian syndrome, GERD, anemia.  Patient had bariatric surgery in the past.  She also has h/o small pituitary tumor with hyperprolactinemia.  At her last office visit, she had reported history of rare spells of sudden fast heartbeat in the past. Patient says that the first spell occurred about 15 years ago and her heart rate was 225/m at that time.  However, by the time EMS arrived her heart rate had come down to normal.  Since then she had rare spells, approximately once in 4 years.  However, recently she has noticed symptoms more frequently.  About 10 days ago she had sudden rapid heart beat with heart rate 204/m and her oxygen saturation dropped to 77% at that time. She had near fainting sensation. There is no history of frank syncope. Her symptoms usually subside in about a minute. During the spells, patient says I feel like "I'm on the edge". No other associated symptoms. She underwent GXT and event monitor and now presents for follow up.  She has not had recurrent symptoms. Patient denies any complaints of chest pain, shortness of breath, orthopnea or PND.  No history of swelling on the legs.  No past history of DVT.  No history of diabetes, hypertension or hypercholesterolemia.  She does not smoke or drink alcohol. She usually drinks one cup of coffee a day. No other caffeine containing beverages and no history of taking decongestants.  Past Medical History:  Diagnosis Date  . Anxiety   . Morbid obesity ([place]) 10/09/2016  . Pituitary tumor   . Polycystic ovary disease   . 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 LAPAROSCOPIC GASTRIC RESTRICTIVE DUODENAL  PROCEDURE (DUODENAL SWITCH)    . TUBAL LIGATION     right fallopian tube removed  . tube removed      Social History   Socioeconomic History  . Marital status: Married    Spouse name: Not on file  . Number of children: 1  . Years of education: Not on file  . Highest education level: Not on file  Occupational History  . Occupation: LPN    Employer: [employer]  Social Needs  . Financial resource strain: Not on file  . Food insecurity    Worry: Not on file    Inability: Not on file  . Transportation needs    Medical: Not on file    Non-medical: Not on file  Tobacco Use  . Smoking status: Never Smoker  . Smokeless tobacco: Never Used  Substance and Sexual Activity  . Alcohol use: No  . Drug use: No  . Sexual activity: Not on file  Lifestyle  . Physical activity    Days per week: Not on file    Minutes per session: Not on file  . Stress: Not on file  Relationships  . Social Herbalist on phone: Not on file    Gets together: Not on file    Attends religious service: Not on file    Active member of club or organization: Not on file    Attends meetings of clubs or organizations: Not  on file    Relationship status: Not on file  . Intimate partner violence    Fear of current or ex partner: Not on file    Emotionally abused: Not on file    Physically abused: Not on file    Forced sexual activity: Not on file  Other Topics Concern  . Not on file  Social History Narrative   Lives with husband and eighteen year.      Review of Systems  Constitution: Negative for decreased appetite, malaise/fatigue, weight gain and weight loss.  Eyes: Negative for visual disturbance.  Cardiovascular: Positive for palpitations (improved, couple of times a year). Negative for chest pain, claudication, dyspnea on exertion, leg swelling, orthopnea and syncope.  Respiratory: Negative for hemoptysis and wheezing.   Endocrine: Negative for cold intolerance and heat intolerance.   Hematologic/Lymphatic: Does not bruise/bleed easily.  Skin: Negative for nail changes.  Musculoskeletal: Negative for muscle weakness and myalgias.  Gastrointestinal: Negative for abdominal pain, change in bowel habit, nausea and vomiting.  Neurological: Negative for difficulty with concentration, dizziness, focal weakness and headaches.  Psychiatric/Behavioral: Negative for altered mental status and suicidal ideas.  All other systems reviewed and are negative.     Objective:  Blood pressure 113/73, pulse 89, height 5\' 7"  (1.702 m), weight 173 lb (78.5 kg), SpO2 98 %. Body mass index is 27.1 kg/m.    Physical Exam  Constitutional: She is oriented to person, place, and time. She appears well-developed and well-nourished.  HENT:  Head: Normocephalic and atraumatic.  Eyes: Conjunctivae are normal.  Neck: No thyromegaly present.  Cardiovascular: Normal rate, regular rhythm and normal heart sounds. Exam reveals no gallop.  No murmur heard. Pulses:      Carotid pulses are 2+ on the right side and 2+ on the left side.      Femoral pulses are 2+ on the right side and 2+ on the left side.      Dorsalis pedis pulses are 2+ on the right side and 2+ on the left side.       Posterior tibial pulses are 2+ on the right side and 2+ on the left side.  Pulmonary/Chest: She has no wheezes. She has no rales.  Abdominal: She exhibits no mass. There is no abdominal tenderness.  Musculoskeletal:        General: No edema.  Lymphadenopathy:    She has no cervical adenopathy.  Neurological: She is alert and oriented to person, place, and time.  Skin: Skin is warm.   Radiology: No results found.  Laboratory examination:    CMP Latest Ref Rng & Units 12/13/2016 11/07/2015 12/15/2014  Glucose 70 - 140 mg/dl 84 112(H) 87  BUN 7.0 - 26.0 mg/dL 12.1 11 12   Creatinine 0.6 - 1.1 mg/dL 0.7 0.70 0.65  Sodium 136 - 145 mEq/L 140 137 138  Potassium 3.5 - 5.1 mEq/L 4.0 4.2 3.6  Chloride 101 - 111 mmol/L - 107  108  CO2 22 - 29 mEq/L 24 20(L) 25  Calcium 8.4 - 10.4 mg/dL 9.6 9.1 8.7(L)  Total Protein 6.4 - 8.3 g/dL 7.1 - 6.7  Total Bilirubin 0.20 - 1.20 mg/dL 0.22 - 0.4  Alkaline Phos 40 - 150 U/L 99 - 73  AST 5 - 34 U/L 16 - 26  ALT 0 - 55 U/L 13 - 18   CBC Latest Ref Rng & Units 12/13/2016 12/13/2016 11/07/2015  WBC 3.9 - 10.3 10e3/uL 7.2 - 7.1  Hemoglobin 11.6 - 15.9 g/dL 10.3(L) - 11.0(L)  Hematocrit 34.0 - 46.6 % 33.0(L) 32.7(L) 34.2(L)  Platelets 145 - 400 10e3/uL 259 - 283   Lipid Panel     Component Value Date/Time   CHOL 136 02/27/2008 1027   TRIG 55 02/27/2008 1027   HDL 40.0 02/27/2008 1027   CHOLHDL 3.4 CALC 02/27/2008 1027   VLDL 11 02/27/2008 1027   LDLCALC 85 02/27/2008 1027   HEMOGLOBIN A1C Lab Results  Component Value Date   HGBA1C 5.8 02/27/2008   TSH No results for input(s): TSH in the last 8760 hours.  PRN Meds:. There are no discontinued medications. Current Meds  Medication Sig  . albuterol (PROVENTIL HFA;VENTOLIN HFA) 108 (90 BASE) MCG/ACT inhaler Inhale 2 puffs into the lungs every 4 (four) hours as needed for wheezing or shortness of breath.  . bromocriptine (PARLODEL) 2.5 MG tablet 2 tablets daily.  . Calcium Citrate-Vitamin D (CALCIUM CITRATE +D PO) 600 capsules. 4 times a day  . diazepam (VALIUM) 5 MG tablet Take 1 tablet (5 mg total) by mouth every 8 (eight) hours as needed for muscle spasms.  . folic acid (FOLVITE) Q000111Q MCG tablet Take 800 mcg by mouth 2 (two) times daily.  . Glucosamine Sulfate 1000 MG CAPS 2 (two) times daily.   . Inositol-Folic Acid (PREGNITUDE PO) Take by mouth 2 (two) times a day.  . iron polysaccharides (NIFEREX) 150 MG capsule Take 150 mg by mouth 2 (two) times daily.  . Magnesium 100 MG CAPS 800 mg 2 (two) times a day.  . Multiple Vitamin (MULTIVITAMIN) tablet Take 1 tablet by mouth 2 (two) times a day.  . RABEprazole (ACIPHEX) 20 MG tablet daily.  . sertraline (ZOLOFT) 100 MG tablet Take 150 mg by mouth daily.   . TURMERIC  PO Take 800 mg by mouth 2 (two) times a day.  . Vitamin A 7.5 MG (25000 UT) CAPS Take 25,000 Units by mouth daily.   Marland Kitchen VITAMIN D PO Take 50,000 Units by mouth every morning.     Cardiac Studies:   Echocardiogram at [place] office-02/13/2019- normal left ventricular size and thickness, normal EF-50-55 percent.  Normal diastolic function.  Borderline enlarged left and right atrium, mildly thickened mitral valve.  Mild MR and TR.  No evidence of pulmonary hypertension.  Exercise treadmill stress test 03/31/2019:  Indication: Tachycardia  The patient exercised on Bruce protocol for  9:00 min. Patient achieved  10.16 METS and reached HR  171 bpm, which is   95% of maximum age-predicted HR.  Stress test terminated due to Fatigue.  Exercise capacity was normal. HR Response to Exercise: Appropriate. BP Response to Exercise: Normal resting BP- appropriate response. Chest Pain: none.Arrhythmias: none. Resting EKG demonstrates Normal sinus rhythm. ST Changes: With peak exercise there was no ST-T changes of ischemia.  Overall Impression: Normal stress test. Recommendations: Continue primary/secondary prevention.  30 day event monitor 07/30-08/28/20: 6 patient triggered events occurred with 1 episode of reported symptoms of shortness of breath, lightheaded on day 2 that correlated with NSR. No reported symptoms on other patient triggered events. Occasional sinus tachycardia with max heart rate of 158 bpm. No symptoms. No A fib or SVT was noted.    Assessment:   Palpitations  Iron deficiency anemia, unspecified iron deficiency anemia type    Recommendations:   I discussed recently obtained stress test and event monitor results with the patient, she did not have any episodes while wearing event monitor.  No exercise-induced arrhythmias.  Her episodes are fairly infrequent, but fairly suggestive of  SVT.  I have encouraged her to use vagal maneuvers should she have  recurrence of symptoms and if these do not help with her episodes, will consider medical therapy.  If she has increased frequency, will consider repeating monitoring for diagnosis.  She has a history of iron deficiency anemia, being managed by her PCP.  She will follow-up with her PCP regarding this.  Patient is without symptoms or complaints today.  I will see her back in 1 year or sooner if problems.  Miquel Dunn, MSN, APRN, FNP-C [facility]. [place] Office: [phone] Fax: [phone]

## 2019-06-30 ENCOUNTER — Telehealth: Payer: Self-pay

## 2019-06-30 MED ORDER — DILTIAZEM HCL 30 MG PO TABS: 30.0000 mg | ORAL_TABLET | Freq: Three times a day (TID) | ORAL | 3 refills | Status: DC | PRN

## 2019-06-30 NOTE — Telephone Encounter (Signed)
Did she try the vagal manuevers?

## 2019-06-30 NOTE — Telephone Encounter (Signed)
Pt wants to try medication.//ah

## 2019-06-30 NOTE — Telephone Encounter (Signed)
She did but couldn't tell if it worked or not.//ah

## 2019-06-30 NOTE — Telephone Encounter (Signed)
Pt called stating that she had an episode today where her heart rate jumped up to 230 an lasted about 2-36min then gradually came down to 90. Please review and advise.//ah

## 2019-06-30 NOTE — Telephone Encounter (Signed)
Her episodes are pretty infrequent. Will hold off on repeating a monitor for now as I don't know we will be able to catch anything. If has increased frequency of the episodes, we can try a monitor at that time. Options are we can continue with watchful waiting and vagal maneuvers for now vs. Send in a medication that she can use as needed

## 2019-06-30 NOTE — Telephone Encounter (Signed)
LMOM to return call//ah

## 2019-07-02 NOTE — Telephone Encounter (Signed)
LMOM advising.//ah

## 2019-09-29 DIAGNOSIS — E559 Vitamin D deficiency, unspecified: Secondary | ICD-10-CM | POA: Diagnosis not present

## 2019-09-29 DIAGNOSIS — E221 Hyperprolactinemia: Secondary | ICD-10-CM | POA: Diagnosis not present

## 2019-09-29 DIAGNOSIS — D443 Neoplasm of uncertain behavior of pituitary gland: Secondary | ICD-10-CM | POA: Diagnosis not present

## 2019-09-29 DIAGNOSIS — R7301 Impaired fasting glucose: Secondary | ICD-10-CM | POA: Diagnosis not present

## 2019-10-02 DIAGNOSIS — Z3141 Encounter for fertility testing: Secondary | ICD-10-CM | POA: Diagnosis not present

## 2019-10-03 DIAGNOSIS — Z3141 Encounter for fertility testing: Secondary | ICD-10-CM | POA: Diagnosis not present

## 2019-10-06 DIAGNOSIS — E282 Polycystic ovarian syndrome: Secondary | ICD-10-CM | POA: Diagnosis not present

## 2019-10-06 DIAGNOSIS — Z01812 Encounter for preprocedural laboratory examination: Secondary | ICD-10-CM | POA: Diagnosis not present

## 2019-10-06 DIAGNOSIS — Z20822 Contact with and (suspected) exposure to covid-19: Secondary | ICD-10-CM | POA: Diagnosis not present

## 2019-10-06 DIAGNOSIS — E221 Hyperprolactinemia: Secondary | ICD-10-CM | POA: Diagnosis not present

## 2019-10-06 DIAGNOSIS — D443 Neoplasm of uncertain behavior of pituitary gland: Secondary | ICD-10-CM | POA: Diagnosis not present

## 2019-10-06 DIAGNOSIS — I10 Essential (primary) hypertension: Secondary | ICD-10-CM | POA: Diagnosis not present

## 2019-10-08 DIAGNOSIS — Z3141 Encounter for fertility testing: Secondary | ICD-10-CM | POA: Diagnosis not present

## 2019-10-08 DIAGNOSIS — Z9884 Bariatric surgery status: Secondary | ICD-10-CM | POA: Diagnosis not present

## 2019-10-08 DIAGNOSIS — D443 Neoplasm of uncertain behavior of pituitary gland: Secondary | ICD-10-CM | POA: Diagnosis not present

## 2019-10-14 DIAGNOSIS — Z3141 Encounter for fertility testing: Secondary | ICD-10-CM | POA: Diagnosis not present

## 2019-10-17 DIAGNOSIS — Z3141 Encounter for fertility testing: Secondary | ICD-10-CM | POA: Diagnosis not present

## 2019-10-20 DIAGNOSIS — Z3141 Encounter for fertility testing: Secondary | ICD-10-CM | POA: Diagnosis not present

## 2019-10-22 DIAGNOSIS — Z3141 Encounter for fertility testing: Secondary | ICD-10-CM | POA: Diagnosis not present

## 2019-10-24 DIAGNOSIS — Z3141 Encounter for fertility testing: Secondary | ICD-10-CM | POA: Diagnosis not present

## 2020-01-23 ENCOUNTER — Emergency Department (HOSPITAL_COMMUNITY)
Admission: EM | Admit: 2020-01-23 | Discharge: 2020-01-23 | Disposition: A | Discharge status: Home / Self Care | Payer: BC Managed Care – PPO | Attending: Emergency Medicine | Admitting: Emergency Medicine

## 2020-01-23 ENCOUNTER — Other Ambulatory Visit: Payer: Self-pay

## 2020-01-23 ENCOUNTER — Encounter (HOSPITAL_COMMUNITY): Payer: Self-pay | Admitting: Emergency Medicine

## 2020-01-23 ENCOUNTER — Emergency Department (HOSPITAL_COMMUNITY): Payer: BC Managed Care – PPO

## 2020-01-23 DIAGNOSIS — R109 Unspecified abdominal pain: Secondary | ICD-10-CM | POA: Diagnosis not present

## 2020-01-23 DIAGNOSIS — N201 Calculus of ureter: Secondary | ICD-10-CM | POA: Diagnosis not present

## 2020-01-23 DIAGNOSIS — Z79899 Other long term (current) drug therapy: Secondary | ICD-10-CM | POA: Insufficient documentation

## 2020-01-23 DIAGNOSIS — N200 Calculus of kidney: Secondary | ICD-10-CM | POA: Insufficient documentation

## 2020-01-23 DIAGNOSIS — N211 Calculus in urethra: Secondary | ICD-10-CM | POA: Diagnosis not present

## 2020-01-23 DIAGNOSIS — R1031 Right lower quadrant pain: Secondary | ICD-10-CM | POA: Diagnosis not present

## 2020-01-23 LAB — URINALYSIS, ROUTINE W REFLEX MICROSCOPIC
Bacteria, UA: NONE SEEN
Bilirubin Urine: NEGATIVE
Glucose, UA: NEGATIVE mg/dL
Hgb urine dipstick: NEGATIVE
Ketones, ur: 20 mg/dL — AB
Nitrite: NEGATIVE
Protein, ur: NEGATIVE mg/dL
Specific Gravity, Urine: 1.025 (ref 1.005–1.030)
pH: 5 (ref 5.0–8.0)

## 2020-01-23 LAB — COMPREHENSIVE METABOLIC PANEL
ALT: 27 U/L (ref 0–44)
AST: 26 U/L (ref 15–41)
Albumin: 4.3 g/dL (ref 3.5–5.0)
Alkaline Phosphatase: 92 U/L (ref 38–126)
Anion gap: 12 (ref 5–15)
BUN: 21 mg/dL — ABNORMAL HIGH (ref 6–20)
CO2: 21 mmol/L — ABNORMAL LOW (ref 22–32)
Calcium: 9.6 mg/dL (ref 8.9–10.3)
Chloride: 106 mmol/L (ref 98–111)
Creatinine, Ser: 0.94 mg/dL (ref 0.44–1.00)
GFR calc Af Amer: >60 mL/min (ref >60)
GFR calc non Af Amer: >60 mL/min (ref >60)
Glucose, Bld: 128 mg/dL — ABNORMAL HIGH (ref 70–99)
Potassium: 3.8 mmol/L (ref 3.5–5.1)
Sodium: 139 mmol/L (ref 135–145)
Total Bilirubin: 0.6 mg/dL (ref 0.3–1.2)
Total Protein: 7.2 g/dL (ref 6.5–8.1)

## 2020-01-23 LAB — CBC WITH DIFFERENTIAL/PLATELET
Abs Immature Granulocytes: 0.02 10*3/uL (ref 0.00–0.07)
Basophils Absolute: 0 10*3/uL (ref 0.0–0.1)
Basophils Relative: 1 %
Eosinophils Absolute: 0 10*3/uL (ref 0.0–0.5)
Eosinophils Relative: 0 %
HCT: 35 % — ABNORMAL LOW (ref 36.0–46.0)
Hemoglobin: 11.1 g/dL — ABNORMAL LOW (ref 12.0–15.0)
Immature Granulocytes: 0 %
Lymphocytes Relative: 42 %
Lymphs Abs: 2.1 10*3/uL (ref 0.7–4.0)
MCH: 27.8 pg (ref 26.0–34.0)
MCHC: 31.7 g/dL (ref 30.0–36.0)
MCV: 87.5 fL (ref 80.0–100.0)
Monocytes Absolute: 0.3 10*3/uL (ref 0.1–1.0)
Monocytes Relative: 6 %
Neutro Abs: 2.5 10*3/uL (ref 1.7–7.7)
Neutrophils Relative %: 51 %
Platelets: 203 10*3/uL (ref 150–400)
RBC: 4 MIL/uL (ref 3.87–5.11)
RDW: 13.7 % (ref 11.5–15.5)
WBC: 4.9 10*3/uL (ref 4.0–10.5)
nRBC: 0 % (ref 0.0–0.2)

## 2020-01-23 LAB — I-STAT BETA HCG BLOOD, ED (MC, WL, AP ONLY): I-stat hCG, quantitative: 7.7 m[IU]/mL — ABNORMAL HIGH (ref <5)

## 2020-01-23 LAB — PREGNANCY, URINE: Preg Test, Ur: NEGATIVE

## 2020-01-23 MED ORDER — ONDANSETRON 4 MG PO TBDP: 4.0000 mg | ORAL_TABLET | Freq: Three times a day (TID) | ORAL | 0 refills | Status: DC | PRN

## 2020-01-23 MED ORDER — IOHEXOL 300 MG/ML  SOLN
100.0000 mL | Freq: Once | INTRAMUSCULAR | Status: AC | PRN
  Administered 2020-01-23: 100 mL via INTRAVENOUS

## 2020-01-23 MED ORDER — TAMSULOSIN HCL 0.4 MG PO CAPS: 0.4000 mg | ORAL_CAPSULE | Freq: Every day | ORAL | 0 refills | Status: AC

## 2020-01-23 MED ORDER — OXYCODONE-ACETAMINOPHEN 5-325 MG PO TABS
1.0000 | ORAL_TABLET | Freq: Once | ORAL | Status: AC
  Administered 2020-01-23: 1 via ORAL
  Filled 2020-01-23: qty 1

## 2020-01-23 MED ORDER — OXYCODONE-ACETAMINOPHEN 5-325 MG PO TABS: 1.0000 | ORAL_TABLET | Freq: Four times a day (QID) | ORAL | 0 refills | Status: DC | PRN

## 2020-01-23 MED ORDER — KETOROLAC TROMETHAMINE 30 MG/ML IJ SOLN
30.0000 mg | Freq: Once | INTRAMUSCULAR | Status: AC
  Administered 2020-01-23: 30 mg via INTRAVENOUS
  Filled 2020-01-23: qty 1

## 2020-01-23 MED ORDER — FENTANYL CITRATE (PF) 100 MCG/2ML IJ SOLN
50.0000 ug | Freq: Once | INTRAMUSCULAR | Status: AC
  Administered 2020-01-23: 50 ug via INTRAVENOUS
  Filled 2020-01-23: qty 2

## 2020-01-23 NOTE — Discharge Instructions (Signed)
Please call [facility] on Monday to schedule an appointment to be seen Pick up medications and take as prescribed. It is very important to drink plenty of water to stay hydrated.  Return to the ED for any worsening symptoms including worsening pain, fevers > 100.4, inability to urinate, excessive vomiting, or any other concerning symptoms

## 2020-01-23 NOTE — ED Provider Notes (Signed)
[facility] EMERGENCY DEPARTMENT Provider Note   CSN: [id number] Arrival date & time: 01/23/20  0031     History Chief Complaint  Patient presents with  . Flank Pain    Sarah Saunders is a 44 y.o. female.  Patient presents to the emergency department with a chief complaint of right flank pain and right lower abdominal pain.  She states pain started last night.  She denies any fevers chills.  She reports associated nausea with vomiting.  She denies any diarrhea, dysuria, or hematuria.  Denies any urinary symptoms.  She has taken Percocet in triage and has had some relief.  She states that the pain is worsened with movement and palpation.  The history is provided by the patient. No language interpreter was used.       Past Medical History:  Diagnosis Date  . Anxiety   . Morbid obesity ([place]) 10/09/2016  . Pituitary tumor   . Polycystic ovary disease   . 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 Paroxysmal tachycardia, unspecified ([place]) 05/26/2019  . Iron deficiency anemia 12/13/2016  . Morbid obesity ([place]) 10/09/2016  . Tachycardia 01/08/2012  . 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 POLYCYSTIC OVARIAN DISEASE 02/27/2008  . Overweight 02/27/2008  . ALLERGIC RHINITIS 02/27/2008    Past Surgical History:  Procedure Laterality Date  . 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)    . TUBAL LIGATION     right fallopian tube removed  . tube removed       OB History   No obstetric history on file.     Family History  Problem Relation Age of Onset  . Hypertension Father   . Heart failure Father   . Hypertension Mother   . Heart failure Brother     Social History   Tobacco Use  . Smoking status: Never Smoker  . Smokeless tobacco: Never Used  Substance Use Topics  . Alcohol use: No  . Drug use: No    Home Medications Prior to Admission medications   Medication Sig Start Date End  Date Taking? Authorizing Provider  albuterol (PROVENTIL HFA;VENTOLIN HFA) 108 (90 BASE) MCG/ACT inhaler Inhale 2 puffs into the lungs every 4 (four) hours as needed for wheezing or shortness of breath.    [provider]  bromocriptine (PARLODEL) 2.5 MG tablet 2 tablets daily.    [provider]  Calcium Citrate-Vitamin D (CALCIUM CITRATE +D PO) 600 capsules. 4 times a day    [provider]  diazepam (VALIUM) 5 MG tablet Take 1 tablet (5 mg total) by mouth every 8 (eight) hours as needed for muscle spasms. 02/23/18   Long, Wonda Olds, MD  diltiazem (CARDIZEM) 30 MG tablet Take 1 tablet (30 mg total) by mouth 3 (three) times daily as needed (for palpitations). 06/30/19   Miquel Dunn, NP  folic acid (FOLVITE) 782 MCG tablet Take 800 mcg by mouth 2 (two) times daily.    [provider]  Glucosamine Sulfate 1000 MG CAPS 2 (two) times daily.     [provider]  Inositol-Folic Acid (PREGNITUDE PO) Take by mouth 2 (two) times a day.    [provider]  iron polysaccharides (NIFEREX) 150 MG capsule Take 150 mg by mouth 2 (two) times daily.    [provider]  Magnesium 100 MG CAPS 800 mg 2 (two) times a day.    [provider]  Multiple Vitamin (MULTIVITAMIN)  tablet Take 1 tablet by mouth 2 (two) times a day.    [provider]  RABEprazole (ACIPHEX) 20 MG tablet daily.    [provider]  sertraline (ZOLOFT) 100 MG tablet Take 150 mg by mouth daily.     [provider]  TURMERIC PO Take 800 mg by mouth 2 (two) times a day.    [provider]  Vitamin A 7.5 MG (25000 UT) CAPS Take 25,000 Units by mouth daily.     [provider]  VITAMIN D PO Take 50,000 Units by mouth every morning.     [provider]    Allergies    Butorphanol  Review of Systems   Review of Systems  All other systems reviewed and are negative.   Physical Exam Updated Vital Signs BP 131/64 (BP  Location: Right Arm)   Pulse 72   Temp 98.3 F (36.8 C) (Oral)   Resp 18   Ht 5\' 7"  (1.702 m)   Wt 90 kg   LMP 01/09/2020   SpO2 100%   BMI 31.08 kg/m   Physical Exam Vitals and nursing note reviewed.  Constitutional:      General: She is not in acute distress.    Appearance: She is well-developed.  HENT:     Head: Normocephalic and atraumatic.  Eyes:     Conjunctiva/sclera: Conjunctivae normal.  Cardiovascular:     Rate and Rhythm: Normal rate and regular rhythm.     Heart sounds: No murmur.  Pulmonary:     Effort: Pulmonary effort is normal. No respiratory distress.     Breath sounds: Normal breath sounds.  Abdominal:     Palpations: Abdomen is soft.     Tenderness: There is abdominal tenderness.     Comments: No significant abdominal tenderness, there is mild abdominal discomfort on the right side with some right CVA tenderness  Musculoskeletal:        General: Normal range of motion.     Cervical back: Neck supple.  Skin:    General: Skin is warm and dry.  Neurological:     Mental Status: She is alert and oriented to person, place, and time.  Psychiatric:        Mood and Affect: Mood normal.        Behavior: Behavior normal.     ED Results / Procedures / Treatments   Labs (all labs ordered are listed, but only abnormal results are displayed) Labs Reviewed  CBC WITH DIFFERENTIAL/PLATELET - Abnormal; Notable for the following components:      Result Value   Hemoglobin 11.1 (*)    HCT 35.0 (*)    All other components within normal limits  COMPREHENSIVE METABOLIC PANEL - Abnormal; Notable for the following components:   CO2 21 (*)    Glucose, Bld 128 (*)    BUN 21 (*)    All other components within normal limits  URINALYSIS, ROUTINE W REFLEX MICROSCOPIC - Abnormal; Notable for the following components:   APPearance HAZY (*)    Ketones, ur 20 (*)    Leukocytes,Ua TRACE (*)    All other components within normal limits  I-STAT BETA HCG BLOOD, ED ([facility], [facility], [facility]  ONLY) - Abnormal; Notable for the following components:   I-stat hCG, quantitative 7.7 (*)    All other components within normal limits  PREGNANCY, URINE    EKG None  Radiology No results found.  Procedures Procedures (including critical care time)  Medications Ordered in ED Medications  oxyCODONE-acetaminophen (PERCOCET/ROXICET) 5-325 MG per tablet 1 tablet (1 tablet Oral Given 01/23/20 0044)    ED Course  I have reviewed the triage vital signs and the nursing notes.  Pertinent labs & imaging results that were available during my care of the patient were reviewed by me and considered in my medical decision making (see chart for details).    MDM Rules/Calculators/A&P                      Patient with right-sided flank pain and right-sided abdominal pain.  Sudden onset last night.  She is afebrile.  She has had some nausea and vomiting.  She is somewhat tender.  Will check CT.  Laboratory work-up notable for abnormal urinalysis.  Also i-STAT hCG is 7.7, but I suspect this is a false positive based on the value.  Urine pregnancy test negative.  Patient signed out to oncoming team.  Plan: Follow-up on CT abdomen/pelvis.  Most suspicious for kidney stone, but given patient's prior abdominal surgeries, CT abdomen/pelvis with contrast was ordered.    Final Clinical Impression(s) / ED Diagnoses Final diagnoses:  None    Rx / DC Orders ED Discharge Orders    None       Montine Circle, PA-C 01/23/20 5300    Orpah Greek, MD 01/23/20 [phone]

## 2020-01-23 NOTE — ED Notes (Signed)
Transported to CT 

## 2020-01-23 NOTE — ED Provider Notes (Signed)
Care assumed from [employer], [place], at shift change, please see their notes for full documentation of patient's complaint/HPI. Briefly, pt here with R flank pain and right lower abdominal pain, N/V. Results so far show U/A with trace leuks, 21-50 RBCs and 6-10 WBCs, no bacteria. Beta hcg positive at 7.7 however urine preg negative. Awaiting CT A/P with contrast. Plan is to dispo accordingly.   Physical Exam  BP 131/64 (BP Location: Right Arm)   Pulse 72   Temp 98.3 F (36.8 C) (Oral)   Resp 18   Ht 5\' 7"  (1.702 m)   Wt 90 kg   LMP 01/09/2020   SpO2 100%   BMI 31.08 kg/m   Physical Exam  ED Course/Procedures     Procedures Results for orders placed or performed during the hospital encounter of 01/23/20  CBC with Differential  Result Value Ref Range   WBC 4.9 4.0 - 10.5 K/uL   RBC 4.00 3.87 - 5.11 MIL/uL   Hemoglobin 11.1 (L) 12.0 - 15.0 g/dL   HCT 35.0 (L) 36.0 - 46.0 %   MCV 87.5 80.0 - 100.0 fL   MCH 27.8 26.0 - 34.0 pg   MCHC 31.7 30.0 - 36.0 g/dL   RDW 13.7 11.5 - 15.5 %   Platelets 203 150 - 400 K/uL   nRBC 0.0 0.0 - 0.2 %   Neutrophils Relative % 51 %   Neutro Abs 2.5 1.7 - 7.7 K/uL   Lymphocytes Relative 42 %   Lymphs Abs 2.1 0.7 - 4.0 K/uL   Monocytes Relative 6 %   Monocytes Absolute 0.3 0.1 - 1.0 K/uL   Eosinophils Relative 0 %   Eosinophils Absolute 0.0 0.0 - 0.5 K/uL   Basophils Relative 1 %   Basophils Absolute 0.0 0.0 - 0.1 K/uL   Immature Granulocytes 0 %   Abs Immature Granulocytes 0.02 0.00 - 0.07 K/uL  Comprehensive metabolic panel  Result Value Ref Range   Sodium 139 135 - 145 mmol/L   Potassium 3.8 3.5 - 5.1 mmol/L   Chloride 106 98 - 111 mmol/L   CO2 21 (L) 22 - 32 mmol/L   Glucose, Bld 128 (H) 70 - 99 mg/dL   BUN 21 (H) 6 - 20 mg/dL   Creatinine, Ser 0.94 0.44 - 1.00 mg/dL   Calcium 9.6 8.9 - 10.3 mg/dL   Total Protein 7.2 6.5 - 8.1 g/dL   Albumin 4.3 3.5 - 5.0 g/dL   AST 26 15 - 41 U/L   ALT 27 0 - 44 U/L   Alkaline Phosphatase 92 38  - 126 U/L   Total Bilirubin 0.6 0.3 - 1.2 mg/dL   GFR calc non Af Amer >60 >60 mL/min   GFR calc Af Amer >60 >60 mL/min   Anion gap 12 5 - 15  Urinalysis, Routine w reflex microscopic  Result Value Ref Range   Color, Urine YELLOW YELLOW   APPearance HAZY (A) CLEAR   Specific Gravity, Urine 1.025 1.005 - 1.030   pH 5.0 5.0 - 8.0   Glucose, UA NEGATIVE NEGATIVE mg/dL   Hgb urine dipstick NEGATIVE NEGATIVE   Bilirubin Urine NEGATIVE NEGATIVE   Ketones, ur 20 (A) NEGATIVE mg/dL   Protein, ur NEGATIVE NEGATIVE mg/dL   Nitrite NEGATIVE NEGATIVE   Leukocytes,Ua TRACE (A) NEGATIVE   RBC / HPF 21-50 0 - 5 RBC/hpf   WBC, UA 6-10 0 - 5 WBC/hpf   Bacteria, UA NONE SEEN NONE SEEN   Squamous Epithelial / LPF  0-5 0 - 5   Mucus PRESENT   Pregnancy, urine  Result Value Ref Range   Preg Test, Ur NEGATIVE NEGATIVE  I-Stat Beta hCG blood, ED ([facility], [facility], [facility] only)  Result Value Ref Range   I-stat hCG, quantitative 7.7 (H) <5 mIU/mL   Comment 3           CT ABDOMEN PELVIS W CONTRAST  Result Date: 01/23/2020 CLINICAL DATA:  Right lower quadrant pain EXAM: CT ABDOMEN AND PELVIS WITH CONTRAST TECHNIQUE: Multidetector CT imaging of the abdomen and pelvis was performed using the standard protocol following bolus administration of intravenous contrast. CONTRAST:  183mL OMNIPAQUE IOHEXOL 300 MG/ML  SOLN COMPARISON:  None. FINDINGS: Lower chest: No acute abnormality. Hepatobiliary: No focal liver abnormality is seen. No gallstones, gallbladder wall thickening, or biliary dilatation. Pancreas: Unremarkable. Spleen: Unremarkable. Adrenals/Urinary Tract: Adrenals and left kidney are unremarkable. There is right hydronephrosis. Multiple calculi within the right lower pole collecting system individually measuring to 4 mm. There is hydroureter to the level of an obstructing distal ureteral calculus or cluster of calculi measuring 9 x 4 mm coronally. Bladder is unremarkable. Stomach/Bowel: Post sleeve gastrectomy. Bowel is  normal in caliber. There is stool throughout the colon. Appendix is not definitely visualized. Vascular/Lymphatic: No significant vascular findings are present. No enlarged abdominal or pelvic lymph nodes. Reproductive: Uterus and bilateral adnexa are unremarkable. Other: Trace free fluid. Postoperative changes in the ventral abdominal wall. Musculoskeletal: No acute osseous abnormality. IMPRESSION: Obstructing distal ureteral calculus or cluster of calculi measuring 9 x 4 mm. There is moderate right hydroureteronephrosis. Multiple nonobstructing calculi within the right lower pole collecting system. Electronically Signed   By: Macy Mis M.D.   On: 01/23/2020 08:57    MDM  Nursing staff informed of returning pain after CT scan - toradol provided.  CT scan  IMPRESSION:  Obstructing distal ureteral calculus or cluster of calculi measuring  9 x 4 mm. There is moderate right hydroureteronephrosis.    Multiple nonobstructing calculi within the right lower pole  collecting system.   Discussed case with attending physician Dr. Stark Jock - does not think urology needs to be involved at this point. Will discharge home with flomax, pain medicine, and zofran PRN. Pt advised to follow up with [facility] next week for further eval given size of stone. Strict return precautions discussed with pt including worsening pain, intractible vomiting, fevers > 100.4, inability to void. Pt is in agreement with plan and stable for discharge home.   This note was prepared using Dragon voice recognition software and may include unintentional dictation errors due to the inherent limitations of voice recognition software.       Eustaquio Maize, PA-C 01/23/20 [id number]    Veryl Speak, MD 01/23/20 1616

## 2020-01-23 NOTE — ED Triage Notes (Signed)
Patient reports right flank pain radiating to right lateral abdomen with emesis this evening , no hematuria or dysuria .

## 2020-01-27 ENCOUNTER — Other Ambulatory Visit (HOSPITAL_COMMUNITY)
Admission: RE | Admit: 2020-01-27 | Discharge: 2020-01-27 | Disposition: A | Discharge status: Home / Self Care | Payer: BC Managed Care – PPO | Source: Ambulatory Visit | Attending: Urology | Admitting: Urology

## 2020-01-27 ENCOUNTER — Other Ambulatory Visit: Payer: Self-pay | Admitting: Urology

## 2020-01-27 DIAGNOSIS — Z20822 Contact with and (suspected) exposure to covid-19: Secondary | ICD-10-CM | POA: Insufficient documentation

## 2020-01-27 DIAGNOSIS — Z01812 Encounter for preprocedural laboratory examination: Secondary | ICD-10-CM | POA: Diagnosis not present

## 2020-01-27 DIAGNOSIS — R351 Nocturia: Secondary | ICD-10-CM | POA: Diagnosis not present

## 2020-01-27 DIAGNOSIS — R350 Frequency of micturition: Secondary | ICD-10-CM | POA: Diagnosis not present

## 2020-01-27 DIAGNOSIS — N201 Calculus of ureter: Secondary | ICD-10-CM | POA: Diagnosis not present

## 2020-01-27 DIAGNOSIS — R8271 Bacteriuria: Secondary | ICD-10-CM | POA: Diagnosis not present

## 2020-01-27 LAB — SARS CORONAVIRUS 2 (TAT 6-24 HRS): SARS Coronavirus 2: NEGATIVE

## 2020-01-27 NOTE — Progress Notes (Signed)
Talked with patient.Instructions given for [facility] on Thursday. NPO after MN. Arrival time 1030.

## 2020-01-29 ENCOUNTER — Ambulatory Visit (HOSPITAL_BASED_OUTPATIENT_CLINIC_OR_DEPARTMENT_OTHER): Admission: RE | Admit: 2020-01-29 | Payer: BC Managed Care – PPO | Source: Ambulatory Visit | Admitting: Urology

## 2020-01-29 SURGERY — LITHOTRIPSY, ESWL
Anesthesia: LOCAL | Laterality: Right

## 2020-02-13 DIAGNOSIS — R350 Frequency of micturition: Secondary | ICD-10-CM | POA: Diagnosis not present

## 2020-02-13 DIAGNOSIS — N201 Calculus of ureter: Secondary | ICD-10-CM | POA: Diagnosis not present

## 2020-02-16 DIAGNOSIS — Z0000 Encounter for general adult medical examination without abnormal findings: Secondary | ICD-10-CM | POA: Diagnosis not present

## 2020-02-16 DIAGNOSIS — E559 Vitamin D deficiency, unspecified: Secondary | ICD-10-CM | POA: Diagnosis not present

## 2020-02-16 DIAGNOSIS — I10 Essential (primary) hypertension: Secondary | ICD-10-CM | POA: Diagnosis not present

## 2020-02-16 DIAGNOSIS — D649 Anemia, unspecified: Secondary | ICD-10-CM | POA: Diagnosis not present

## 2020-02-24 DIAGNOSIS — E282 Polycystic ovarian syndrome: Secondary | ICD-10-CM | POA: Diagnosis not present

## 2020-02-24 DIAGNOSIS — R319 Hematuria, unspecified: Secondary | ICD-10-CM | POA: Diagnosis not present

## 2020-02-24 DIAGNOSIS — I10 Essential (primary) hypertension: Secondary | ICD-10-CM | POA: Diagnosis not present

## 2020-02-24 DIAGNOSIS — R7301 Impaired fasting glucose: Secondary | ICD-10-CM | POA: Diagnosis not present

## 2020-02-24 DIAGNOSIS — D509 Iron deficiency anemia, unspecified: Secondary | ICD-10-CM | POA: Diagnosis not present

## 2020-02-24 DIAGNOSIS — Z0000 Encounter for general adult medical examination without abnormal findings: Secondary | ICD-10-CM | POA: Diagnosis not present

## 2020-02-25 DIAGNOSIS — M25552 Pain in left hip: Secondary | ICD-10-CM | POA: Diagnosis not present

## 2020-02-26 DIAGNOSIS — N201 Calculus of ureter: Secondary | ICD-10-CM | POA: Diagnosis not present

## 2020-03-05 ENCOUNTER — Emergency Department (HOSPITAL_COMMUNITY)
Admission: EM | Admit: 2020-03-05 | Discharge: 2020-03-06 | Disposition: A | Discharge status: Home / Self Care | Payer: BC Managed Care – PPO | Attending: Emergency Medicine | Admitting: Emergency Medicine

## 2020-03-05 ENCOUNTER — Encounter (HOSPITAL_COMMUNITY): Payer: Self-pay

## 2020-03-05 DIAGNOSIS — I471 Supraventricular tachycardia: Secondary | ICD-10-CM | POA: Insufficient documentation

## 2020-03-05 DIAGNOSIS — R748 Abnormal levels of other serum enzymes: Secondary | ICD-10-CM | POA: Diagnosis not present

## 2020-03-05 DIAGNOSIS — R0689 Other abnormalities of breathing: Secondary | ICD-10-CM | POA: Diagnosis not present

## 2020-03-05 DIAGNOSIS — R0902 Hypoxemia: Secondary | ICD-10-CM | POA: Diagnosis not present

## 2020-03-05 DIAGNOSIS — R778 Other specified abnormalities of plasma proteins: Secondary | ICD-10-CM

## 2020-03-05 DIAGNOSIS — R0602 Shortness of breath: Secondary | ICD-10-CM | POA: Diagnosis not present

## 2020-03-05 LAB — BASIC METABOLIC PANEL
Anion gap: 9 (ref 5–15)
BUN: 16 mg/dL (ref 6–20)
CO2: 25 mmol/L (ref 22–32)
Calcium: 9.6 mg/dL (ref 8.9–10.3)
Chloride: 108 mmol/L (ref 98–111)
Creatinine, Ser: 0.78 mg/dL (ref 0.44–1.00)
GFR calc Af Amer: >60 mL/min (ref >60)
GFR calc non Af Amer: >60 mL/min (ref >60)
Glucose, Bld: 58 mg/dL — ABNORMAL LOW (ref 70–99)
Potassium: 4.2 mmol/L (ref 3.5–5.1)
Sodium: 142 mmol/L (ref 135–145)

## 2020-03-05 LAB — CBC
HCT: 37.2 % (ref 36.0–46.0)
Hemoglobin: 11.8 g/dL — ABNORMAL LOW (ref 12.0–15.0)
MCH: 27.7 pg (ref 26.0–34.0)
MCHC: 31.7 g/dL (ref 30.0–36.0)
MCV: 87.3 fL (ref 80.0–100.0)
Platelets: 210 10*3/uL (ref 150–400)
RBC: 4.26 MIL/uL (ref 3.87–5.11)
RDW: 14.6 % (ref 11.5–15.5)
WBC: 3.7 10*3/uL — ABNORMAL LOW (ref 4.0–10.5)
nRBC: 0 % (ref 0.0–0.2)

## 2020-03-05 LAB — I-STAT BETA HCG BLOOD, ED (MC, WL, AP ONLY): I-stat hCG, quantitative: 25.9 m[IU]/mL — ABNORMAL HIGH (ref <5)

## 2020-03-05 LAB — TROPONIN I (HIGH SENSITIVITY): Troponin I (High Sensitivity): 3 ng/L (ref <18)

## 2020-03-05 MED ORDER — SODIUM CHLORIDE 0.9% FLUSH: 3.0000 mL | Freq: Once | INTRAVENOUS | Status: DC

## 2020-03-05 NOTE — ED Triage Notes (Signed)
Pt arrives to ED via [facility] from home w/ c/o palpitations. HR initially 190-200 w/ EMS, however, prior to getting EKG of pt EMS had pt attempt vagal maneuvers and HR came down to 100. On arrival to triage HR 88, NSR.    EMS VS: 130/96, 98% RA, HR 88 NSR

## 2020-03-06 DIAGNOSIS — I471 Supraventricular tachycardia: Secondary | ICD-10-CM | POA: Diagnosis not present

## 2020-03-06 LAB — TROPONIN I (HIGH SENSITIVITY)
Troponin I (High Sensitivity): 22 ng/L — ABNORMAL HIGH (ref <18)
Troponin I (High Sensitivity): 39 ng/L — ABNORMAL HIGH (ref <18)

## 2020-03-06 LAB — HCG, QUANTITATIVE, PREGNANCY: hCG, Beta Chain, Quant, S: 2 m[IU]/mL (ref <5)

## 2020-03-06 LAB — CBG MONITORING, ED
Glucose-Capillary: 58 mg/dL — ABNORMAL LOW (ref 70–99)
Glucose-Capillary: 121 mg/dL — ABNORMAL HIGH (ref 70–99)

## 2020-03-06 NOTE — ED Notes (Signed)
MD Roxanne Mins made aware of pt CBG of 58 and that orange juice and graham crackers given

## 2020-03-06 NOTE — ED Provider Notes (Signed)
[facility] EMERGENCY DEPARTMENT Provider Note   CSN: [id number] Arrival date & time: 03/05/20  2111   History Chief Complaint  Patient presents with  . Tachycardia    Sarah Saunders is a 44 y.o. female.  The history is provided by the patient.  She has history of SVT and comes in because of sudden onset of her heart racing.  Heart rate was reported to be over 200.  She did try Valsalva maneuver at home without any benefit.  EMS arrived and directed her to forcibly exhale through a syringe and after 3 attempts, she successfully converted to heart rate of 80.  There were no strips obtained while she had her tachyarrhythmia.  She denies any chest pain, heaviness, tightness, pressure during the episode, but afterwards, did feel like there was a lump in her throat.  That symptom resolved after about 10 minutes.  She has been in the ED now for over 7 hours and has had no further symptoms.  Past Medical History:  Diagnosis Date  . Anxiety   . Morbid obesity ([place]) 10/09/2016  . Pituitary tumor   . Polycystic ovary disease   . 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 Paroxysmal tachycardia, unspecified ([place]) 05/26/2019  . Iron deficiency anemia 12/13/2016  . Morbid obesity ([place]) 10/09/2016  . Tachycardia 01/08/2012  . 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 POLYCYSTIC OVARIAN DISEASE 02/27/2008  . Overweight 02/27/2008  . ALLERGIC RHINITIS 02/27/2008    Past Surgical History:  Procedure Laterality Date  . 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)    . TUBAL LIGATION     right fallopian tube removed  . tube removed       OB History   No obstetric history on file.     Family History  Problem Relation Age of Onset  . Hypertension Father   . Heart failure Father   . Hypertension Mother   . Heart failure Brother     Social History   Tobacco Use  . Smoking status: Never Smoker  . Smokeless  tobacco: Never Used  Vaping Use  . Vaping Use: Never used  Substance Use Topics  . Alcohol use: No  . Drug use: No    Home Medications Prior to Admission medications   Medication Sig Start Date End Date Taking? Authorizing Provider  albuterol (PROVENTIL HFA;VENTOLIN HFA) 108 (90 BASE) MCG/ACT inhaler Inhale 2 puffs into the lungs every 4 (four) hours as needed for wheezing or shortness of breath.    [provider]  bromocriptine (PARLODEL) 2.5 MG tablet 2 tablets daily.    [provider]  Calcium Citrate-Vitamin D (CALCIUM CITRATE +D PO) Take 1 capsule by mouth in the morning, at noon, in the evening, and at bedtime. 4 times a day    [provider]  diazepam (VALIUM) 5 MG tablet Take 1 tablet (5 mg total) by mouth every 8 (eight) hours as needed for muscle spasms. 02/23/18   Long, Wonda Olds, MD  diltiazem (CARDIZEM) 30 MG tablet Take 1 tablet (30 mg total) by mouth 3 (three) times daily as needed (for palpitations). 06/30/19   Miquel Dunn, NP  folic acid (FOLVITE) 324 MCG tablet Take 800 mcg by mouth 2 (two) times daily.    [provider]  Glucosamine Sulfate 1000 MG CAPS 2 (two) times daily.     [provider]  Inositol-Folic Acid (PREGNITUDE PO) Take 1 packet  by mouth 2 (two) times a day. Mix in 4oz of water or juice    [provider]  iron polysaccharides (NIFEREX) 150 MG capsule Take 150 mg by mouth 2 (two) times daily.    [provider]  Magnesium 400 MG TABS Take 800 mg by mouth 2 (two) times a day.     [provider]  Multiple Vitamin (MULTIVITAMIN) tablet Take 1 tablet by mouth 2 (two) times a day.    [provider]  ondansetron (ZOFRAN ODT) 4 MG disintegrating tablet Take 1 tablet (4 mg total) by mouth every 8 (eight) hours as needed for nausea or vomiting. 01/23/20   Eustaquio Maize, PA-C  oxyCODONE-acetaminophen (PERCOCET/ROXICET) 5-325 MG tablet Take 1 tablet by mouth every 6 (six) hours  as needed for severe pain. 01/23/20   Alroy Bailiff, Margaux, PA-C  RABEprazole (ACIPHEX) 20 MG tablet Take 20 mg by mouth daily.     [provider]  sertraline (ZOLOFT) 100 MG tablet Take 150 mg by mouth daily.     [provider]  TURMERIC PO Take 800 mg by mouth 2 (two) times a day.    [provider]  Vitamin A 7.5 MG (25000 UT) CAPS Take 25,000 Units by mouth daily.     [provider]  VITAMIN D PO Take 50,000 Units by mouth every morning.     [provider]    Allergies    Butorphanol  Review of Systems   Review of Systems  All other systems reviewed and are negative.   Physical Exam Updated Vital Signs BP (!) 123/92 (BP Location: Left Arm)   Pulse 82   Temp 98 F (36.7 C) (Oral)   Resp 17   SpO2 98%   Physical Exam Vitals and nursing note reviewed.   44 year old female, resting comfortably and in no acute distress. Vital signs are normal. Oxygen saturation is 98%, which is normal. Head is normocephalic and atraumatic. PERRLA, EOMI. Oropharynx is clear. Neck is nontender and supple without adenopathy or JVD. Back is nontender and there is no CVA tenderness. Lungs are clear without rales, wheezes, or rhonchi. Chest is nontender. Heart has regular rate and rhythm without murmur. Abdomen is soft, flat, nontender without masses or hepatosplenomegaly and peristalsis is normoactive. Extremities have no cyanosis or edema, full range of motion is present. Skin is warm and dry without rash. Neurologic: Mental status is normal, cranial nerves are intact, there are no motor or sensory deficits.  ED Results / Procedures / Treatments   Labs (all labs ordered are listed, but only abnormal results are displayed) Labs Reviewed  BASIC METABOLIC PANEL - Abnormal; Notable for the following components:      Result Value   Glucose, Bld 58 (*)    All other components within normal limits  CBC - Abnormal; Notable for the following components:    WBC 3.7 (*)    Hemoglobin 11.8 (*)    All other components within normal limits  I-STAT BETA HCG BLOOD, ED ([facility], [facility], [facility] ONLY) - Abnormal; Notable for the following components:   I-stat hCG, quantitative 25.9 (*)    All other components within normal limits  CBG MONITORING, ED - Abnormal; Notable for the following components:   Glucose-Capillary 58 (*)    All other components within normal limits  CBG MONITORING, ED - Abnormal; Notable for the following components:   Glucose-Capillary 121 (*)    All other components within normal limits  TROPONIN I (HIGH  SENSITIVITY) - Abnormal; Notable for the following components:   Troponin I (High Sensitivity) 22 (*)    All other components within normal limits  TROPONIN I (HIGH SENSITIVITY) - Abnormal; Notable for the following components:   Troponin I (High Sensitivity) 39 (*)    All other components within normal limits  HCG, QUANTITATIVE, PREGNANCY  TROPONIN I (HIGH SENSITIVITY)    EKG EKG Interpretation  Date/Time:  Friday March 05 2020 21:19:08 EDT Ventricular Rate:  90 PR Interval:  158 QRS Duration: 82 QT Interval:  338 QTC Calculation: 413 R Axis:   41 Text Interpretation: Normal sinus rhythm Nonspecific T wave abnormality Abnormal ECG When compared with ECG of 09/13/2018, Nonspecific T wave abnormality is now present Confirmed by Delora Fuel ([id number]) on 03/06/2020 12:13:18 AM  Procedures Procedures  Medications Ordered in ED Medications  sodium chloride flush (NS) 0.9 % injection 3 mL (has no administration in time range)    ED Course  I have reviewed the triage vital signs and the nursing notes.  Pertinent labs & imaging results that were available during my care of the patient were reviewed by me and considered in my medical decision making (see chart for details).  MDM Rules/Calculators/A&P Transient episode of tachycardia which is likely to be supraventricular tachycardia given response to vagal maneuver.  Old records are  reviewed, and she has been seen by cardiology with diagnosis of paroxysmal supraventricular tachycardia.  ECG shows some minor nonspecific T wave changes.  Labs show borderline anemia, borderline hypoglycemia.  Initial troponin was normal, but subsequent troponin did increase significantly.  This is felt to be due to demand ischemia secondary to her very rapid heart rate.  Anemia is actually improved over her baseline.  We will recheck CBG.  Curiously, point-of-care hCG is slightly elevated.  Patient states she has not had a menses in several months and is not using contraception.  Will check regular labs hCG level.  Pain lab HCG is normal, patient is not pregnant.  Troponin has increased slightly, but not to a range that would be considered anything more than demand ischemia from.  Of a severe tachycardia.  She is felt to be safe for discharge.  She is referred back to her cardiologist for follow-up.  Final Clinical Impression(s) / ED Diagnoses Final diagnoses:  SVT (supraventricular tachycardia) ([place])  Elevated troponin    Rx / DC Orders ED Discharge Orders    None       Delora Fuel, MD 21/11/73 [phone]

## 2020-03-06 NOTE — Discharge Instructions (Addendum)
Return if you are having any problems. 

## 2020-03-08 ENCOUNTER — Encounter: Payer: Self-pay | Admitting: Cardiology

## 2020-03-21 DIAGNOSIS — Z20822 Contact with and (suspected) exposure to covid-19: Secondary | ICD-10-CM | POA: Diagnosis not present

## 2020-03-23 DIAGNOSIS — Z20822 Contact with and (suspected) exposure to covid-19: Secondary | ICD-10-CM | POA: Diagnosis not present

## 2020-05-25 ENCOUNTER — Ambulatory Visit: Payer: 59 | Admitting: Cardiology

## 2020-08-04 DIAGNOSIS — R7301 Impaired fasting glucose: Secondary | ICD-10-CM | POA: Diagnosis not present

## 2020-08-04 DIAGNOSIS — D649 Anemia, unspecified: Secondary | ICD-10-CM | POA: Diagnosis not present

## 2020-08-04 DIAGNOSIS — I10 Essential (primary) hypertension: Secondary | ICD-10-CM | POA: Diagnosis not present

## 2020-08-04 DIAGNOSIS — G43109 Migraine with aura, not intractable, without status migrainosus: Secondary | ICD-10-CM | POA: Diagnosis not present

## 2020-09-23 DIAGNOSIS — F4323 Adjustment disorder with mixed anxiety and depressed mood: Secondary | ICD-10-CM | POA: Diagnosis not present

## 2020-09-28 DIAGNOSIS — R7301 Impaired fasting glucose: Secondary | ICD-10-CM | POA: Diagnosis not present

## 2020-09-28 DIAGNOSIS — E221 Hyperprolactinemia: Secondary | ICD-10-CM | POA: Diagnosis not present

## 2020-09-28 DIAGNOSIS — E559 Vitamin D deficiency, unspecified: Secondary | ICD-10-CM | POA: Diagnosis not present

## 2020-09-28 DIAGNOSIS — D443 Neoplasm of uncertain behavior of pituitary gland: Secondary | ICD-10-CM | POA: Diagnosis not present

## 2020-09-28 DIAGNOSIS — Z9884 Bariatric surgery status: Secondary | ICD-10-CM | POA: Diagnosis not present

## 2020-10-05 ENCOUNTER — Other Ambulatory Visit: Payer: Self-pay | Admitting: Endocrinology

## 2020-10-05 DIAGNOSIS — D443 Neoplasm of uncertain behavior of pituitary gland: Secondary | ICD-10-CM | POA: Diagnosis not present

## 2020-10-05 DIAGNOSIS — E282 Polycystic ovarian syndrome: Secondary | ICD-10-CM | POA: Diagnosis not present

## 2020-10-05 DIAGNOSIS — E221 Hyperprolactinemia: Secondary | ICD-10-CM | POA: Diagnosis not present

## 2020-10-05 DIAGNOSIS — I10 Essential (primary) hypertension: Secondary | ICD-10-CM | POA: Diagnosis not present

## 2020-10-12 DIAGNOSIS — D443 Neoplasm of uncertain behavior of pituitary gland: Secondary | ICD-10-CM | POA: Diagnosis not present

## 2020-10-21 DIAGNOSIS — F4323 Adjustment disorder with mixed anxiety and depressed mood: Secondary | ICD-10-CM | POA: Diagnosis not present

## 2020-10-30 ENCOUNTER — Ambulatory Visit
Admission: RE | Admit: 2020-10-30 | Discharge: 2020-10-30 | Disposition: A | Discharge status: Home / Self Care | Payer: BC Managed Care – PPO | Source: Ambulatory Visit | Attending: Endocrinology | Admitting: Endocrinology

## 2020-10-30 DIAGNOSIS — D443 Neoplasm of uncertain behavior of pituitary gland: Secondary | ICD-10-CM

## 2020-10-30 DIAGNOSIS — G43909 Migraine, unspecified, not intractable, without status migrainosus: Secondary | ICD-10-CM | POA: Diagnosis not present

## 2020-10-30 DIAGNOSIS — D352 Benign neoplasm of pituitary gland: Secondary | ICD-10-CM | POA: Diagnosis not present

## 2020-10-30 MED ORDER — GADOBENATE DIMEGLUMINE 529 MG/ML IV SOLN
10.0000 mL | Freq: Once | INTRAVENOUS | Status: AC | PRN
  Administered 2020-10-30: 10 mL via INTRAVENOUS

## 2020-11-16 DIAGNOSIS — F4323 Adjustment disorder with mixed anxiety and depressed mood: Secondary | ICD-10-CM | POA: Diagnosis not present

## 2020-12-01 DIAGNOSIS — M25552 Pain in left hip: Secondary | ICD-10-CM | POA: Diagnosis not present

## 2020-12-04 DIAGNOSIS — R109 Unspecified abdominal pain: Secondary | ICD-10-CM | POA: Diagnosis not present

## 2020-12-04 DIAGNOSIS — Z3202 Encounter for pregnancy test, result negative: Secondary | ICD-10-CM | POA: Diagnosis not present

## 2020-12-09 DIAGNOSIS — I10 Essential (primary) hypertension: Secondary | ICD-10-CM | POA: Diagnosis not present

## 2020-12-09 DIAGNOSIS — G43109 Migraine with aura, not intractable, without status migrainosus: Secondary | ICD-10-CM | POA: Diagnosis not present

## 2020-12-09 DIAGNOSIS — D649 Anemia, unspecified: Secondary | ICD-10-CM | POA: Diagnosis not present

## 2020-12-09 DIAGNOSIS — Z01818 Encounter for other preprocedural examination: Secondary | ICD-10-CM | POA: Diagnosis not present

## 2020-12-09 DIAGNOSIS — R7301 Impaired fasting glucose: Secondary | ICD-10-CM | POA: Diagnosis not present

## 2020-12-10 NOTE — Progress Notes (Signed)
Patient referred by Merrilee Seashore, MD for pre-op risk stratification  Subjective:   Sarah Saunders, female    DOB: 1976-01-31, 45 y.o.   MRN: [id number]   Chief Complaint  Patient presents with  . Medical Clearance    Hip replacement     HPI  45 y.o. African-American female with history of paroxysmal supraventricular tachycardia, here for preop risk stratification.  Patient was last seen by Jeri Lager, FNP: [phone] for paroxysmal supraventricular tachycardia.  No significant arrhythmia was noted on event monitor.  Exercise treadmill stress test was normal. Recently, physical activity has been limited due to left hip pain for which she is going to undergo hip arthroplasty in the near future.  She denies any chest pain, significant shortness of breath, leg edema, orthopnea, daily symptoms.  She still able to climb a flight of stairs with minimal cardiac symptoms.  Patient was seen in emergency room in July 2020 with an episode of supraventricular tachycardia, but patient reports normal heart rate reached about 100.  At that time, her troponin was mildly elevated, most likely supply demand mismatch in the setting of supraventricular tachycardia.   Current Outpatient Medications on File Prior to Visit  Medication Sig Dispense Refill  . albuterol (PROVENTIL HFA;VENTOLIN HFA) 108 (90 BASE) MCG/ACT inhaler Inhale 2 puffs into the lungs every 4 (four) hours as needed for wheezing or shortness of breath.    . bromocriptine (PARLODEL) 2.5 MG tablet 2 tablets daily.    . Calcium Citrate-Vitamin D (CALCIUM CITRATE +D PO) Take 1 capsule by mouth in the morning, at noon, in the evening, and at bedtime. 4 times a day    . diazepam (VALIUM) 5 MG tablet Take 1 tablet (5 mg total) by mouth every 8 (eight) hours as needed for muscle spasms. 10 tablet 0  . diltiazem (CARDIZEM) 30 MG tablet Take 1 tablet (30 mg total) by mouth 3 (three) times daily as needed (for palpitations). 30 tablet 3  .  folic acid (FOLVITE) 762 MCG tablet Take 800 mcg by mouth 2 (two) times daily.    . Glucosamine Sulfate 1000 MG CAPS 2 (two) times daily.     . Inositol-Folic Acid (PREGNITUDE PO) Take 1 packet by mouth 2 (two) times a day. Mix in 4oz of water or juice    . iron polysaccharides (NIFEREX) 150 MG capsule Take 150 mg by mouth 2 (two) times daily.    . Magnesium 400 MG TABS Take 800 mg by mouth 2 (two) times a day.     . Multiple Vitamin (MULTIVITAMIN) tablet Take 1 tablet by mouth 2 (two) times a day.    . ondansetron (ZOFRAN ODT) 4 MG disintegrating tablet Take 1 tablet (4 mg total) by mouth every 8 (eight) hours as needed for nausea or vomiting. 20 tablet 0  . oxyCODONE-acetaminophen (PERCOCET/ROXICET) 5-325 MG tablet Take 1 tablet by mouth every 6 (six) hours as needed for severe pain. 15 tablet 0  . RABEprazole (ACIPHEX) 20 MG tablet Take 20 mg by mouth daily.     . sertraline (ZOLOFT) 100 MG tablet Take 150 mg by mouth daily.     . TURMERIC PO Take 800 mg by mouth 2 (two) times a day.    . Vitamin A 7.5 MG (25000 UT) CAPS Take 25,000 Units by mouth daily.     Marland Kitchen VITAMIN D PO Take 50,000 Units by mouth every morning.      No current facility-administered medications on file prior to visit.  Cardiovascular and other pertinent studies:  EKG 12/15/2020: Sinus rhythm 85 bpm Normal EKG  Exercise treadmill stress test 03/31/2019:  Indication: Tachycardia  The patient exercised on Bruce protocol for  9:00 min. Patient achieved  10.16 METS and reached HR  171 bpm, which is   95% of maximum age-predicted HR.  Stress test terminated due to Fatigue.  Exercise capacity was normal. HR Response to Exercise: Appropriate. BP Response to Exercise: Normal resting BP- appropriate response. Chest Pain: none.Arrhythmias: none. Resting EKG demonstrates Normal sinus rhythm. ST Changes: With peak exercise there was no ST-T changes of ischemia.  Overall Impression: Normal stress  test. Recommendations: Continue primary/secondary prevention.   30 day event monitor 2020:  6 patient triggered events occurred with 1 episode of reported symptoms of shortness of breath, lightheaded on day 2 that correlated with NSR. No reported symptoms on other patient triggered events. Occasional sinus tachycardia with max heart rate of 158 bpm. No symptoms. No A fib or SVT was noted.   Recent labs: 03/05/2020: Glucose 58, BUN/Cr 16/0.78. EGFR >60. Na/K 142/4.2.  H/H 11.8/37.2. MCV 87. Platelets 210  Results for JAYNEE, WINTERS (MRN [id number]) as of 12/10/2020 16:08  Ref. Range 03/05/2020 21:31 03/05/2020 23:40 03/06/2020 04:10  Troponin I (High Sensitivity) Latest Ref Range: <18 ng/L 3 22 (H) 39 (H)   Results for ANDRYA, ROPPOLO (MRN [id number]) as of 12/15/2020 08:25  Ref. Range 03/05/2020 23:04  I-stat hCG, quantitative Latest Ref Range: <5 mIU/mL 25.9 (H)    Exercise treadmill stress test 03/31/2019:  Indication: Tachycardia  The patient exercised on Bruce protocol for  9:00 min. Patient achieved  10.16 METS and reached HR  171 bpm, which is   95% of maximum age-predicted HR.  Stress test terminated due to Fatigue.  Exercise capacity was normal. HR Response to Exercise: Appropriate. BP Response to Exercise: Normal resting BP- appropriate response. Chest Pain: none.Arrhythmias: none. Resting EKG demonstrates Normal sinus rhythm. ST Changes: With peak exercise there was no ST-T changes of ischemia.  Overall Impression: Normal stress test. Recommendations: Continue primary/secondary prevention.    Review of Systems  Cardiovascular: Negative for chest pain, dyspnea on exertion, leg swelling, palpitations and syncope.         Vitals:   12/15/20 0841  BP: 102/78  Pulse: 91  Resp: 16  Temp: 98.3 F (36.8 C)  SpO2: 98%     Body mass index is 30.54 kg/m. Filed Weights   12/15/20 0841  Weight: 195 lb (88.5 kg)     Objective:   Physical Exam Vitals  and nursing note reviewed.  Constitutional:      General: She is not in acute distress. Neck:     Vascular: No JVD.  Cardiovascular:     Rate and Rhythm: Normal rate and regular rhythm.     Heart sounds: Normal heart sounds. No murmur heard.   Pulmonary:     Effort: Pulmonary effort is normal.     Breath sounds: Normal breath sounds. No wheezing or rales.          Assessment & Recommendations:   45 y.o. African-American female with history of paroxysmal supraventricular tachycardia, here for preop risk stratification.  Preop risk stratification: Good functional capacity in spite of left hip pain.  No significant angina/angina equivalent symptoms.  Normal dyspnea EKG and physical exam.  Prior episode of troponin elevation in July 2021 likely supply demand mismatch in the setting of supraventricular tachycardia with HR upwards of 200 bpm.  Currently, she  does not have any significant palpitation symptoms.  Overall, her cardiac risk for hip surgery is low.  No further testing is necessary.  PSVT: Patient is well aware of vagal maneuvers. Given her infrequent symptoms, I do not recommend any other medical or ablation treatment at this time.  In future, if she has recurrence of PSVT, could consider antiarrhythmic therapy or ablation.  I will see her on as-needed basis.  Thank you for referring the patient to [place]. Please feel free to contact with any questions.   Nigel Mormon, MD Pager: [phone] Office: [phone]

## 2020-12-15 ENCOUNTER — Ambulatory Visit (INDEPENDENT_AMBULATORY_CARE_PROVIDER_SITE_OTHER): Payer: BC Managed Care – PPO | Admitting: Cardiology

## 2020-12-15 ENCOUNTER — Encounter: Payer: Self-pay | Admitting: Cardiology

## 2020-12-15 ENCOUNTER — Other Ambulatory Visit: Payer: Self-pay

## 2020-12-15 VITALS — BP 102/78 | HR 91 | Temp 98.3°F | Resp 16 | Ht 67.0 in | Wt 195.0 lb

## 2020-12-15 DIAGNOSIS — Z008 Encounter for other general examination: Secondary | ICD-10-CM | POA: Diagnosis not present

## 2020-12-15 DIAGNOSIS — E282 Polycystic ovarian syndrome: Secondary | ICD-10-CM | POA: Diagnosis not present

## 2020-12-15 DIAGNOSIS — Z01818 Encounter for other preprocedural examination: Secondary | ICD-10-CM | POA: Diagnosis not present

## 2020-12-15 DIAGNOSIS — E559 Vitamin D deficiency, unspecified: Secondary | ICD-10-CM | POA: Diagnosis not present

## 2020-12-15 DIAGNOSIS — E221 Hyperprolactinemia: Secondary | ICD-10-CM | POA: Diagnosis not present

## 2020-12-15 DIAGNOSIS — I471 Supraventricular tachycardia: Secondary | ICD-10-CM | POA: Diagnosis not present

## 2020-12-27 DIAGNOSIS — M25512 Pain in left shoulder: Secondary | ICD-10-CM | POA: Diagnosis not present

## 2020-12-27 DIAGNOSIS — M542 Cervicalgia: Secondary | ICD-10-CM | POA: Diagnosis not present

## 2020-12-27 DIAGNOSIS — M25552 Pain in left hip: Secondary | ICD-10-CM | POA: Diagnosis not present

## 2020-12-27 DIAGNOSIS — Z01812 Encounter for preprocedural laboratory examination: Secondary | ICD-10-CM | POA: Diagnosis not present

## 2020-12-27 DIAGNOSIS — M1612 Unilateral primary osteoarthritis, left hip: Secondary | ICD-10-CM | POA: Diagnosis not present

## 2020-12-28 DIAGNOSIS — F4323 Adjustment disorder with mixed anxiety and depressed mood: Secondary | ICD-10-CM | POA: Diagnosis not present

## 2021-01-06 DIAGNOSIS — R262 Difficulty in walking, not elsewhere classified: Secondary | ICD-10-CM | POA: Diagnosis not present

## 2021-01-06 DIAGNOSIS — M6281 Muscle weakness (generalized): Secondary | ICD-10-CM | POA: Diagnosis not present

## 2021-01-06 DIAGNOSIS — M25652 Stiffness of left hip, not elsewhere classified: Secondary | ICD-10-CM | POA: Diagnosis not present

## 2021-01-06 DIAGNOSIS — M1612 Unilateral primary osteoarthritis, left hip: Secondary | ICD-10-CM | POA: Diagnosis not present

## 2021-01-06 DIAGNOSIS — M25512 Pain in left shoulder: Secondary | ICD-10-CM | POA: Diagnosis not present

## 2021-01-06 DIAGNOSIS — M25552 Pain in left hip: Secondary | ICD-10-CM | POA: Diagnosis not present

## 2021-01-13 DIAGNOSIS — M1612 Unilateral primary osteoarthritis, left hip: Secondary | ICD-10-CM | POA: Diagnosis not present

## 2021-01-13 HISTORY — PX: JOINT REPLACEMENT: SHX530

## 2021-01-31 DIAGNOSIS — E236 Other disorders of pituitary gland: Secondary | ICD-10-CM | POA: Diagnosis not present

## 2021-02-22 DIAGNOSIS — Z3141 Encounter for fertility testing: Secondary | ICD-10-CM | POA: Diagnosis not present

## 2021-02-23 DIAGNOSIS — M170 Bilateral primary osteoarthritis of knee: Secondary | ICD-10-CM | POA: Diagnosis not present

## 2021-03-07 DIAGNOSIS — E288 Other ovarian dysfunction: Secondary | ICD-10-CM | POA: Diagnosis not present

## 2021-03-07 DIAGNOSIS — E236 Other disorders of pituitary gland: Secondary | ICD-10-CM | POA: Diagnosis not present

## 2021-04-22 DIAGNOSIS — N201 Calculus of ureter: Secondary | ICD-10-CM | POA: Diagnosis not present

## 2021-04-22 DIAGNOSIS — B951 Streptococcus, group B, as the cause of diseases classified elsewhere: Secondary | ICD-10-CM | POA: Diagnosis not present

## 2021-04-22 DIAGNOSIS — N390 Urinary tract infection, site not specified: Secondary | ICD-10-CM | POA: Diagnosis not present

## 2021-06-14 DIAGNOSIS — Z0000 Encounter for general adult medical examination without abnormal findings: Secondary | ICD-10-CM | POA: Diagnosis not present

## 2021-06-21 DIAGNOSIS — I10 Essential (primary) hypertension: Secondary | ICD-10-CM | POA: Diagnosis not present

## 2021-06-21 DIAGNOSIS — Z23 Encounter for immunization: Secondary | ICD-10-CM | POA: Diagnosis not present

## 2021-06-21 DIAGNOSIS — R7301 Impaired fasting glucose: Secondary | ICD-10-CM | POA: Diagnosis not present

## 2021-06-21 DIAGNOSIS — E221 Hyperprolactinemia: Secondary | ICD-10-CM | POA: Diagnosis not present

## 2021-06-21 DIAGNOSIS — Z0000 Encounter for general adult medical examination without abnormal findings: Secondary | ICD-10-CM | POA: Diagnosis not present

## 2021-08-21 NOTE — L&D Delivery Note (Addendum)
Delivery Note At 9:29 AM a non-viable female was delivered via Vaginal, Breech (Presentation: LSA).  APGAR: 0,0 ; weight 2 lb 8.6 oz (1151 g).  Breech extraction performed as per standard maneuvers. Fetus noted to be grossly intact.  Fetal tongue noted to be protruding. Fetus appears to have a flat nose. Hydrocephaly noted. No other abnormalities noted.   Placenta status: Spontaneous;Pathology, Intact.  Cord: 3 vessels with the following complications: None.  Anesthesia: Fentanyl PCA.   Episiotomy: None Lacerations: None Suture Repair:  N/A Est. Blood Loss (mL): 249  Mom to  [facility] .  Baby to [place]. Mother desires [facility] genetic testing, declines autopsy. They plan to cremate the fetus and arrangements will be made.  Archie Endo, MD.  06/20/2022, 10:31 AM.

## 2021-09-05 DIAGNOSIS — E288 Other ovarian dysfunction: Secondary | ICD-10-CM | POA: Diagnosis not present

## 2021-09-20 DIAGNOSIS — Z113 Encounter for screening for infections with a predominantly sexual mode of transmission: Secondary | ICD-10-CM | POA: Diagnosis not present

## 2021-09-20 DIAGNOSIS — Z1159 Encounter for screening for other viral diseases: Secondary | ICD-10-CM | POA: Diagnosis not present

## 2021-09-20 DIAGNOSIS — Z0183 Encounter for blood typing: Secondary | ICD-10-CM | POA: Diagnosis not present

## 2021-09-20 DIAGNOSIS — Z31430 Encounter of female for testing for genetic disease carrier status for procreative management: Secondary | ICD-10-CM | POA: Diagnosis not present

## 2021-10-10 DIAGNOSIS — N711 Chronic inflammatory disease of uterus: Secondary | ICD-10-CM | POA: Diagnosis not present

## 2021-10-10 DIAGNOSIS — N710 Acute inflammatory disease of uterus: Secondary | ICD-10-CM | POA: Diagnosis not present

## 2021-10-10 DIAGNOSIS — E288 Other ovarian dysfunction: Secondary | ICD-10-CM | POA: Diagnosis not present

## 2021-11-08 DIAGNOSIS — N859 Noninflammatory disorder of uterus, unspecified: Secondary | ICD-10-CM | POA: Diagnosis not present

## 2021-11-08 DIAGNOSIS — N711 Chronic inflammatory disease of uterus: Secondary | ICD-10-CM | POA: Diagnosis not present

## 2021-11-08 DIAGNOSIS — N710 Acute inflammatory disease of uterus: Secondary | ICD-10-CM | POA: Diagnosis not present

## 2021-11-08 DIAGNOSIS — Z3202 Encounter for pregnancy test, result negative: Secondary | ICD-10-CM | POA: Diagnosis not present

## 2021-11-13 ENCOUNTER — Encounter (HOSPITAL_BASED_OUTPATIENT_CLINIC_OR_DEPARTMENT_OTHER): Payer: Self-pay | Admitting: Emergency Medicine

## 2021-11-13 ENCOUNTER — Other Ambulatory Visit: Payer: Self-pay

## 2021-11-13 ENCOUNTER — Emergency Department (HOSPITAL_BASED_OUTPATIENT_CLINIC_OR_DEPARTMENT_OTHER)
Admission: EM | Admit: 2021-11-13 | Discharge: 2021-11-13 | Disposition: A | Discharge status: Home / Self Care | Payer: BC Managed Care – PPO | Attending: Emergency Medicine | Admitting: Emergency Medicine

## 2021-11-13 ENCOUNTER — Emergency Department (HOSPITAL_BASED_OUTPATIENT_CLINIC_OR_DEPARTMENT_OTHER): Payer: BC Managed Care – PPO | Admitting: Radiology

## 2021-11-13 DIAGNOSIS — S61111A Laceration without foreign body of right thumb with damage to nail, initial encounter: Secondary | ICD-10-CM | POA: Insufficient documentation

## 2021-11-13 DIAGNOSIS — W230XXA Caught, crushed, jammed, or pinched between moving objects, initial encounter: Secondary | ICD-10-CM | POA: Insufficient documentation

## 2021-11-13 DIAGNOSIS — S62521A Displaced fracture of distal phalanx of right thumb, initial encounter for closed fracture: Secondary | ICD-10-CM | POA: Diagnosis not present

## 2021-11-13 DIAGNOSIS — S62524B Nondisplaced fracture of distal phalanx of right thumb, initial encounter for open fracture: Secondary | ICD-10-CM | POA: Diagnosis not present

## 2021-11-13 DIAGNOSIS — Z23 Encounter for immunization: Secondary | ICD-10-CM | POA: Diagnosis not present

## 2021-11-13 DIAGNOSIS — S6991XA Unspecified injury of right wrist, hand and finger(s), initial encounter: Secondary | ICD-10-CM | POA: Diagnosis not present

## 2021-11-13 DIAGNOSIS — S61319A Laceration without foreign body of unspecified finger with damage to nail, initial encounter: Secondary | ICD-10-CM

## 2021-11-13 MED ORDER — TETANUS-DIPHTH-ACELL PERTUSSIS 5-2.5-18.5 LF-MCG/0.5 IM SUSY
0.5000 mL | PREFILLED_SYRINGE | Freq: Once | INTRAMUSCULAR | Status: AC
  Administered 2021-11-13: 0.5 mL via INTRAMUSCULAR
  Filled 2021-11-13: qty 0.5

## 2021-11-13 MED ORDER — BUPIVACAINE HCL 0.5 % IJ SOLN
50.0000 mL | Freq: Once | INTRAMUSCULAR | Status: AC
  Administered 2021-11-13: 50 mL
  Filled 2021-11-13: qty 1

## 2021-11-13 MED ORDER — HYDROCODONE-ACETAMINOPHEN 5-325 MG PO TABS
2.0000 | ORAL_TABLET | Freq: Once | ORAL | Status: AC
  Administered 2021-11-13: 2 via ORAL
  Filled 2021-11-13: qty 2

## 2021-11-13 MED ORDER — SULFAMETHOXAZOLE-TRIMETHOPRIM 800-160 MG PO TABS: 1.0000 | ORAL_TABLET | Freq: Two times a day (BID) | ORAL | 0 refills | Status: DC

## 2021-11-13 MED ORDER — OXYCODONE-ACETAMINOPHEN 5-325 MG PO TABS
1.0000 | ORAL_TABLET | ORAL | 0 refills | Status: DC | PRN
Start: 2021-11-13 — End: 2021-11-13

## 2021-11-13 MED ORDER — OXYCODONE-ACETAMINOPHEN 5-325 MG PO TABS: 1.0000 | ORAL_TABLET | ORAL | 0 refills | Status: DC | PRN

## 2021-11-13 NOTE — Discharge Instructions (Signed)
Leave yellow xeroform dressing in place until rechecked by Orthopaedist.  Return if any sign of infection  ?

## 2021-11-13 NOTE — ED Triage Notes (Signed)
Pt via pov from home after slamming her thumb in the car door about 40 minutes PTA. Pt has blood all over her right thumb, which appears to be coagulating; she states that the nail is separated from the finger. Pt alert & oriented, nad noted.  ?

## 2021-11-14 DIAGNOSIS — M1811 Unilateral primary osteoarthritis of first carpometacarpal joint, right hand: Secondary | ICD-10-CM | POA: Diagnosis not present

## 2021-11-17 NOTE — ED Provider Notes (Signed)
?[facility]ERG[facility] DEPT ?Provider Note ? ? ?CSN: [id number] ?Arrival date & time: 11/13/21  1541 ? ?  ? ?History ? ?No chief complaint on file. ? ? ?Sarah Saunders is a 46 y.o. female. ? ?Pt complains of crushing her thumb in a car door.  Pt complains of pain.  Pt reports part of nail pulled off  ? ?The history is provided by the patient. No language interpreter was used.  ?Hand Pain ?This is a new problem. The current episode started 1 to 2 hours ago. The problem occurs constantly. The problem has not changed since onset.Nothing aggravates the symptoms. Nothing relieves the symptoms. She has tried nothing for the symptoms. The treatment provided no relief.  ? ?  ? ?Home Medications ?Prior to Admission medications   ?Medication Sig Start Date End Date Taking? Authorizing Provider  ?albuterol (PROVENTIL HFA;VENTOLIN HFA) 108 (90 BASE) MCG/ACT inhaler Inhale 2 puffs into the lungs every 4 (four) hours as needed for wheezing or shortness of breath.    [provider]  ?bromocriptine (PARLODEL) 2.5 MG tablet 2 tablets daily.    [provider]  ?Calcium Citrate-Vitamin D (CALCIUM CITRATE +D PO) Take 1 capsule by mouth in the morning, at noon, in the evening, and at bedtime. 4 times a day    [provider]  ?diazepam (VALIUM) 5 MG tablet Take 1 tablet (5 mg total) by mouth every 8 (eight) hours as needed for muscle spasms. 02/23/18   Long, Wonda Olds, MD  ?diclofenac (VOLTAREN) 50 MG EC tablet Take 50 mg by mouth 2 (two) times daily. 10/07/20   [provider]  ?diltiazem (CARDIZEM) 30 MG tablet Take 1 tablet (30 mg total) by mouth 3 (three) times daily as needed (for palpitations). 06/30/19   Miquel Dunn, NP  ?folic acid (FOLVITE) 767 MCG tablet Take 800 mcg by mouth 2 (two) times daily.    [provider]  ?Glucosamine Sulfate 1000 MG CAPS 2 (two) times daily.     [provider]  ?Inositol-Folic Acid (PREGNITUDE PO) Take 1 packet by mouth 2  (two) times a day. Mix in 4oz of water or juice    [provider]  ?iron polysaccharides (NIFEREX) 150 MG capsule Take 150 mg by mouth 2 (two) times daily.    [provider]  ?Magnesium 400 MG TABS Take 800 mg by mouth 2 (two) times a day.     [provider]  ?Multiple Vitamin (MULTIVITAMIN) tablet Take 1 tablet by mouth 2 (two) times a day.    [provider]  ?oxyCODONE-acetaminophen (PERCOCET) 5-325 MG tablet Take 1 tablet by mouth every 4 (four) hours as needed for severe pain. 11/13/21 11/13/22  Fransico Meadow, PA-C  ?promethazine (PHENERGAN) 25 MG tablet Take 25-50 mg by mouth every 8 (eight) hours as needed. 12/04/20   [provider]  ?RABEprazole (ACIPHEX) 20 MG tablet Take 20 mg by mouth daily.     [provider]  ?sertraline (ZOLOFT) 100 MG tablet Take 150 mg by mouth daily.     [provider]  ?sulfamethoxazole-trimethoprim (BACTRIM DS) 800-160 MG tablet Take 1 tablet by mouth 2 (two) times daily. 11/13/21   Fransico Meadow, PA-C  ?tamsulosin (FLOMAX) 0.4 MG CAPS capsule Take 0.4 mg by mouth.    [provider]  ?traZODone (DESYREL) 50 MG tablet as needed. 08/23/20   [provider]  ?TURMERIC PO Take 800 mg by mouth 2 (two) times a day.  [provider]  ?Ubrogepant (UBRELVY) 100 MG TABS as needed. 08/04/20   [provider]  ?Vitamin A 7.5 MG (25000 UT) CAPS Take 25,000 Units by mouth daily.     [provider]  ?VITAMIN D PO Take 50,000 Units by mouth every morning.     [provider]  ?   ? ?Allergies    ?Butorphanol   ? ?Review of Systems   ?Review of Systems  ?Musculoskeletal:  Positive for joint swelling.  ?Skin:  Positive for wound.  ?All other systems reviewed and are negative. ? ?Physical Exam ?Updated Vital Signs ?BP (!) 145/89 (BP Location: Right Arm)   Pulse (!) 55   Temp 98.5 ?F (36.9 ?C)   Resp 20   Ht 5' 7.5" (1.715 m)   Wt 93 kg   LMP 09/14/2021 (Approximate)    SpO2 98%   BMI 31.63 kg/m?  ?Physical Exam ?Vitals reviewed.  ?Constitutional:   ?   Appearance: Normal appearance.  ?Cardiovascular:  ?   Rate and Rhythm: Normal rate.  ?Pulmonary:  ?   Effort: Pulmonary effort is normal.  ?Musculoskeletal:     ?   General: Swelling, tenderness and deformity present.  ?   Cervical back: Normal range of motion.  ?   Comments: Nail partially avulsed, laceration to nailbed,  from with pain nv and ns intact   ?Neurological:  ?   General: No focal deficit present.  ?   Mental Status: She is alert.  ?Psychiatric:     ?   Mood and Affect: Mood normal.  ? ? ?ED Results / Procedures / Treatments   ?Labs ?(all labs ordered are listed, but only abnormal results are displayed) ?Labs Reviewed - No data to display ? ?EKG ?None ? ?Radiology ?No results found. ? ?Procedures ?Marland Kitchen.Laceration Repair ? ?Date/Time: 11/17/2021 11:40 AM ?Performed by: Fransico Meadow, PA-C ?Authorized by: Fransico Meadow, PA-C  ? ?Consent:  ?  Consent obtained:  Verbal ?  Consent given by:  Patient ?  Risks, benefits, and alternatives were discussed: yes   ?  Risks discussed:  Infection, pain and poor wound healing ?  Alternatives discussed:  Delayed treatment ?Universal protocol:  ?  Procedure explained and questions answered to patient or proxy's satisfaction: yes   ?  Immediately prior to procedure, a time out was called: yes   ?  Patient identity confirmed:  Verbally with patient ?Anesthesia:  ?  Anesthesia method:  Nerve block ?  Block needle gauge:  27 G ?  Block anesthetic:  Bupivacaine 0.25% w/o epi ?  Block outcome:  Incomplete block ?Laceration details:  ?  Location:  Finger ?  Finger location:  R thumb ?  Length (cm):  1.2 ?Pre-procedure details:  ?  Preparation:  Patient was prepped and draped in usual sterile fashion and imaging obtained to evaluate for foreign bodies ?Exploration:  ?  Wound exploration: wound explored through full range of motion   ?  Contaminated: no   ?Treatment:  ?  Area cleansed with:   Povidone-iodine ?  Amount of cleaning:  Standard ?  Irrigation solution:  Sterile saline ?  Debridement:  Moderate (distal portion of nail removed/trimmed) ?  Undermining:  None ?Skin repair:  ?  Repair method:  Sutures ?  Suture size:  5-0 ?  Suture material:  Chromic gut ?  Suture technique:  Simple interrupted ?  Number of sutures:  5 ?Approximation:  ?  Approximation:  Close ?Repair type:  ?  Repair type:  Complex ?Post-procedure details:  ?  Procedure completion:  Tolerated ?Comments:  ?   Nail bed repaired,    ? ? ?Medications Ordered in ED ?Medications  ?bupivacaine (MARCAINE) 0.5 % (with pres) injection 50 mL (50 mLs Infiltration Given 11/13/21 1911)  ?Tdap (BOOSTRIX) injection 0.5 mL (0.5 mLs Intramuscular Given 11/13/21 1909)  ?HYDROcodone-acetaminophen (NORCO/VICODIN) 5-325 MG per tablet 2 tablet (2 tablets Oral Given 11/13/21 1911)  ? ? ?ED Course/ Medical Decision Making/ A&P ?  ?                        ?Medical Decision Making ?Amount and/or Complexity of Data Reviewed ?Radiology: ordered. ? ?Risk ?Prescription drug management. ? ? ?MDM: Patient counseled on nailbed lacerations and open fractures.  Patient given prescription for antibiotics and pain medicine she is advised to follow-up with hand surgery.  Patient is advised of scarring to nail bed. ? ? ? ? ? ? ? ?Final Clinical Impression(s) / ED Diagnoses ?Final diagnoses:  ?Open nondisplaced fracture of distal phalanx of right thumb, initial encounter  ?Laceration of finger nail bed, initial encounter  ? ? ?Rx / DC Orders ?An After Visit Summary was printed and given to the patient.  ? ?  ?Fransico Meadow, [place] ?11/17/21 1145 ? ?  ?Lianne Cure, DO ?59/93/57 0177 ? ?

## 2021-11-23 DIAGNOSIS — M1811 Unilateral primary osteoarthritis of first carpometacarpal joint, right hand: Secondary | ICD-10-CM | POA: Diagnosis not present

## 2021-11-25 DIAGNOSIS — J3089 Other allergic rhinitis: Secondary | ICD-10-CM | POA: Diagnosis not present

## 2021-11-25 DIAGNOSIS — K219 Gastro-esophageal reflux disease without esophagitis: Secondary | ICD-10-CM | POA: Diagnosis not present

## 2021-11-25 DIAGNOSIS — G43909 Migraine, unspecified, not intractable, without status migrainosus: Secondary | ICD-10-CM | POA: Diagnosis not present

## 2021-12-09 DIAGNOSIS — M1811 Unilateral primary osteoarthritis of first carpometacarpal joint, right hand: Secondary | ICD-10-CM | POA: Diagnosis not present

## 2021-12-16 DIAGNOSIS — N978 Female infertility of other origin: Secondary | ICD-10-CM | POA: Diagnosis not present

## 2021-12-16 DIAGNOSIS — Z3183 Encounter for assisted reproductive fertility procedure cycle: Secondary | ICD-10-CM | POA: Diagnosis not present

## 2021-12-22 DIAGNOSIS — N978 Female infertility of other origin: Secondary | ICD-10-CM | POA: Diagnosis not present

## 2021-12-22 DIAGNOSIS — Z3183 Encounter for assisted reproductive fertility procedure cycle: Secondary | ICD-10-CM | POA: Diagnosis not present

## 2021-12-29 DIAGNOSIS — Z3183 Encounter for assisted reproductive fertility procedure cycle: Secondary | ICD-10-CM | POA: Diagnosis not present

## 2021-12-29 DIAGNOSIS — N978 Female infertility of other origin: Secondary | ICD-10-CM | POA: Diagnosis not present

## 2021-12-30 DIAGNOSIS — N978 Female infertility of other origin: Secondary | ICD-10-CM | POA: Diagnosis not present

## 2022-01-04 DIAGNOSIS — N978 Female infertility of other origin: Secondary | ICD-10-CM | POA: Diagnosis not present

## 2022-01-13 ENCOUNTER — Other Ambulatory Visit: Payer: Self-pay

## 2022-01-13 ENCOUNTER — Encounter (HOSPITAL_COMMUNITY): Payer: Self-pay

## 2022-01-13 ENCOUNTER — Emergency Department (HOSPITAL_COMMUNITY)
Admission: EM | Admit: 2022-01-13 | Discharge: 2022-01-13 | Disposition: A | Discharge status: Home / Self Care | Payer: BC Managed Care – PPO | Attending: Emergency Medicine | Admitting: Emergency Medicine

## 2022-01-13 DIAGNOSIS — I499 Cardiac arrhythmia, unspecified: Secondary | ICD-10-CM | POA: Diagnosis not present

## 2022-01-13 DIAGNOSIS — Z3A01 Less than 8 weeks gestation of pregnancy: Secondary | ICD-10-CM

## 2022-01-13 DIAGNOSIS — I471 Supraventricular tachycardia: Secondary | ICD-10-CM | POA: Diagnosis not present

## 2022-01-13 DIAGNOSIS — R079 Chest pain, unspecified: Secondary | ICD-10-CM | POA: Insufficient documentation

## 2022-01-13 DIAGNOSIS — R0902 Hypoxemia: Secondary | ICD-10-CM | POA: Diagnosis not present

## 2022-01-13 DIAGNOSIS — R002 Palpitations: Secondary | ICD-10-CM | POA: Diagnosis not present

## 2022-01-13 LAB — CBC WITH DIFFERENTIAL/PLATELET
Abs Immature Granulocytes: 0.03 10*3/uL (ref 0.00–0.07)
Basophils Absolute: 0.1 10*3/uL (ref 0.0–0.1)
Basophils Relative: 1 %
Eosinophils Absolute: 0.1 10*3/uL (ref 0.0–0.5)
Eosinophils Relative: 1 %
HCT: 34 % — ABNORMAL LOW (ref 36.0–46.0)
Hemoglobin: 10.7 g/dL — ABNORMAL LOW (ref 12.0–15.0)
Immature Granulocytes: 0 %
Lymphocytes Relative: 29 %
Lymphs Abs: 2.4 10*3/uL (ref 0.7–4.0)
MCH: 26 pg (ref 26.0–34.0)
MCHC: 31.5 g/dL (ref 30.0–36.0)
MCV: 82.7 fL (ref 80.0–100.0)
Monocytes Absolute: 0.7 10*3/uL (ref 0.1–1.0)
Monocytes Relative: 9 %
Neutro Abs: 5 10*3/uL (ref 1.7–7.7)
Neutrophils Relative %: 60 %
Platelets: 253 10*3/uL (ref 150–400)
RBC: 4.11 MIL/uL (ref 3.87–5.11)
RDW: 16.9 % — ABNORMAL HIGH (ref 11.5–15.5)
WBC: 8.2 10*3/uL (ref 4.0–10.5)
nRBC: 0 % (ref 0.0–0.2)

## 2022-01-13 LAB — I-STAT CHEM 8, ED
BUN: 15 mg/dL (ref 6–20)
Calcium, Ion: 1.23 mmol/L (ref 1.15–1.40)
Chloride: 112 mmol/L — ABNORMAL HIGH (ref 98–111)
Creatinine, Ser: 0.8 mg/dL (ref 0.44–1.00)
Glucose, Bld: 65 mg/dL — ABNORMAL LOW (ref 70–99)
HCT: 38 % (ref 36.0–46.0)
Hemoglobin: 12.9 g/dL (ref 12.0–15.0)
Potassium: 3.7 mmol/L (ref 3.5–5.1)
Sodium: 140 mmol/L (ref 135–145)
TCO2: 19 mmol/L — ABNORMAL LOW (ref 22–32)

## 2022-01-13 LAB — I-STAT BETA HCG BLOOD, ED (MC, WL, AP ONLY): I-stat hCG, quantitative: 166.3 m[IU]/mL — ABNORMAL HIGH (ref <5)

## 2022-01-13 LAB — TROPONIN I (HIGH SENSITIVITY)
Troponin I (High Sensitivity): 9 ng/L (ref <18)
Troponin I (High Sensitivity): 31 ng/L — ABNORMAL HIGH (ref <18)

## 2022-01-13 LAB — CBG MONITORING, ED
Glucose-Capillary: 75 mg/dL (ref 70–99)
Glucose-Capillary: 89 mg/dL (ref 70–99)

## 2022-01-13 MED ORDER — SODIUM CHLORIDE 0.9 % IV BOLUS
500.0000 mL | Freq: Once | INTRAVENOUS | Status: AC
  Administered 2022-01-13: 500 mL via INTRAVENOUS

## 2022-01-13 MED ORDER — METOPROLOL TARTRATE 25 MG PO TABS: 25.0000 mg | ORAL_TABLET | Freq: Two times a day (BID) | ORAL | 0 refills | Status: DC | PRN

## 2022-01-13 NOTE — ED Notes (Signed)
Ed provider at bedside

## 2022-01-13 NOTE — ED Notes (Signed)
Pt provided apple juice and graham crackers with peanut butter at this time per providers request

## 2022-01-13 NOTE — ED Provider Notes (Addendum)
[facility] EMERGENCY DEPARTMENT Provider Note   CSN: [id number] Arrival date & time: 01/13/22  4098     History  Chief Complaint  Patient presents with   Chest Pain    Sarah Saunders is a 46 y.o. female.  The history is provided by the patient and the EMS personnel.  Palpitations Palpitations quality:  Fast Onset quality:  Sudden Duration:  18 minutes Timing:  Constant Progression:  Resolved Chronicity:  Recurrent Context comment:  Sitting on couch relaxing Relieved by:  Nothing Worsened by:  Nothing Ineffective treatments:  None tried Associated symptoms: no back pain, no nausea, no shortness of breath, no vomiting and no weakness   Risk factors: no diabetes mellitus   Patient who has a h/o same but never had SVT caught on monitor had palpitations on couch.  May be pregnant as had an embryo transfer.      Home Medications Prior to Admission medications   Medication Sig Start Date End Date Taking? Authorizing Provider  albuterol (PROVENTIL HFA;VENTOLIN HFA) 108 (90 BASE) MCG/ACT inhaler Inhale 2 puffs into the lungs every 4 (four) hours as needed for wheezing or shortness of breath.    [provider]  bromocriptine (PARLODEL) 2.5 MG tablet 2 tablets daily.    [provider]  Calcium Citrate-Vitamin D (CALCIUM CITRATE +D PO) Take 1 capsule by mouth in the morning, at noon, in the evening, and at bedtime. 4 times a day    [provider]  diazepam (VALIUM) 5 MG tablet Take 1 tablet (5 mg total) by mouth every 8 (eight) hours as needed for muscle spasms. 02/23/18   Long, Wonda Olds, MD  diclofenac (VOLTAREN) 50 MG EC tablet Take 50 mg by mouth 2 (two) times daily. 10/07/20   [provider]  diltiazem (CARDIZEM) 30 MG tablet Take 1 tablet (30 mg total) by mouth 3 (three) times daily as needed (for palpitations). 06/30/19   Miquel Dunn, NP  folic acid (FOLVITE) 119 MCG tablet Take 800 mcg by mouth 2 (two) times  daily.    [provider]  Glucosamine Sulfate 1000 MG CAPS 2 (two) times daily.     [provider]  Inositol-Folic Acid (PREGNITUDE PO) Take 1 packet by mouth 2 (two) times a day. Mix in 4oz of water or juice    [provider]  iron polysaccharides (NIFEREX) 150 MG capsule Take 150 mg by mouth 2 (two) times daily.    [provider]  Magnesium 400 MG TABS Take 800 mg by mouth 2 (two) times a day.     [provider]  Multiple Vitamin (MULTIVITAMIN) tablet Take 1 tablet by mouth 2 (two) times a day.    [provider]  oxyCODONE-acetaminophen (PERCOCET) 5-325 MG tablet Take 1 tablet by mouth every 4 (four) hours as needed for severe pain. 11/13/21 11/13/22  Fransico Meadow, PA-C  promethazine (PHENERGAN) 25 MG tablet Take 25-50 mg by mouth every 8 (eight) hours as needed for vomiting or nausea. 12/04/20   [provider]  RABEprazole (ACIPHEX) 20 MG tablet Take 20 mg by mouth daily.     [provider]  sertraline (ZOLOFT) 100 MG tablet Take 150 mg by mouth daily.     [provider]  sulfamethoxazole-trimethoprim (BACTRIM DS) 800-160 MG tablet Take 1 tablet by mouth 2 (two) times daily. 11/13/21   Fransico Meadow, PA-C  tamsulosin (FLOMAX) 0.4 MG CAPS capsule Take 0.4 mg by mouth daily.  [provider]  traZODone (DESYREL) 50 MG tablet Take 50 mg by mouth at bedtime as needed for sleep. 08/23/20   [provider]  TURMERIC PO Take 800 mg by mouth 2 (two) times a day.    [provider]  Ubrogepant (UBRELVY) 100 MG TABS Take 100 mg by mouth 2 (two) times daily as needed (migraine). 08/04/20   [provider]  Vitamin A 7.5 MG (25000 UT) CAPS Take 25,000 Units by mouth daily.     [provider]  VITAMIN D PO Take 50,000 Units by mouth every morning.     [provider]      Allergies    Butorphanol    Review of Systems   Review of Systems  Constitutional:   Negative for fever.  HENT:  Negative for facial swelling.   Eyes:  Negative for photophobia.  Respiratory:  Negative for shortness of breath, wheezing and stridor.   Cardiovascular:  Positive for palpitations.  Gastrointestinal:  Negative for nausea and vomiting.  Musculoskeletal:  Negative for back pain.  Neurological:  Negative for facial asymmetry and weakness.  All other systems reviewed and are negative.  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  Constitutional:      General: She is not in acute distress.    Appearance: Normal appearance.  HENT:     Head: Normocephalic and atraumatic.     Nose: Nose normal.  Eyes:     Conjunctiva/sclera: Conjunctivae normal.     Pupils: Pupils are equal, round, and reactive to light.  Cardiovascular:     Rate and Rhythm: Normal rate and regular rhythm.     Pulses: Normal pulses.     Heart sounds: Normal heart sounds.  Pulmonary:     Effort: Pulmonary effort is normal.     Breath sounds: Normal breath sounds.  Abdominal:     General: Abdomen is flat. Bowel sounds are normal.     Palpations: Abdomen is soft.     Tenderness: There is no abdominal tenderness. There is no guarding.  Musculoskeletal:        General: Normal range of motion.     Cervical back: Normal range of motion and neck supple.  Skin:    General: Skin is warm and dry.     Capillary Refill: Capillary refill takes less than 2 seconds.  Neurological:     General: No focal deficit present.     Mental Status: She is alert and oriented to person, place, and time.     Deep Tendon Reflexes: Reflexes normal.  Psychiatric:        Mood and Affect: Mood normal.        Behavior: Behavior normal.    ED Results /  Procedures / Treatments   Labs (all labs ordered are listed, but only abnormal results are displayed) Results for orders placed or performed during the hospital encounter of 01/13/22  CBC with Differential  Result Value Ref Range   WBC 8.2 4.0 - 10.5 K/uL   RBC 4.11 3.87 - 5.11 MIL/uL   Hemoglobin 10.7 (L) 12.0 - 15.0 g/dL   HCT 34.0 (L) 36.0 -  46.0 %   MCV 82.7 80.0 - 100.0 fL   MCH 26.0 26.0 - 34.0 pg   MCHC 31.5 30.0 - 36.0 g/dL   RDW 16.9 (H) 11.5 - 15.5 %   Platelets 253 150 - 400 K/uL   nRBC 0.0 0.0 - 0.2 %   Neutrophils Relative % 60 %   Neutro Abs 5.0 1.7 - 7.7 K/uL   Lymphocytes Relative 29 %   Lymphs Abs 2.4 0.7 - 4.0 K/uL   Monocytes Relative 9 %   Monocytes Absolute 0.7 0.1 - 1.0 K/uL   Eosinophils Relative 1 %   Eosinophils Absolute 0.1 0.0 - 0.5 K/uL   Basophils Relative 1 %   Basophils Absolute 0.1 0.0 - 0.1 K/uL   Immature Granulocytes 0 %   Abs Immature Granulocytes 0.03 0.00 - 0.07 K/uL  I-Stat Beta hCG blood, ED ([facility], [facility], [facility] only)  Result Value Ref Range   I-stat hCG, quantitative 166.3 (H) <5 mIU/mL   Comment 3          I-stat chem 8, ED (not at [facility] or [facility])  Result Value Ref Range   Sodium 140 135 - 145 mmol/L   Potassium 3.7 3.5 - 5.1 mmol/L   Chloride 112 (H) 98 - 111 mmol/L   BUN 15 6 - 20 mg/dL   Creatinine, Ser 0.80 0.44 - 1.00 mg/dL   Glucose, Bld 65 (L) 70 - 99 mg/dL   Calcium, Ion 1.23 1.15 - 1.40 mmol/L   TCO2 19 (L) 22 - 32 mmol/L   Hemoglobin 12.9 12.0 - 15.0 g/dL   HCT 38.0 36.0 - 46.0 %  POC CBG, ED  Result Value Ref Range   Glucose-Capillary 75 70 - 99 mg/dL  POC CBG, ED  Result Value Ref Range   Glucose-Capillary 89 70 - 99 mg/dL  Troponin I (High Sensitivity)  Result Value Ref Range   Troponin I (High Sensitivity) 9 <18 ng/L   No results found.   EKG EKG Interpretation  Date/Time:  Friday Jan 13 2022 03:18:31 EDT Ventricular Rate:  103 PR Interval:  146 QRS Duration: 88 QT Interval:  331 QTC Calculation: 434 R  Axis:   60 Text Interpretation: Sinus tachycardia Confirmed by Dory Horn) on 01/13/2022 3:20:31 AM  Radiology No results found.  Procedures Procedures    Medications Ordered in ED Medications  sodium chloride 0.9 % bolus 500 mL (500 mLs Intravenous New Bag/Given 01/13/22 7829)    ED Course/ Medical Decision Making/ A&P                           Medical Decision Making SVT on multiple occasions never caught on monitor.  Adenosine given with cessation.  No further symptoms   Amount and/or Complexity of Data Reviewed Independent Historian: EMS    Details: see above External Data Reviewed: notes.    Details: previous notes reviewed Labs: ordered.    Details: all labs reviewed: negative troponins. Normal white 8.2 hemoglobin 10.7 platelets 253K. sodium 140, potassium 3.7, BUN 15, creatinine .80 glucose was low 65. ECG/medicine tests: ordered and independent interpretation performed. Decision-making details documented in ED Course. Discussion of management or test interpretation with external provider(s): Case d/w Dr. Karle Starch.  Metoprolol 25 mg prn SVT and call the office.    Risk Prescription drug management. Risk Details: Troponin are consistent with previous in [facility] system, this appears to be her baseline.  She is not having chest pain or  shortness of breath.  No nausea vomiting or diaphoresis.  I do not believe this is ACS or an ACS variant.  Patient advised to call her OB, cardiologist and bariatric surgeon today as you are pregnant with SVT and low blood sugars.  Strict return precautions given.      Final Clinical Impression(s) / ED Diagnoses Final diagnoses:  None   Return for intractable cough, coughing up blood, fevers > 100.4 unrelieved by medication, shortness of breath, intractable vomiting, chest pain, shortness of breath, weakness, numbness, changes in speech, facial asymmetry, abdominal pain, passing out, Inability to tolerate liquids or food, cough,  altered mental status or any concerns. No signs of systemic illness or infection. The patient is nontoxic-appearing on exam and vital signs are within normal limits.  I have reviewed the triage vital signs and the nursing notes. Pertinent labs & imaging results that were available during my care of the patient were reviewed by me and considered in my medical decision making (see chart for details). After history, exam, and medical workup I feel the patient has been appropriately medically screened and is safe for discharge home. Pertinent diagnoses were discussed with the patient. Patient was given return precautions.     Malynn Lucy, MD 01/13/22 [id number]

## 2022-01-13 NOTE — ED Triage Notes (Signed)
BIB [facility] from home c/o pulse ox reading a high heart rate and felt like heart was fluttering. Rate with EMS was a narrow complex tach at rate of 220 svt. PIV 18ga LT AC, Adenisone '6mg'$   pt converted

## 2022-01-13 NOTE — ED Notes (Addendum)
Wrong pt

## 2022-01-13 NOTE — Progress Notes (Signed)
ON-CALL CARDIOLOGY 01/13/22  Patient's name: STARLEE CORRALEJO.   MRN: [id number].    DOB: [birth year] Primary care provider: Merrilee Seashore, MD. Referring physician: Dr. Randal Buba Primary cardiologist: Dr. Vernell Leep  Interaction regarding this patient's care today: Was called by the ER physician to discuss Ms. Lateefah T Ripberger's case.  Patient was resting at home and started experiencing palpitations and no significant improvement with vagal maneuvers.  She called EMS and patient was given 6 mg of adenosine and the supraventricular tachycardia as noted on telemetry broke she was in normal sinus rhythm by the time she arrived to the ED.  Patient denies anginal discomfort or heart failure symptoms.  She recently had an embryo transfer on Jan 04, 2022.  Impression: Supraventricular tachycardia. Elevated troponins likely secondary to tachycardia  Recommendations: EKG reviewed which illustrates normal sinus rhythm without underlying injury pattern.  Her high sensitive troponins likely suggestive of supply demand ischemia in the setting of SVT and not ACS.  Discussed the importance of vagal maneuvers for reoccurrence of symptoms.  If palpitations are frequent may consider Lopressor 25 mg p.o. twice daily on as needed basis.  Beta-blockers are considered to be relatively safe during pregnancy but during the first trimester caution advised.  I will have the office reach out to the patient to follow-up with Dr. Virgina Jock.  She is also advised to call the office by midday today if she does not receive a phone call.  Telephone encounter total time: 18 minutes.  Rex Kras, [place], [facility]  Pager: [phone] Office: [phone]

## 2022-01-19 NOTE — Progress Notes (Unsigned)
Patient referred by Merrilee Seashore, MD for pre-op risk stratification  Subjective:   Sarah Saunders, female    DOB: [birth year], 46 y.o.   MRN: [id number]   No chief complaint on file.    HPI  46 y.o. African-American female with history of paroxysmal supraventricular tachycardia, here for preop risk stratification.  Patient was seen in [facility] ER with complaitns of palpitations and was found ot be in SVT, although no EKG or telemetry is available for my review. She recently underwent embryo transfer***   Current Outpatient Medications:    albuterol (PROVENTIL HFA;VENTOLIN HFA) 108 (90 BASE) MCG/ACT inhaler, Inhale 2 puffs into the lungs every 4 (four) hours as needed for wheezing or shortness of breath., Disp: , Rfl:    aspirin EC 81 MG tablet, Take 81 mg by mouth daily. Swallow whole., Disp: , Rfl:    Azelastine HCl 137 MCG/SPRAY SOLN, Place 1 spray into both nostrils daily., Disp: , Rfl:    bromocriptine (PARLODEL) 2.5 MG tablet, Take 5 mg by mouth daily., Disp: , Rfl:    Calcium Citrate-Vitamin D (CALCIUM CITRATE +D PO), Take 1 capsule by mouth in the morning, at noon, in the evening, and at bedtime. 4 times a day, Disp: , Rfl:    diazepam (VALIUM) 5 MG tablet, Take 1 tablet (5 mg total) by mouth every 8 (eight) hours as needed for muscle spasms., Disp: 10 tablet, Rfl: 0   diclofenac (VOLTAREN) 75 MG EC tablet, Take 75 mg by mouth daily., Disp: , Rfl:    diltiazem (CARDIZEM) 30 MG tablet, Take 1 tablet (30 mg total) by mouth 3 (three) times daily as needed (for palpitations)., Disp: 30 tablet, Rfl: 3   estradiol (ESTRING) 2 MG vaginal ring, Place 2 mg vaginally every 3 (three) months. follow package directions, Disp: , Rfl:    Estradiol Valerate 10 MG/ML OIL, Inject 0.43 mLs into the muscle 2 (two) times a week. Monday,friday, Disp: , Rfl:    Fe Fum-Fe Poly-Vit C-Lactobac (FUSION PO), Take 1 capsule by mouth daily., Disp: , Rfl:    ipratropium (ATROVENT) 0.03 % nasal  spray, Place 1 spray into both nostrils in the morning and at bedtime., Disp: , Rfl:    metoprolol tartrate (LOPRESSOR) 25 MG tablet, Take 1 tablet (25 mg total) by mouth every 12 (twelve) hours as needed (PRN SVT)., Disp: 10 tablet, Rfl: 0   oxyCODONE-acetaminophen (PERCOCET) 5-325 MG tablet, Take 1 tablet by mouth every 4 (four) hours as needed for severe pain. (Patient not taking: Reported on 01/13/2022), Disp: 12 tablet, Rfl: 0   Prenatal Vit-Fe Fumarate-FA (PRENATAL VITAMIN PO), Take 1 tablet by mouth daily., Disp: , Rfl:    progesterone (PROMETRIUM) 200 MG capsule, Take 200 mg by mouth at bedtime., Disp: , Rfl:    promethazine (PHENERGAN) 25 MG tablet, Take 25-50 mg by mouth every 8 (eight) hours as needed for vomiting or nausea., Disp: , Rfl:    RABEprazole (ACIPHEX) 20 MG tablet, Take 20 mg by mouth daily. , Disp: , Rfl:    sertraline (ZOLOFT) 100 MG tablet, Take 150 mg by mouth daily. , Disp: , Rfl:    sulfamethoxazole-trimethoprim (BACTRIM DS) 800-160 MG tablet, Take 1 tablet by mouth 2 (two) times daily. (Patient not taking: Reported on 01/13/2022), Disp: 20 tablet, Rfl: 0   tamsulosin (FLOMAX) 0.4 MG CAPS capsule, Take 0.4 mg by mouth daily as needed (kidney stones)., Disp: , Rfl:    traZODone (DESYREL) 50 MG tablet, Take 50  mg by mouth at bedtime as needed for sleep., Disp: , Rfl:    Ubrogepant (UBRELVY) 100 MG TABS, Take 100 mg by mouth 2 (two) times daily as needed (migraine)., Disp: , Rfl:    valACYclovir (VALTREX) 1000 MG tablet, Take 1,000 mg by mouth daily., Disp: , Rfl:    VITAMIN D PO, Take 50,000 Units by mouth every morning. Pt gets otc vitamin d3 takes enough to equal 50,000 units per day, Disp: , Rfl:   Cardiovascular and other pertinent studies:  EKG 12/15/2020: Sinus rhythm 85 bpm Normal EKG  Exercise treadmill stress test 03/31/2019:   Indication: Tachycardia   The patient exercised on Bruce protocol for  9:00 min. Patient achieved  10.16 METS and reached HR  171 bpm,  which is   44% of maximum age-predicted HR.  Stress test terminated due to Fatigue.   Exercise capacity was normal. HR Response to Exercise: Appropriate. BP Response to Exercise: Normal resting BP- appropriate response. Chest Pain: none.Arrhythmias: none. Resting EKG demonstrates Normal sinus rhythm. ST Changes: With peak exercise there was no ST-T changes of ischemia.   Overall Impression: Normal stress test. Recommendations: Continue primary/secondary prevention.    30 day event monitor 2020:  6 patient triggered events occurred with 1 episode of reported symptoms of shortness of breath, lightheaded on day 2 that correlated with NSR. No reported symptoms on other patient triggered events. Occasional sinus tachycardia with max heart rate of 158 bpm. No symptoms. No A fib or SVT was noted.   Recent labs: 01/13/2022: Glucose 65, BUN/Cr 15/0.80. EGFR NA. Na/K 140/3.7.  H/H 10.7/34. MCV 82. Platelets 253  Latest Reference Range & Units 01/13/22 03:20 01/13/22 05:18  Troponin I (High Sensitivity) <18 ng/L 9 31 (H)  (H): Data is abnormally high  Exercise treadmill stress test 03/31/2019:   Indication: Tachycardia   The patient exercised on Bruce protocol for  9:00 min. Patient achieved  10.16 METS and reached HR  171 bpm, which is   44% of maximum age-predicted HR.  Stress test terminated due to Fatigue.   Exercise capacity was normal. HR Response to Exercise: Appropriate. BP Response to Exercise: Normal resting BP- appropriate response. Chest Pain: none.Arrhythmias: none. Resting EKG demonstrates Normal sinus rhythm. ST Changes: With peak exercise there was no ST-T changes of ischemia.   Overall Impression: Normal stress test. Recommendations: Continue primary/secondary prevention.    Review of Systems  Cardiovascular:  Negative for chest pain, dyspnea on exertion, leg swelling, palpitations and syncope.        There were no vitals filed for this visit.    There is no  height or weight on file to calculate BMI. There were no vitals filed for this visit.    Objective:   Physical Exam Vitals and nursing note reviewed.  Constitutional:      General: She is not in acute distress. Neck:     Vascular: No JVD.  Cardiovascular:     Rate and Rhythm: Normal rate and regular rhythm.     Heart sounds: Normal heart sounds. No murmur heard. Pulmonary:     Effort: Pulmonary effort is normal.     Breath sounds: Normal breath sounds. No wheezing or rales.         Assessment & Recommendations:   46 y.o. African-American female with PSVT  PSVT: ***  Preop risk stratification: Good functional capacity in spite of left hip pain.  No significant angina/angina equivalent symptoms.  Normal dyspnea EKG and physical exam.  Prior  episode of troponin elevation in July 2021 likely supply demand mismatch in the setting of supraventricular tachycardia with HR upwards of 200 bpm.  Currently, she does not have any significant palpitation symptoms.  Overall, her cardiac risk for hip surgery is low.  No further testing is necessary.  PSVT: Patient is well aware of vagal maneuvers. Given her infrequent symptoms, I do not recommend any other medical or ablation treatment at this time.  In future, if she has recurrence of PSVT, could consider antiarrhythmic therapy or ablation.  I will see her on as-needed basis.  Thank you for referring the patient to [place]. Please feel free to contact with any questions.   Nigel Mormon, MD Pager: [phone] Office: [phone]

## 2022-01-20 ENCOUNTER — Ambulatory Visit: Payer: BC Managed Care – PPO | Admitting: Cardiology

## 2022-01-20 ENCOUNTER — Encounter: Payer: Self-pay | Admitting: Cardiology

## 2022-01-20 VITALS — BP 109/68 | HR 87 | Resp 16 | Ht 67.0 in | Wt 208.0 lb

## 2022-01-20 DIAGNOSIS — I471 Supraventricular tachycardia: Secondary | ICD-10-CM | POA: Diagnosis not present

## 2022-01-20 MED ORDER — METOPROLOL TARTRATE 25 MG PO TABS: 12.5000 mg | ORAL_TABLET | Freq: Two times a day (BID) | ORAL | 3 refills | Status: DC

## 2022-01-30 DIAGNOSIS — Z3490 Encounter for supervision of normal pregnancy, unspecified, unspecified trimester: Secondary | ICD-10-CM | POA: Diagnosis not present

## 2022-01-31 ENCOUNTER — Other Ambulatory Visit: Payer: BC Managed Care – PPO

## 2022-02-10 ENCOUNTER — Ambulatory Visit: Payer: BC Managed Care – PPO

## 2022-02-10 DIAGNOSIS — I471 Supraventricular tachycardia: Secondary | ICD-10-CM

## 2022-02-13 DIAGNOSIS — O0991 Supervision of high risk pregnancy, unspecified, first trimester: Secondary | ICD-10-CM | POA: Diagnosis not present

## 2022-02-17 ENCOUNTER — Encounter: Payer: Self-pay | Admitting: Cardiology

## 2022-02-17 ENCOUNTER — Ambulatory Visit: Payer: BC Managed Care – PPO | Admitting: Cardiology

## 2022-02-17 DIAGNOSIS — I471 Supraventricular tachycardia: Secondary | ICD-10-CM

## 2022-02-17 MED ORDER — METOPROLOL TARTRATE 25 MG PO TABS: 12.5000 mg | ORAL_TABLET | Freq: Two times a day (BID) | ORAL | 3 refills | Status: DC

## 2022-02-17 NOTE — Progress Notes (Signed)
Patient referred by Merrilee Seashore, MD for pre-op risk stratification  Subjective:   Sarah Saunders, female    DOB: April 04, 1976, 46 y.o.   MRN: [id number]   Chief Complaint  Patient presents with   PSVT   Follow-up    4 week     HPI  46 y.o. African-American female with history of paroxysmal supraventricular tachycardia, pregnant post embryo transfer IVF on 01/04/2022  Patient is now 9 weeks into gestation.  Patient is doing well.  She has not had any recurrence of palpitation episodes lasting for several minutes.  She has occasional pressure-like sensation, which she attributes to anxiety.  This does not last longer than a couple of seconds.  She is tolerating metoprolol tartrate 12.5 mg twice daily well.    Initial consultation 01/2022: Patient works as a Emergency planning/management officer, is known to have paroxysmal supraventricular tachycardia for long time.  Recently, she has noticed increasing frequency and severity of symptoms.  Patient was seen in [place], ER on 01/13/2022.  Patient was at home, had chest got up from bed to go to the bathroom at 2:30 AM.  She started having rapid palpitations symptoms that did not improve after vagal maneuvers.  Episode lasted for about 20 minutes.  She called EMS.  SVT was noted on EKG, that resolved with 6 mg adenosine, see tracing below.  Patient has not had any recurrent episodes since then.  She was started on metoprolol tartrate in the ER which she is taking 25 mg once daily.  Patient used to drink 1 cup of Caffeine before, but is now drinking 1 cup of decaf coffee.  Of note, patient underwent embryo transfer for in vitro fertilization on 01/04/2022.  This is patient's second pregnancy with her first child being 46 years old.  IVF is managed by Dr. Lyla Glassing at [facility] in [place], Dr. Cletis Media is her OB/GYN here in town.  From early trends, it appears that IVF is successful and patient is pregnant.    Current Outpatient Medications:    albuterol (PROVENTIL  HFA;VENTOLIN HFA) 108 (90 BASE) MCG/ACT inhaler, Inhale 2 puffs into the lungs every 4 (four) hours as needed for wheezing or shortness of breath., Disp: , Rfl:    aspirin EC 81 MG tablet, Take 81 mg by mouth daily. Swallow whole., Disp: , Rfl:    Azelastine HCl 137 MCG/SPRAY SOLN, Place 1 spray into both nostrils daily., Disp: , Rfl:    bromocriptine (PARLODEL) 2.5 MG tablet, Take 5 mg by mouth daily., Disp: , Rfl:    Calcium Citrate-Vitamin D (CALCIUM CITRATE +D PO), Take 1 capsule by mouth in the morning, at noon, in the evening, and at bedtime. 4 times a day, Disp: , Rfl:    Cetirizine HCl (ZYRTEC ALLERGY) 10 MG CAPS, Take by oral route., Disp: , Rfl:    Estradiol Valerate 10 MG/ML OIL, Inject 0.43 mLs into the muscle 2 (two) times a week. Monday,friday, Disp: , Rfl:    Fe Fum-Fe Poly-Vit C-Lactobac (FUSION PO), Take 1 capsule by mouth daily., Disp: , Rfl:    ipratropium (ATROVENT) 0.03 % nasal spray, Place 1 spray into both nostrils in the morning and at bedtime., Disp: , Rfl:    metoprolol tartrate (LOPRESSOR) 25 MG tablet, Take 0.5 tablets (12.5 mg total) by mouth 2 (two) times daily., Disp: 60 tablet, Rfl: 3   Prenatal Vit-Fe Fumarate-FA (PRENATAL VITAMIN PO), Take 1 tablet by mouth daily., Disp: , Rfl:    progesterone (PROMETRIUM)  200 MG capsule, Take 200 mg by mouth at bedtime., Disp: , Rfl:    promethazine (PHENERGAN) 25 MG tablet, Take 25-50 mg by mouth every 8 (eight) hours as needed for vomiting or nausea., Disp: , Rfl:    RABEprazole (ACIPHEX) 20 MG tablet, Take 20 mg by mouth daily. , Disp: , Rfl:    sertraline (ZOLOFT) 100 MG tablet, Take 150 mg by mouth daily. , Disp: , Rfl:    tamsulosin (FLOMAX) 0.4 MG CAPS capsule, Take 0.4 mg by mouth daily as needed (kidney stones)., Disp: , Rfl:    traZODone (DESYREL) 50 MG tablet, Take 50 mg by mouth at bedtime as needed for sleep., Disp: , Rfl:    Ubrogepant (UBRELVY) 100 MG TABS, Take 100 mg by mouth 2 (two) times daily as needed  (migraine)., Disp: , Rfl:    valACYclovir (VALTREX) 1000 MG tablet, Take 1,000 mg by mouth daily., Disp: , Rfl:    VITAMIN D PO, Take 50,000 Units by mouth every morning. Pt gets otc vitamin d3 takes enough to equal 50,000 units per day, Disp: , Rfl:   Cardiovascular and other pertinent studies:  Echocardiogram 02/10/2022:  Normal LV systolic function with visual EF 55-60%. Left ventricle cavity  is normal in size. Normal left ventricular wall thickness. Normal global  wall motion. Normal diastolic filling pattern, normal LAP.  Mild tricuspid regurgitation. No evidence of pulmonary hypertension.  No prior study for comparison.  Telemetry 01/13/2022 (EMS):    EKG 01/20/2022: Sinus rhythm 74 bpm Normal EKG   Recent labs: 01/13/2022: Glucose 65, BUN/Cr 15/0.80. EGFR NA. Na/K 140/3.7.  H/H 10.7/34. MCV 82. Platelets 253  Latest Reference Range & Units 01/13/22 03:20 01/13/22 05:18  Troponin I (High Sensitivity) <18 ng/L 9 31 (H)  (H): Data is abnormally high    Review of Systems  Cardiovascular:  Negative for chest pain, dyspnea on exertion, leg swelling, palpitations and syncope.         Vitals:   02/17/22 0856  BP: 120/81  Pulse: 76  Resp: 16  Temp: 98.7 F (37.1 C)  SpO2: 98%     Body mass index is 31.79 kg/m. Filed Weights   02/17/22 0856  Weight: 203 lb (92.1 kg)     Objective:   Physical Exam Vitals and nursing note reviewed.  Constitutional:      General: She is not in acute distress. Neck:     Vascular: No JVD.  Cardiovascular:     Rate and Rhythm: Normal rate and regular rhythm.     Heart sounds: Normal heart sounds. No murmur heard. Pulmonary:     Effort: Pulmonary effort is normal.     Breath sounds: Normal breath sounds. No wheezing or rales.  Musculoskeletal:     Right lower leg: No edema.     Left lower leg: No edema.          Assessment & Recommendations:    46 y.o. African-American female with history of paroxysmal  supraventricular tachycardia, pregnant post embryo transfer IVF on 01/04/2022  PSVT: No recurrence on metoprolol tartrate 12.5 mg twice daily.  Continue the same.    In first trimester early pregnancy post IVF.Obviously, would like to avoid medications as far as possible. However, given increased frequency, metoprolol use is reasonable.  During initial consultation visit, discussed risks and benefits, alternate options with the patient. Will definitely avoid AAD. Ablation with radiation shield is an option, if not controlled by metoprolol.   F/u 3 months  Nigel Mormon, MD Pager: [phone] Office: [phone]

## 2022-02-27 DIAGNOSIS — O0991 Supervision of high risk pregnancy, unspecified, first trimester: Secondary | ICD-10-CM | POA: Diagnosis not present

## 2022-03-16 DIAGNOSIS — Z3A12 12 weeks gestation of pregnancy: Secondary | ICD-10-CM | POA: Diagnosis not present

## 2022-03-16 DIAGNOSIS — N912 Amenorrhea, unspecified: Secondary | ICD-10-CM | POA: Diagnosis not present

## 2022-03-16 DIAGNOSIS — O3680X9 Pregnancy with inconclusive fetal viability, other fetus: Secondary | ICD-10-CM | POA: Diagnosis not present

## 2022-03-22 DIAGNOSIS — Z362 Encounter for other antenatal screening follow-up: Secondary | ICD-10-CM | POA: Diagnosis not present

## 2022-03-22 DIAGNOSIS — Z331 Pregnant state, incidental: Secondary | ICD-10-CM | POA: Diagnosis not present

## 2022-03-22 DIAGNOSIS — Z124 Encounter for screening for malignant neoplasm of cervix: Secondary | ICD-10-CM | POA: Diagnosis not present

## 2022-03-22 DIAGNOSIS — Z113 Encounter for screening for infections with a predominantly sexual mode of transmission: Secondary | ICD-10-CM | POA: Diagnosis not present

## 2022-04-20 ENCOUNTER — Other Ambulatory Visit: Payer: Self-pay

## 2022-04-20 ENCOUNTER — Other Ambulatory Visit: Payer: Self-pay | Admitting: Obstetrics and Gynecology

## 2022-04-20 DIAGNOSIS — Z363 Encounter for antenatal screening for malformations: Secondary | ICD-10-CM

## 2022-04-28 ENCOUNTER — Encounter: Payer: Self-pay | Admitting: Hematology

## 2022-05-02 ENCOUNTER — Encounter: Payer: Self-pay | Admitting: *Deleted

## 2022-05-05 ENCOUNTER — Ambulatory Visit: Payer: BC Managed Care – PPO | Attending: Obstetrics and Gynecology

## 2022-05-05 ENCOUNTER — Encounter: Payer: Self-pay | Admitting: Hematology

## 2022-05-05 ENCOUNTER — Other Ambulatory Visit: Payer: Self-pay | Admitting: *Deleted

## 2022-05-05 ENCOUNTER — Ambulatory Visit: Payer: BC Managed Care – PPO | Admitting: *Deleted

## 2022-05-05 ENCOUNTER — Ambulatory Visit (HOSPITAL_BASED_OUTPATIENT_CLINIC_OR_DEPARTMENT_OTHER): Payer: BC Managed Care – PPO | Admitting: Obstetrics

## 2022-05-05 ENCOUNTER — Encounter: Payer: Self-pay | Admitting: *Deleted

## 2022-05-05 VITALS — BP 122/81 | HR 79

## 2022-05-05 DIAGNOSIS — O99842 Bariatric surgery status complicating pregnancy, second trimester: Secondary | ICD-10-CM | POA: Insufficient documentation

## 2022-05-05 DIAGNOSIS — O09812 Supervision of pregnancy resulting from assisted reproductive technology, second trimester: Secondary | ICD-10-CM | POA: Insufficient documentation

## 2022-05-05 DIAGNOSIS — Z3A20 20 weeks gestation of pregnancy: Secondary | ICD-10-CM | POA: Insufficient documentation

## 2022-05-05 DIAGNOSIS — O99212 Obesity complicating pregnancy, second trimester: Secondary | ICD-10-CM

## 2022-05-05 DIAGNOSIS — O09522 Supervision of elderly multigravida, second trimester: Secondary | ICD-10-CM | POA: Insufficient documentation

## 2022-05-05 DIAGNOSIS — Z9884 Bariatric surgery status: Secondary | ICD-10-CM

## 2022-05-05 DIAGNOSIS — D352 Benign neoplasm of pituitary gland: Secondary | ICD-10-CM

## 2022-05-05 DIAGNOSIS — Z363 Encounter for antenatal screening for malformations: Secondary | ICD-10-CM

## 2022-05-05 DIAGNOSIS — O99891 Other specified diseases and conditions complicating pregnancy: Secondary | ICD-10-CM | POA: Diagnosis not present

## 2022-05-05 NOTE — Progress Notes (Signed)
MFM Note  Sarah Saunders was seen for a detailed fetal anatomy scan due to advanced maternal age (46 years old), IVF pregnancy, history of SVT, gastric bypass surgery, and prolactinoma.  This is an IVF pregnancy conceived using eggs from a donor who was 72+ years old.  She had gastric bypass surgery (duodenal switch) performed in 2018.  She has lost about 140 pounds since the surgery.  She experienced SVT early in her current pregnancy and had to be cardioverted using adenosine.  Her cardiologist prescribed Lopressor (metoprolol) for prophylaxis.  However, she discontinued Lopressor as it was making her blood pressures too low.  The patient discontinued bromocriptine for management of her prolactinoma early in her current pregnancy.  She reports that she continues to have headaches possibly related to the prolactinoma.  She has yearly eye exams for visual field testing to determine if the prolactinoma has been increasing in size.  She had a cell free DNA test (Materni T21) earlier in her pregnancy which indicated a low risk for trisomy 72, 42, and 87. A female fetus is predicted.   She was informed that the fetal growth and amniotic fluid level were appropriate for her gestational age.   On today's exam, an intracardiac echogenic focus was noted in the left ventricle of the fetal heart.    The small association between an echogenic focus and Down syndrome was discussed.   The patient was informed that anomalies may be missed due to technical limitations. If the fetus is in a suboptimal position or maternal habitus is increased, visualization of the fetus in the maternal uterus may be impaired.  The following were discussed during today's consultation:  Advanced maternal age in pregnancy  The increased risk of fetal aneuploidy due to advanced maternal age was discussed.   She was reassured that as this pregnancy was conceived using a donor egg from a 47+-year-old, the fetus' risk for Down  syndrome is probably lower.  Due to advanced maternal age and the echogenic focus noted in the fetal heart today, the patient was offered and declined an amniocentesis today for definitive diagnosis of fetal aneuploidy.  She is comfortable with her negative cell free DNA test.  The increased risk of gestational diabetes and preeclampsia associated with advanced maternal age was discussed. She should start taking a daily baby aspirin (81 mg each) for preeclampsia prophylaxis and continue the daily baby aspirin for the duration of her pregnancy.  As she is over 41 years old, weekly fetal testing should be started at around 34 weeks.  As women over the age of 39 are at increased risk of stillbirth, delivery should occur at around 42 weeks.  History of gastric bypass surgery and obesity  The patient reports that she has not gained much weight in her pregnancy.  She was advised that the recommended weight gain in pregnancy is between 10 to 20 pounds.  The small association of fetal growth restriction associated with women with gastric bypass surgery was discussed.    We will continue to follow her with monthly growth ultrasounds.  As women with gastric bypass surgery may not be able to tolerate a large glucose load, she should be screened for gestational diabetes by monitoring her fasting and 2-hour postprandial fingerstick values for 1 to 2 weeks, rather than the traditional 1 or 2-hour screening test for gestational diabetes.  An anesthesia consult should be obtained when she is admitted for delivery.  History of SVT  The patient was advised to continue  follow-up with her cardiologist for the management of SVT.  Should she continue to feel palpitations, a beta-blocker such as metoprolol or propranolol may be taken during pregnancy.  History of prolactinoma  She was advised to follow-up with her endocrinologist should she continue to experience headaches possibly related to the  prolactinoma.  Should her headaches persist, either bromocriptine or cabergoline may be used during pregnancy for treatment. She should undergo visual field testing again in 4 to 6 weeks.  Should she be placed back on bromocriptine or cabergoline, these medications should be discontinued after birth to enable lactation and breast-feeding.  IVF pregnancy  The increased risk of congenital heart defects associated with an IVF pregnancy was discussed.  She was reassured that based on the normal views of the fetal heart that were obtained today, her baby's risk of a congenital heart defect is low.  She was offered and declined a fetal echocardiogram with pediatric cardiology. She was comfortable with the fetal cardiac views we obtained today.    We will reassess the views of the fetal heart at her next exam.  We will continue to follow her closely with you throughout her pregnancy.    A follow-up exam was scheduled in 4 weeks.  The patient stated that all of her questions were answered today.  A total of 60 minutes was spent counseling and coordinating the care for this patient.  Greater than 50% of the time was spent in direct face-to-face contact.

## 2022-05-08 DIAGNOSIS — Z23 Encounter for immunization: Secondary | ICD-10-CM | POA: Diagnosis not present

## 2022-05-15 ENCOUNTER — Encounter: Payer: Self-pay | Admitting: Hematology

## 2022-05-22 ENCOUNTER — Ambulatory Visit: Payer: BC Managed Care – PPO | Admitting: Cardiology

## 2022-05-31 ENCOUNTER — Ambulatory Visit (INDEPENDENT_AMBULATORY_CARE_PROVIDER_SITE_OTHER): Payer: BC Managed Care – PPO | Admitting: Cardiology

## 2022-05-31 ENCOUNTER — Encounter: Payer: Self-pay | Admitting: Cardiology

## 2022-05-31 ENCOUNTER — Encounter: Payer: Self-pay | Admitting: Hematology

## 2022-05-31 VITALS — BP 104/59 | HR 85 | Temp 97.3°F | Resp 16 | Ht 67.0 in | Wt 227.0 lb

## 2022-05-31 DIAGNOSIS — I471 Supraventricular tachycardia, unspecified: Secondary | ICD-10-CM

## 2022-05-31 NOTE — Progress Notes (Signed)
Patient referred by Merrilee Seashore, MD for PSVT  Subjective:   Sarah Saunders, female    DOB: Jan 04, 1976, 46 y.o.   MRN: [id number]   Chief Complaint  Patient presents with   paroxysmal supraventricular tachycardia   Follow-up     HPI  46 y.o. African-American female with history of paroxysmal supraventricular tachycardia, pregnant post embryo transfer IVF on 01/04/2022  Patient has not been taking metoprolol regularly. In spite of that, she has had only two perceived episodes of PSVT, one lasting for a few seconds, the other lasing for up to 4 min. Otherwise, she is doing well. She expects to have the baby in January 2023.   Initial consultation 01/2022: Patient works as a Emergency planning/management officer, is known to have paroxysmal supraventricular tachycardia for long time.  Recently, she has noticed increasing frequency and severity of symptoms.  Patient was seen in [place], ER on 01/13/2022.  Patient was at home, had chest got up from bed to go to the bathroom at 2:30 AM.  She started having rapid palpitations symptoms that did not improve after vagal maneuvers.  Episode lasted for about 20 minutes.  She called EMS.  SVT was noted on EKG, that resolved with 6 mg adenosine, see tracing below.  Patient has not had any recurrent episodes since then.  She was started on metoprolol tartrate in the ER which she is taking 25 mg once daily.  Patient used to drink 1 cup of Caffeine before, but is now drinking 1 cup of decaf coffee.  Of note, patient underwent embryo transfer for in vitro fertilization on 01/04/2022.  This is patient's second pregnancy with her first child being 46 years old.  IVF is managed by Dr. Lyla Glassing at [facility] in [place], Dr. Cletis Media is her OB/GYN here in town.  From early trends, it appears that IVF is successful and patient is pregnant.    Current Outpatient Medications:    albuterol (PROVENTIL HFA;VENTOLIN HFA) 108 (90 BASE) MCG/ACT inhaler, Inhale 2 puffs into the lungs every 4  (four) hours as needed for wheezing or shortness of breath., Disp: , Rfl:    aspirin EC 81 MG tablet, Take 81 mg by mouth daily. Swallow whole., Disp: , Rfl:    Azelastine HCl 137 MCG/SPRAY SOLN, Place 1 spray into both nostrils daily., Disp: , Rfl:    bromocriptine (PARLODEL) 2.5 MG tablet, Take 5 mg by mouth daily. (Patient not taking: Reported on 05/05/2022), Disp: , Rfl:    Calcium Citrate-Vitamin D (CALCIUM CITRATE +D PO), Take 1 capsule by mouth in the morning, at noon, in the evening, and at bedtime. 4 times a day, Disp: , Rfl:    Cetirizine HCl (ZYRTEC ALLERGY) 10 MG CAPS, Take by oral route., Disp: , Rfl:    Estradiol Valerate 10 MG/ML OIL, Inject 0.43 mLs into the muscle 2 (two) times a week. Monday,friday (Patient not taking: Reported on 05/05/2022), Disp: , Rfl:    Fe Fum-Fe Poly-Vit C-Lactobac (FUSION PO), Take 1 capsule by mouth daily., Disp: , Rfl:    ipratropium (ATROVENT) 0.03 % nasal spray, Place 1 spray into both nostrils in the morning and at bedtime., Disp: , Rfl:    metoprolol tartrate (LOPRESSOR) 25 MG tablet, Take 0.5 tablets (12.5 mg total) by mouth 2 (two) times daily. (Patient not taking: Reported on 05/05/2022), Disp: 90 tablet, Rfl: 3   Prenatal Vit-Fe Fumarate-FA (PRENATAL VITAMIN PO), Take 1 tablet by mouth daily., Disp: , Rfl:    progesterone (  PROMETRIUM) 200 MG capsule, Take 200 mg by mouth at bedtime. (Patient not taking: Reported on 05/05/2022), Disp: , Rfl:    promethazine (PHENERGAN) 25 MG tablet, Take 25-50 mg by mouth every 8 (eight) hours as needed for vomiting or nausea. (Patient not taking: Reported on 05/05/2022), Disp: , Rfl:    RABEprazole (ACIPHEX) 20 MG tablet, Take 20 mg by mouth daily. , Disp: , Rfl:    sertraline (ZOLOFT) 100 MG tablet, Take 150 mg by mouth daily. , Disp: , Rfl:    tamsulosin (FLOMAX) 0.4 MG CAPS capsule, Take 0.4 mg by mouth daily as needed (kidney stones)., Disp: , Rfl:    traZODone (DESYREL) 50 MG tablet, Take 50 mg by mouth at bedtime  as needed for sleep. (Patient not taking: Reported on 05/05/2022), Disp: , Rfl:    Ubrogepant (UBRELVY) 100 MG TABS, Take 100 mg by mouth 2 (two) times daily as needed (migraine). (Patient not taking: Reported on 05/05/2022), Disp: , Rfl:    valACYclovir (VALTREX) 1000 MG tablet, Take 1,000 mg by mouth daily. (Patient not taking: Reported on 05/05/2022), Disp: , Rfl:    VITAMIN D PO, Take 50,000 Units by mouth every morning. Pt gets otc vitamin d3 takes enough to equal 50,000 units per day (Patient not taking: Reported on 05/05/2022), Disp: , Rfl:   Cardiovascular and other pertinent studies:  Echocardiogram 02/10/2022:  Normal LV systolic function with visual EF 55-60%. Left ventricle cavity  is normal in size. Normal left ventricular wall thickness. Normal global  wall motion. Normal diastolic filling pattern, normal LAP.  Mild tricuspid regurgitation. No evidence of pulmonary hypertension.  No prior study for comparison.  Telemetry 01/13/2022 (EMS):    EKG 01/20/2022: Sinus rhythm 74 bpm Normal EKG   Recent labs: 01/13/2022: Glucose 65, BUN/Cr 15/0.80. EGFR NA. Na/K 140/3.7.  H/H 10.7/34. MCV 82. Platelets 253  Latest Reference Range & Units 01/13/22 03:20 01/13/22 05:18  Troponin I (High Sensitivity) <18 ng/L 9 31 (H)  (H): Data is abnormally high    Review of Systems  Cardiovascular:  Negative for chest pain, dyspnea on exertion, leg swelling, palpitations and syncope.         Vitals:   05/31/22 1047  BP: (!) 104/59  Pulse: 85  Resp: 16  Temp: (!) 97.3 F (36.3 C)  SpO2: 95%     Body mass index is 35.55 kg/m. Filed Weights   05/31/22 1047  Weight: 227 lb (103 kg)     Objective:   Physical Exam Vitals and nursing note reviewed.  Constitutional:      General: She is not in acute distress. Neck:     Vascular: No JVD.  Cardiovascular:     Rate and Rhythm: Normal rate and regular rhythm.     Heart sounds: Normal heart sounds. No murmur heard. Pulmonary:      Effort: Pulmonary effort is normal.     Breath sounds: Normal breath sounds. No wheezing or rales.  Musculoskeletal:     Right lower leg: No edema.     Left lower leg: No edema.          Assessment & Recommendations:    46 y.o. African-American female with history of paroxysmal supraventricular tachycardia, pregnant post embryo transfer IVF on 01/04/2022  PSVT: Infrequent episodes even without regular use of metoprolol. She does not want to take metoprolol everyday due to low blood pressure. Okay to take it as needed. Will monitor on Cardiologs. In future, she would like to consider ablation,  if needed.  F/u in 6 months    Nigel Mormon, MD Pager: [phone] Office: [phone]

## 2022-06-02 ENCOUNTER — Ambulatory Visit: Payer: BC Managed Care – PPO | Admitting: *Deleted

## 2022-06-02 ENCOUNTER — Other Ambulatory Visit: Payer: Self-pay | Admitting: *Deleted

## 2022-06-02 ENCOUNTER — Ambulatory Visit: Payer: BC Managed Care – PPO | Attending: Obstetrics

## 2022-06-02 VITALS — BP 102/66 | HR 87

## 2022-06-02 DIAGNOSIS — O09812 Supervision of pregnancy resulting from assisted reproductive technology, second trimester: Secondary | ICD-10-CM | POA: Diagnosis not present

## 2022-06-02 DIAGNOSIS — Z9884 Bariatric surgery status: Secondary | ICD-10-CM | POA: Diagnosis not present

## 2022-06-02 DIAGNOSIS — Z3A24 24 weeks gestation of pregnancy: Secondary | ICD-10-CM | POA: Insufficient documentation

## 2022-06-02 DIAGNOSIS — O321XX0 Maternal care for breech presentation, not applicable or unspecified: Secondary | ICD-10-CM | POA: Diagnosis not present

## 2022-06-02 DIAGNOSIS — O99412 Diseases of the circulatory system complicating pregnancy, second trimester: Secondary | ICD-10-CM | POA: Diagnosis not present

## 2022-06-02 DIAGNOSIS — O99212 Obesity complicating pregnancy, second trimester: Secondary | ICD-10-CM | POA: Insufficient documentation

## 2022-06-02 DIAGNOSIS — H53143 Visual discomfort, bilateral: Secondary | ICD-10-CM | POA: Diagnosis not present

## 2022-06-02 DIAGNOSIS — I471 Supraventricular tachycardia, unspecified: Secondary | ICD-10-CM | POA: Diagnosis not present

## 2022-06-02 DIAGNOSIS — H53453 Other localized visual field defect, bilateral: Secondary | ICD-10-CM | POA: Diagnosis not present

## 2022-06-02 DIAGNOSIS — O09522 Supervision of elderly multigravida, second trimester: Secondary | ICD-10-CM | POA: Insufficient documentation

## 2022-06-19 ENCOUNTER — Encounter (HOSPITAL_COMMUNITY): Payer: Self-pay | Admitting: Obstetrics and Gynecology

## 2022-06-19 ENCOUNTER — Encounter: Payer: Self-pay | Admitting: Hematology

## 2022-06-19 ENCOUNTER — Inpatient Hospital Stay (HOSPITAL_BASED_OUTPATIENT_CLINIC_OR_DEPARTMENT_OTHER): Payer: BC Managed Care – PPO

## 2022-06-19 ENCOUNTER — Inpatient Hospital Stay (HOSPITAL_COMMUNITY)
Admission: AD | Admit: 2022-06-19 | Discharge: 2022-06-21 | DRG: 805 | Disposition: A | Discharge status: Home / Self Care | Payer: BC Managed Care – PPO | Attending: Obstetrics & Gynecology | Admitting: Obstetrics & Gynecology

## 2022-06-19 ENCOUNTER — Other Ambulatory Visit: Payer: Self-pay

## 2022-06-19 DIAGNOSIS — O9942 Diseases of the circulatory system complicating childbirth: Secondary | ICD-10-CM | POA: Diagnosis present

## 2022-06-19 DIAGNOSIS — O368320 Maternal care for abnormalities of the fetal heart rate or rhythm, second trimester, not applicable or unspecified: Secondary | ICD-10-CM

## 2022-06-19 DIAGNOSIS — Z3A00 Weeks of gestation of pregnancy not specified: Secondary | ICD-10-CM | POA: Diagnosis not present

## 2022-06-19 DIAGNOSIS — O99214 Obesity complicating childbirth: Secondary | ICD-10-CM | POA: Diagnosis present

## 2022-06-19 DIAGNOSIS — O09812 Supervision of pregnancy resulting from assisted reproductive technology, second trimester: Secondary | ICD-10-CM | POA: Diagnosis not present

## 2022-06-19 DIAGNOSIS — Z3A26 26 weeks gestation of pregnancy: Secondary | ICD-10-CM | POA: Diagnosis not present

## 2022-06-19 DIAGNOSIS — I471 Supraventricular tachycardia, unspecified: Secondary | ICD-10-CM | POA: Diagnosis not present

## 2022-06-19 DIAGNOSIS — O364XX0 Maternal care for intrauterine death, not applicable or unspecified: Principal | ICD-10-CM | POA: Diagnosis present

## 2022-06-19 DIAGNOSIS — O368120 Decreased fetal movements, second trimester, not applicable or unspecified: Secondary | ICD-10-CM | POA: Diagnosis not present

## 2022-06-19 DIAGNOSIS — O3622X0 Maternal care for hydrops fetalis, second trimester, not applicable or unspecified: Secondary | ICD-10-CM | POA: Diagnosis not present

## 2022-06-19 DIAGNOSIS — O09519 Supervision of elderly primigravida, unspecified trimester: Secondary | ICD-10-CM | POA: Diagnosis not present

## 2022-06-19 DIAGNOSIS — O09522 Supervision of elderly multigravida, second trimester: Secondary | ICD-10-CM | POA: Diagnosis not present

## 2022-06-19 DIAGNOSIS — O9902 Anemia complicating childbirth: Secondary | ICD-10-CM | POA: Diagnosis not present

## 2022-06-19 DIAGNOSIS — O321XX0 Maternal care for breech presentation, not applicable or unspecified: Secondary | ICD-10-CM | POA: Diagnosis present

## 2022-06-19 LAB — COMPREHENSIVE METABOLIC PANEL
ALT: 12 U/L (ref 0–44)
AST: 37 U/L (ref 15–41)
Albumin: 2.7 g/dL — ABNORMAL LOW (ref 3.5–5.0)
Alkaline Phosphatase: 84 U/L (ref 38–126)
Anion gap: 8 (ref 5–15)
BUN: 10 mg/dL (ref 6–20)
CO2: 18 mmol/L — ABNORMAL LOW (ref 22–32)
Calcium: 9.1 mg/dL (ref 8.9–10.3)
Chloride: 111 mmol/L (ref 98–111)
Creatinine, Ser: 0.74 mg/dL (ref 0.44–1.00)
GFR, Estimated: >60 mL/min (ref >60)
Glucose, Bld: 77 mg/dL (ref 70–99)
Potassium: 4.9 mmol/L (ref 3.5–5.1)
Sodium: 137 mmol/L (ref 135–145)
Total Bilirubin: 0.6 mg/dL (ref 0.3–1.2)
Total Protein: 6.4 g/dL — ABNORMAL LOW (ref 6.5–8.1)

## 2022-06-19 LAB — PROTEIN / CREATININE RATIO, URINE
Creatinine, Urine: 75 mg/dL
Protein Creatinine Ratio: 0.11 mg/mg{Cre} (ref 0.00–0.15)
Total Protein, Urine: 8 mg/dL

## 2022-06-19 LAB — DIC (DISSEMINATED INTRAVASCULAR COAGULATION)PANEL
D-Dimer, Quant: 4.06 ug/mL-FEU — ABNORMAL HIGH (ref 0.00–0.50)
Fibrinogen: 420 mg/dL (ref 210–475)
INR: 1 (ref 0.8–1.2)
Platelets: 170 10*3/uL (ref 150–400)
Prothrombin Time: 13.3 seconds (ref 11.4–15.2)
Smear Review: NONE SEEN
aPTT: 26 seconds (ref 24–36)

## 2022-06-19 LAB — CBC
HCT: 31.1 % — ABNORMAL LOW (ref 36.0–46.0)
Hemoglobin: 9.8 g/dL — ABNORMAL LOW (ref 12.0–15.0)
MCH: 28 pg (ref 26.0–34.0)
MCHC: 31.5 g/dL (ref 30.0–36.0)
MCV: 88.9 fL (ref 80.0–100.0)
Platelets: 171 10*3/uL (ref 150–400)
RBC: 3.5 MIL/uL — ABNORMAL LOW (ref 3.87–5.11)
RDW: 14.6 % (ref 11.5–15.5)
WBC: 7.6 10*3/uL (ref 4.0–10.5)
nRBC: 0 % (ref 0.0–0.2)

## 2022-06-19 LAB — TSH: TSH: 1.71 u[IU]/mL (ref 0.350–4.500)

## 2022-06-19 LAB — HEMOGLOBIN A1C
Hgb A1c MFr Bld: 4.1 % — ABNORMAL LOW (ref 4.8–5.6)
Mean Plasma Glucose: 70.97 mg/dL

## 2022-06-19 LAB — TYPE AND SCREEN
ABO/RH(D): O POS
Antibody Screen: NEGATIVE

## 2022-06-19 MED ORDER — METOPROLOL TARTRATE 12.5 MG HALF TABLET: 12.5000 mg | ORAL_TABLET | Freq: Two times a day (BID) | ORAL | Status: DC | PRN

## 2022-06-19 MED ORDER — DIPHENHYDRAMINE HCL 50 MG/ML IJ SOLN: 12.5000 mg | Freq: Four times a day (QID) | INTRAMUSCULAR | Status: DC | PRN

## 2022-06-19 MED ORDER — IPRATROPIUM BROMIDE 0.03 % NA SOLN: 1.0000 | Freq: Two times a day (BID) | NASAL | Status: DC | PRN

## 2022-06-19 MED ORDER — OXYCODONE-ACETAMINOPHEN 5-325 MG PO TABS
2.0000 | ORAL_TABLET | ORAL | Status: DC | PRN
  Administered 2022-06-20 – 2022-06-21 (×2): 2 via ORAL
  Filled 2022-06-19 (×2): qty 2

## 2022-06-19 MED ORDER — ONDANSETRON HCL 4 MG/2ML IJ SOLN: 4.0000 mg | Freq: Four times a day (QID) | INTRAMUSCULAR | Status: DC | PRN

## 2022-06-19 MED ORDER — NALOXONE HCL 0.4 MG/ML IJ SOLN: 0.4000 mg | INTRAMUSCULAR | Status: DC | PRN

## 2022-06-19 MED ORDER — OXYCODONE-ACETAMINOPHEN 5-325 MG PO TABS
1.0000 | ORAL_TABLET | ORAL | Status: DC | PRN
  Administered 2022-06-20: 1 via ORAL
  Filled 2022-06-19: qty 1

## 2022-06-19 MED ORDER — OXYTOCIN-SODIUM CHLORIDE 30-0.9 UT/500ML-% IV SOLN: 2.5000 [IU]/h | INTRAVENOUS | Status: DC

## 2022-06-19 MED ORDER — SOD CITRATE-CITRIC ACID 500-334 MG/5ML PO SOLN: 30.0000 mL | ORAL | Status: DC | PRN

## 2022-06-19 MED ORDER — ACETAMINOPHEN 325 MG PO TABS: 650.0000 mg | ORAL_TABLET | ORAL | Status: DC | PRN

## 2022-06-19 MED ORDER — ALBUTEROL SULFATE (2.5 MG/3ML) 0.083% IN NEBU: 3.0000 mL | INHALATION_SOLUTION | RESPIRATORY_TRACT | Status: DC | PRN

## 2022-06-19 MED ORDER — METOPROLOL TARTRATE 12.5 MG HALF TABLET
12.5000 mg | ORAL_TABLET | Freq: Two times a day (BID) | ORAL | Status: DC
  Filled 2022-06-19: qty 1

## 2022-06-19 MED ORDER — LACTATED RINGERS IV SOLN: 500.0000 mL | INTRAVENOUS | Status: DC | PRN

## 2022-06-19 MED ORDER — OXYTOCIN-SODIUM CHLORIDE 30-0.9 UT/500ML-% IV SOLN
1.0000 m[IU]/min | INTRAVENOUS | Status: DC
  Administered 2022-06-20: 2 m[IU]/min via INTRAVENOUS
  Filled 2022-06-19: qty 500

## 2022-06-19 MED ORDER — MISOPROSTOL 200 MCG PO TABS
200.0000 ug | ORAL_TABLET | ORAL | Status: AC | PRN
  Administered 2022-06-19 – 2022-06-20 (×3): 200 ug via VAGINAL
  Filled 2022-06-19 (×3): qty 1

## 2022-06-19 MED ORDER — SODIUM CHLORIDE 0.9% FLUSH: 9.0000 mL | INTRAVENOUS | Status: DC | PRN

## 2022-06-19 MED ORDER — FENTANYL 50 MCG/ML IV PCA SOLN
INTRAVENOUS | Status: DC
  Filled 2022-06-19: qty 25

## 2022-06-19 MED ORDER — SERTRALINE HCL 50 MG PO TABS
150.0000 mg | ORAL_TABLET | Freq: Every day | ORAL | Status: DC
  Administered 2022-06-19 – 2022-06-20 (×2): 150 mg via ORAL
  Filled 2022-06-19: qty 3
  Filled 2022-06-19 (×2): qty 1

## 2022-06-19 MED ORDER — LACTATED RINGERS IV SOLN: INTRAVENOUS | Status: DC

## 2022-06-19 MED ORDER — DIPHENHYDRAMINE HCL 12.5 MG/5ML PO ELIX: 12.5000 mg | ORAL_SOLUTION | Freq: Four times a day (QID) | ORAL | Status: DC | PRN

## 2022-06-19 MED ORDER — OXYTOCIN BOLUS FROM INFUSION
333.0000 mL | Freq: Once | INTRAVENOUS | Status: AC
  Administered 2022-06-20: 333 mL via INTRAVENOUS

## 2022-06-19 MED ORDER — LIDOCAINE HCL (PF) 1 % IJ SOLN: 30.0000 mL | INTRAMUSCULAR | Status: DC | PRN

## 2022-06-19 NOTE — MAU Note (Signed)
Pt and husband actively grieving. They are reaching out to family and Doristine Bosworth. Told them they can have anyone they wanted to be back with them at this time.

## 2022-06-19 NOTE — MAU Note (Signed)
Pt decided to stay and be induced. Wanting PCa for pain management. Awaiting admit orders from Provider.

## 2022-06-19 NOTE — H&P (Addendum)
Sarah Saunders is a 46 y.o. female presenting for decreased FM and found to have and IUFD.  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 Social History:  reports that she has never smoked. She has never used smokeless tobacco. She reports that she does not drink alcohol and does not use drugs.     Maternal Diabetes: No Genetic Screening: Normal female Maternal Ultrasounds/Referrals: Other:isolated LVEIF Fetal Ultrasounds or other Referrals:  None Maternal Substance Abuse:  No Significant Maternal Medications:  Meds include: Zoloft Other: metoprolol prn, zoloft, bromocriptine stopped in the 2nd trimester and aciphex. Significant Maternal Lab Results:  None Number of Prenatal Visits:greater than 3 verified prenatal visits Other Comments:  None  Review of Systems Denies F/C/N/V/D History   Last menstrual period 12/15/2021. Exam Physical Exam  Lungs CTA CV RRR Abdomen gravid, NT Extremities no calf tenderness  Prenatal labs: ABO, Rh: --/--/O POS (10/30 1525) Antibody: NEG (10/30 1525) Rubella:  Immune RPR:   NR HBsAg:   Neg HIV:   NR GBS:     Ultrasound today - Scalp edema, ascites and minimal  pleural effusion are seen.  Breech presentation.  Impression: Fetal hydrops.  Assessment/Plan: [id number] at 58 3/7wks being admitted  with IUD after having no FM since Saturday.  MFM recommends fetal microarray after delivery, Torch titers and placenta to pathology.  LAC and ACL Abs ordered as well as HgA1c which was normal. H/o SVT controlled on metoprolol prn H/o pituitary adenoma with prolactinemia not on bromocriptine stopped by MFM in 2nd trimester per pt.  Plan cytotec x 3 then pitocin.  PCA for pain mgmt. D-dimer elevated and coags wnl - asymptomatic Upcr pending  Delice Lesch 06/19/2022, 4:45 PM

## 2022-06-19 NOTE — MAU Note (Signed)
.  Sarah Saunders is a 46 y.o. at 59w4dhere in MAU reporting: pt has not felt baby move since Saturday. Denies any pain or cramping  ,bleeding or leaking.  Unable to get FHR in triage. Had pt come to room and notified provider. Bedside U/s . No FHR observed.

## 2022-06-19 NOTE — MAU Provider Note (Signed)
History     CSN: [id number]  Arrival date and time: 06/19/22 1402   Event Date/Time   First Provider Initiated Contact with Patient 06/19/22 1413      Chief Complaint  Patient presents with   Decreased Fetal Movement   HPI This is a 46 year old G4 P1-0-2-1 at 26 weeks and 4 days with a pregnancy complicated by AMA.  She presents with 3 days of decreased fetal movement.  She has not felt the baby move.  No abdominal pain or cramping.  No abnormal vaginal discharge or vaginal bleeding.  She denies leaking fluid.    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[facility])     Past Surgical History:  Procedure Laterality Date   JOINT REPLACEMENT Left 01/13/2021   hip   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)     TUBAL LIGATION     right fallopian tube removed   tube removed      Family History  Problem Relation Age of Onset   Hypertension Father    Heart failure Father    Hypertension Mother    Heart failure Brother     Social History   Tobacco Use   Smoking status: Never   Smokeless tobacco: Never  Vaping Use   Vaping Use: Never used  Substance Use Topics   Alcohol use: No   Drug use: No    Allergies:  Allergies  Allergen Reactions   Butorphanol Hives and Itching    Medications Prior to Admission  Medication Sig Dispense Refill Last Dose   albuterol (PROVENTIL HFA;VENTOLIN HFA) 108 (90 BASE) MCG/ACT inhaler Inhale 2 puffs into the lungs every 4 (four) hours as needed for wheezing or shortness of breath.      aspirin EC 81 MG tablet Take 81 mg by mouth daily. Swallow whole.      Azelastine HCl 137 MCG/SPRAY SOLN Place 1 spray into both nostrils daily.       bromocriptine (PARLODEL) 2.5 MG tablet Take 5 mg by mouth daily. (Patient not taking: Reported on 05/05/2022)      Calcium Citrate-Vitamin D (CALCIUM CITRATE +D PO) Take 1 capsule by mouth in the morning, at noon, in the evening, and at bedtime. 4 times a day      Cetirizine HCl (ZYRTEC ALLERGY) 10 MG CAPS Take by oral route.      Fe Fum-Fe Poly-Vit C-Lactobac (FUSION PO) Take 1 capsule by mouth daily.      ipratropium (ATROVENT) 0.03 % nasal spray Place 1 spray into both nostrils in the morning and at bedtime.      metoprolol tartrate (LOPRESSOR) 25 MG tablet Take 0.5 tablets (12.5 mg total) by mouth 2 (two) times daily. 90 tablet 3    Prenatal Vit-Fe Fumarate-FA (PRENATAL VITAMIN PO) Take 1 tablet by mouth daily.      RABEprazole (ACIPHEX) 20 MG tablet Take 20 mg by mouth daily.  sertraline (ZOLOFT) 100 MG tablet Take 150 mg by mouth daily.       tamsulosin (FLOMAX) 0.4 MG CAPS capsule Take 0.4 mg by mouth daily as needed (kidney stones).      VITAMIN D PO Take 50,000 Units by mouth every morning. Pt gets otc vitamin d3 takes enough to equal 50,000 units per day       Review of Systems Physical Exam   Last menstrual period 12/15/2021.  Physical Exam Vitals and nursing note reviewed. Exam conducted with a chaperone present.  Constitutional:      Appearance: Normal appearance.  Cardiovascular:     Rate and Rhythm: Normal rate and regular rhythm.  Pulmonary:     Effort: Pulmonary effort is normal.     Breath sounds: Normal breath sounds.  Skin:    Capillary Refill: Capillary refill takes less than 2 seconds.  Neurological:     General: No focal deficit present.     Mental Status: She is alert.  Psychiatric:        Mood and Affect: Mood normal.        Behavior: Behavior normal.        Thought Content: Thought content normal.        Judgment: Judgment normal.    MAU Course  Procedures Bedside US done confirming fetal demise.   Check formal US - check for placental  abruption. Check labs: DIC, antiphospholipid, anti-cardiolipin antibodies, parvovirus  MDM MFM [place] - no placental abruption. Breech position Discussed results with patient. She would like to proceed with delivery.  Discussed patient with Dr Everett Graff - will admit.  Assessment and Plan   1. [redacted] weeks gestation of pregnancy   2. Fetal demise, greater than 22 weeks, antepartum, single or unspecified fetus    Admit for induction  Truett Mainland 06/19/2022, 2:39 PM

## 2022-06-20 ENCOUNTER — Encounter (HOSPITAL_COMMUNITY): Payer: Self-pay | Admitting: Obstetrics and Gynecology

## 2022-06-20 LAB — PARVOVIRUS B19 ANTIBODY, IGG AND IGM
Parovirus B19 IgG Abs: 1.3 index — ABNORMAL HIGH (ref 0.0–0.8)
Parovirus B19 IgM Abs: 0.2 index (ref 0.0–0.8)

## 2022-06-20 LAB — LUPUS ANTICOAGULANT PANEL
DRVVT: 30.3 s (ref 0.0–47.0)
PTT Lupus Anticoagulant: 28.4 s (ref 0.0–43.5)

## 2022-06-20 LAB — RPR: RPR Ser Ql: NONREACTIVE

## 2022-06-20 MED ORDER — BROMOCRIPTINE MESYLATE 2.5 MG PO TABS
5.0000 mg | ORAL_TABLET | Freq: Every day | ORAL | Status: DC
  Administered 2022-06-20 – 2022-06-21 (×2): 5 mg via ORAL
  Filled 2022-06-20 (×2): qty 2

## 2022-06-20 NOTE — Progress Notes (Signed)
CSW received consult due to IUFD.  CSW available for support secondary to [facility] and will await call from [place] before becoming involved.  CSW screening out referral at this time.  Abundio Miu, [place] Worker [facility] Cell#: [phone]

## 2022-06-20 NOTE — Progress Notes (Addendum)
Sarah Saunders is a 46 y.o. [id number] at 13w5dby admitted for induction of labor for fetal demise,  Subjective: Patient feels vaginal pressure and urge to push.   Objective: BP 113/70 (BP Location: Right Arm)   Pulse (!) 102   Temp 99.7 F (37.6 C) (Axillary)   Resp (!) 24   Wt 102.1 kg   LMP 12/15/2021 Comment: embryo transplant on 01/04/22  SpO2 97%   BMI 35.24 kg/m  No intake/output data recorded. No intake/output data recorded.  FHT:  None.  UC:   irregular, every 2 to 3 minutes SVE:   7/100%.  Labs: Lab Results  Component Value Date   WBC 7.6 06/19/2022   HGB 9.8 (L) 06/19/2022   HCT 31.1 (L) 06/19/2022   MCV 88.9 06/19/2022   PLT 171 06/19/2022   PLT 170 06/19/2022    Assessment / Plan: Induction of labor due to fetal demise,  progressing well on pitocin  Labor:  Proceed with pushing as patient with urge to push. Preeclampsia:   None Fetal Wellbeing:   Demise Pain Control:   Fentanyl PCA I/D:  n/a Anticipated MOD:   Vaginal delivery.   EArchie Endo MD 06/20/2022 09:00 AM.

## 2022-06-20 NOTE — Progress Notes (Signed)
Sarah Saunders is a 46 y.o. [id number] at 76w5dadmitted for induction of labor due to IUFD.  Subjective: Patient s/p 3 doses Cytotec 555m PV, last dose at 0230. She is getting more uncomfortable with contractions and dilaudid PCA initiated.   Objective: BP 126/77   Pulse (!) 102   Temp 99.7 F (37.6 C) (Axillary)   Resp (!) 25   Wt 102.1 kg   LMP 12/15/2021 Comment: embryo transplant on 01/04/22  SpO2 99%   BMI 35.24 kg/m  No intake/output data recorded. No intake/output data recorded.  UC:   irregular, every 1-2 minutes, not tracing well SVE:   Dilation: 1.5 Effacement (%): 50 Station: Ballotable Exam by:: SaRonalee BeltsN  Labs: Lab Results  Component Value Date   WBC 7.6 06/19/2022   HGB 9.8 (L) 06/19/2022   HCT 31.1 (L) 06/19/2022   MCV 88.9 06/19/2022   PLT 171 06/19/2022   PLT 170 06/19/2022    Assessment / Plan: Induction of labor due to fetal demise  Labor:  on cytotec , start pitocin 4 hours after last dose of cytotec (0630) Preeclampsia:   n/a Fetal Wellbeing:   fetal demise Pain Control:  IV pain meds, dilaudid PCA\ I/D:  n/a Anticipated MOD:   vaginal   EbJosefine ClassMD 06/20/2022, 4:25 AM

## 2022-06-20 NOTE — Progress Notes (Addendum)
Initial visit with Sarah Saunders and Sarah Saunders at pt's bedside. Chaplain introduced spiritual care services and offered support after the loss of their son, Sarah Saunders. Chaplain provided brief grief education and normalized various responses. Chaplain also introduced local resources for follow up support including our [facility] perinatal loss group.  Pt was accompanied by her sister and community pastors. They expressed gratitude for continued support.  Please page as further needs arise.  Donald Prose. Elyn Peers, M.Div. [facility] Chaplain Pager [phone] Office [phone]

## 2022-06-21 LAB — D-DIMER, QUANTITATIVE: D-Dimer, Quant: >20 ug/mL-FEU — ABNORMAL HIGH (ref 0.00–0.50)

## 2022-06-21 LAB — CBC
HCT: 27.3 % — ABNORMAL LOW (ref 36.0–46.0)
Hemoglobin: 8.5 g/dL — ABNORMAL LOW (ref 12.0–15.0)
MCH: 27.9 pg (ref 26.0–34.0)
MCHC: 31.1 g/dL (ref 30.0–36.0)
MCV: 89.5 fL (ref 80.0–100.0)
Platelets: 135 10*3/uL — ABNORMAL LOW (ref 150–400)
RBC: 3.05 MIL/uL — ABNORMAL LOW (ref 3.87–5.11)
RDW: 14.6 % (ref 11.5–15.5)
WBC: 7.3 10*3/uL (ref 4.0–10.5)
nRBC: 0 % (ref 0.0–0.2)

## 2022-06-21 LAB — CARDIOLIPIN ANTIBODIES, IGG, IGM, IGA
Anticardiolipin IgA: <9 APL U/mL (ref 0–11)
Anticardiolipin IgG: <9 GPL U/mL (ref 0–14)
Anticardiolipin IgM: 9 MPL U/mL (ref 0–12)

## 2022-06-21 LAB — PROLACTIN: Prolactin: 116 ng/mL — ABNORMAL HIGH (ref 4.8–23.3)

## 2022-06-21 MED ORDER — DIPHENHYDRAMINE HCL 25 MG PO CAPS
50.0000 mg | ORAL_CAPSULE | Freq: Four times a day (QID) | ORAL | Status: DC | PRN
  Administered 2022-06-21 (×2): 50 mg via ORAL
  Filled 2022-06-21 (×2): qty 2

## 2022-06-21 MED ORDER — ACETAMINOPHEN 325 MG PO TABS: 650.0000 mg | ORAL_TABLET | ORAL | 1 refills | Status: DC | PRN

## 2022-06-21 MED ORDER — FERROUS SULFATE 325 (65 FE) MG PO TBEC: 325.0000 mg | DELAYED_RELEASE_TABLET | ORAL | 1 refills | Status: DC

## 2022-06-21 NOTE — Discharge Summary (Signed)
Physician Discharge Summary  Patient ID: Sarah Saunders MRN: [id number] DOB/AGE: 08/28/1975 46 y.o.  Admit date: 06/19/2022 Discharge date: 06/21/2022  Admission Diagnoses: IUFD at 26 3/7wks  Discharge Diagnoses:  Principal Problem:   IUFD at 17 weeks or more of gestation   Discharged Condition: good  Hospital Course: PPD 1 s/p NSVD via breech extraction after being induced for IUFD.  Mildly anemic but overall doing well and on zoloft.  Precautions and recs reviewed and pt instructed to keep appt on 07-03-22.  Consults:  MFM  Significant Diagnostic Studies: full IUFD lab work up, mildly anemic and d-dimer elevated but coags normal.  Treatments: as above  Discharge Exam: Blood pressure (!) 105/56, pulse 98, temperature 97.9 F (36.6 C), temperature source Oral, resp. rate 17, weight 102.1 kg, last menstrual period 12/15/2021, SpO2 98 %. General appearance: alert and no distress Resp: clear to auscultation bilaterally Cardio: regular rate and rhythm GI: FF, mild discomfort Extremities: no calf tenderness Lochia appropriate  Disposition: Discharge disposition: 01-Home or Self Care        Allergies as of 06/21/2022       Reactions   Butorphanol Hives, Itching        Medication List     STOP taking these medications    aspirin EC 81 MG tablet       TAKE these medications    acetaminophen 325 MG tablet Commonly known as: TYLENOL Take 2 tablets (650 mg total) by mouth every 4 (four) hours as needed for mild pain or moderate pain (for pain scale < 4  OR  temperature  >/=  100.5 F).   albuterol 108 (90 Base) MCG/ACT inhaler Commonly known as: VENTOLIN HFA Inhale 2 puffs into the lungs every 4 (four) hours as needed for wheezing or shortness of breath.   Azelastine HCl 137 MCG/SPRAY Soln Place 1 spray into both nostrils daily.   CALCIUM CITRATE +D PO Take 1 capsule by mouth in the morning, at noon, in the evening, and at bedtime. 4 times a day    ferrous sulfate 325 (65 FE) MG EC tablet Take 1 tablet (325 mg total) by mouth every other day.   FUSION PO Take 1 capsule by mouth daily.   ipratropium 0.03 % nasal spray Commonly known as: ATROVENT Place 1 spray into both nostrils in the morning and at bedtime.   metoprolol tartrate 25 MG tablet Commonly known as: LOPRESSOR Take 0.5 tablets (12.5 mg total) by mouth 2 (two) times daily.   PRENATAL VITAMIN PO Take 1 tablet by mouth daily.   RABEprazole 20 MG tablet Commonly known as: ACIPHEX Take 20 mg by mouth daily.   sertraline 100 MG tablet Commonly known as: ZOLOFT Take 150 mg by mouth daily.   VITAMIN D PO Take 2,000 Units by mouth every morning.   ZyrTEC Allergy 10 MG Caps Generic drug: Cetirizine HCl Take by oral route.        Follow-up [facility]. Go on 07/03/2022.   Specialty: Obstetrics and Gynecology Why: Keep scheduled appointment on 07-03-22 Contact information: [address] [place] [address] [phone]                Signed: Delice Lesch 06/21/2022, 4:01 PM

## 2022-06-22 LAB — RUBELLA SCREEN: Rubella: 1.75 index (ref >0.99)

## 2022-06-22 LAB — TOXOPLASMA GONDII ANTIBODY, IGG: Toxoplasma IgG Ratio: <3 IU/mL (ref 0.0–7.1)

## 2022-06-22 LAB — CMV ANTIBODY, IGG (EIA): CMV Ab - IgG: 5 U/mL — ABNORMAL HIGH (ref 0.00–0.59)

## 2022-06-22 LAB — SURGICAL PATHOLOGY

## 2022-07-01 ENCOUNTER — Telehealth (HOSPITAL_COMMUNITY): Payer: Self-pay

## 2022-07-01 NOTE — Telephone Encounter (Addendum)
Patient reports feeling up and down emotionally. She states that yesterday was a hard day and cried a lot. Patient scored a 12 on EPDS. Will notify provider of EPDS score. RN told patient about Maternal Mental Health Resources ([place], [employer]). Also told patient about [facility] bereavement support group. Will e-mail these resources to patient as well.Patient states that she has an appointment on Monday with Sarah Saunders. Patient declines questions/concerns about her health and healing.  [name] [facility] 07/01/22,1555

## 2022-07-03 DIAGNOSIS — O364XX9 Maternal care for intrauterine death, other fetus: Secondary | ICD-10-CM | POA: Diagnosis not present

## 2022-07-04 DIAGNOSIS — F430 Acute stress reaction: Secondary | ICD-10-CM | POA: Diagnosis not present

## 2022-07-18 DIAGNOSIS — F430 Acute stress reaction: Secondary | ICD-10-CM | POA: Diagnosis not present

## 2022-07-24 DIAGNOSIS — E559 Vitamin D deficiency, unspecified: Secondary | ICD-10-CM | POA: Diagnosis not present

## 2022-07-28 DIAGNOSIS — Z0000 Encounter for general adult medical examination without abnormal findings: Secondary | ICD-10-CM | POA: Diagnosis not present

## 2022-07-28 DIAGNOSIS — I10 Essential (primary) hypertension: Secondary | ICD-10-CM | POA: Diagnosis not present

## 2022-07-28 DIAGNOSIS — E221 Hyperprolactinemia: Secondary | ICD-10-CM | POA: Diagnosis not present

## 2022-07-28 DIAGNOSIS — R7301 Impaired fasting glucose: Secondary | ICD-10-CM | POA: Diagnosis not present

## 2022-07-31 ENCOUNTER — Ambulatory Visit: Payer: BC Managed Care – PPO

## 2022-08-01 DIAGNOSIS — F430 Acute stress reaction: Secondary | ICD-10-CM | POA: Diagnosis not present

## 2022-08-02 DIAGNOSIS — R7301 Impaired fasting glucose: Secondary | ICD-10-CM | POA: Diagnosis not present

## 2022-08-02 DIAGNOSIS — E221 Hyperprolactinemia: Secondary | ICD-10-CM | POA: Diagnosis not present

## 2022-08-02 DIAGNOSIS — I10 Essential (primary) hypertension: Secondary | ICD-10-CM | POA: Diagnosis not present

## 2022-08-02 DIAGNOSIS — Z0000 Encounter for general adult medical examination without abnormal findings: Secondary | ICD-10-CM | POA: Diagnosis not present

## 2022-10-23 ENCOUNTER — Ambulatory Visit: Payer: Self-pay | Admitting: *Deleted

## 2022-10-23 NOTE — Patient Outreach (Signed)
  Care Coordination   10/23/2022 Name: Sarah Saunders MRN: [id number] DOB: 06/25/1976   Care Coordination Outreach Attempts:  An unsuccessful telephone outreach was attempted today to offer the patient information about available care coordination services as a benefit of their health plan.   Follow Up Plan:  Additional outreach attempts will be made to offer the patient care coordination information and services.   Encounter Outcome:  No Answer   Care Coordination Interventions:  No, not indicated    Eduard Clos, MSW, [place] Worker [facility] [phone]

## 2022-11-06 ENCOUNTER — Encounter: Payer: BC Managed Care – PPO | Admitting: *Deleted

## 2022-11-24 ENCOUNTER — Encounter: Payer: Self-pay | Admitting: Hematology

## 2022-12-01 ENCOUNTER — Encounter: Payer: Self-pay | Admitting: Cardiology

## 2022-12-01 NOTE — Progress Notes (Deleted)
Patient referred by Georgianne Fick, MD for PSVT  Subjective:   Sarah Saunders, female    DOB: 02-28-1976, 47 y.o.   MRN: [id number]   No chief complaint on file.    HPI  47 y.o. African-American female with history of paroxysmal supraventricular tachycardia, pregnant post embryo transfer IVF on 01/04/2022  Patient has not been taking metoprolol regularly. In spite of that, she has had only two perceived episodes of PSVT, one lasting for a few seconds, the other lasing for up to 4 min. Otherwise, she is doing well. She expects to have the baby in January 2023.   Initial consultation 01/2022: Patient works as a Patent examiner, is known to have paroxysmal supraventricular tachycardia for long time.  Recently, she has noticed increasing frequency and severity of symptoms.  Patient was seen in [place], ER on 01/13/2022.  Patient was at home, had chest got up from bed to go to the bathroom at 2:30 AM.  She started having rapid palpitations symptoms that did not improve after vagal maneuvers.  Episode lasted for about 20 minutes.  She called EMS.  SVT was noted on EKG, that resolved with 6 mg adenosine, see tracing below.  Patient has not had any recurrent episodes since then.  She was started on metoprolol tartrate in the ER which she is taking 25 mg once daily.  Patient used to drink 1 cup of Caffeine before, but is now drinking 1 cup of decaf coffee.  Of note, patient underwent embryo transfer for in vitro fertilization on 01/04/2022.  This is patient's second pregnancy with her first child being 53 years old.  IVF is managed by Dr. Assunta Curtis at [facility] in [place], Dr. Estanislado Pandy is her OB/GYN here in town.  From early trends, it appears that IVF is successful and patient is pregnant.    Current Outpatient Medications:    acetaminophen (TYLENOL) 325 MG tablet, Take 2 tablets (650 mg total) by mouth every 4 (four) hours as needed for mild pain or moderate pain (for pain scale < 4  OR  temperature   >/=  100.5 F)., Disp: 30 tablet, Rfl: 1   albuterol (PROVENTIL HFA;VENTOLIN HFA) 108 (90 BASE) MCG/ACT inhaler, Inhale 2 puffs into the lungs every 4 (four) hours as needed for wheezing or shortness of breath., Disp: , Rfl:    Azelastine HCl 137 MCG/SPRAY SOLN, Place 1 spray into both nostrils daily., Disp: , Rfl:    Calcium Citrate-Vitamin D (CALCIUM CITRATE +D PO), Take 1 capsule by mouth in the morning, at noon, in the evening, and at bedtime. 4 times a day, Disp: , Rfl:    Cetirizine HCl (ZYRTEC ALLERGY) 10 MG CAPS, Take by oral route., Disp: , Rfl:    Fe Fum-Fe Poly-Vit C-Lactobac (FUSION PO), Take 1 capsule by mouth daily., Disp: , Rfl:    ferrous sulfate 325 (65 FE) MG EC tablet, Take 1 tablet (325 mg total) by mouth every other day., Disp: 30 tablet, Rfl: 1   ipratropium (ATROVENT) 0.03 % nasal spray, Place 1 spray into both nostrils in the morning and at bedtime., Disp: , Rfl:    metoprolol tartrate (LOPRESSOR) 25 MG tablet, Take 0.5 tablets (12.5 mg total) by mouth 2 (two) times daily., Disp: 90 tablet, Rfl: 3   Prenatal Vit-Fe Fumarate-FA (PRENATAL VITAMIN PO), Take 1 tablet by mouth daily., Disp: , Rfl:    RABEprazole (ACIPHEX) 20 MG tablet, Take 20 mg by mouth daily. , Disp: , Rfl:  sertraline (ZOLOFT) 100 MG tablet, Take 150 mg by mouth daily. , Disp: , Rfl:    VITAMIN D PO, Take 2,000 Units by mouth every morning., Disp: , Rfl:   Cardiovascular and other pertinent studies:  Echocardiogram 02/10/2022:  Normal LV systolic function with visual EF 55-60%. Left ventricle cavity  is normal in size. Normal left ventricular wall thickness. Normal global  wall motion. Normal diastolic filling pattern, normal LAP.  Mild tricuspid regurgitation. No evidence of pulmonary hypertension.  No prior study for comparison.  Telemetry 01/13/2022 (EMS):    EKG 01/20/2022: Sinus rhythm 74 bpm Normal EKG   Recent labs: 01/13/2022: Glucose 65, BUN/Cr 15/0.80. EGFR NA. Na/K 140/3.7.  H/H  10.7/34. MCV 82. Platelets 253  Latest Reference Range & Units 01/13/22 03:20 01/13/22 05:18  Troponin I (High Sensitivity) <18 ng/L 9 31 (H)  (H): Data is abnormally high    Review of Systems  Cardiovascular:  Negative for chest pain, dyspnea on exertion, leg swelling, palpitations and syncope.         There were no vitals filed for this visit.    There is no height or weight on file to calculate BMI. There were no vitals filed for this visit.    Objective:   Physical Exam Vitals and nursing note reviewed.  Constitutional:      General: She is not in acute distress. Neck:     Vascular: No JVD.  Cardiovascular:     Rate and Rhythm: Normal rate and regular rhythm.     Heart sounds: Normal heart sounds. No murmur heard. Pulmonary:     Effort: Pulmonary effort is normal.     Breath sounds: Normal breath sounds. No wheezing or rales.  Musculoskeletal:     Right lower leg: No edema.     Left lower leg: No edema.          Assessment & Recommendations:    47 y.o. African-American female with history of paroxysmal supraventricular tachycardia, pregnant post embryo transfer IVF on 01/04/2022  *** PSVT: Infrequent episodes even without regular use of metoprolol. She does not want to take metoprolol everyday due to low blood pressure. Okay to take it as needed. Will monitor on Cardiologs. In future, she would like to consider ablation, if needed.  *** F/u in 6 months    Elder Negus, MD Pager: [phone] Office: [phone]

## 2022-12-25 IMAGING — MR MR HEAD WO/W CM
15 of 19 series · 33 of 48 positions shown · IV contrast (10 ml Multihance)
Comparison: 06/03/2010

CLINICAL DATA: History of pituitary tumor since 5788. History of
migraines, increasing.

EXAM:
MRI HEAD WITHOUT AND WITH CONTRAST
TECHNIQUE: Multiplanar, multiecho pulse sequences of the brain and surrounding
structures were obtained without and with intravenous contrast.
CONTRAST:  10mL MULTIHANCE GADOBENATE DIMEGLUMINE 529 MG/ML IV SOLN

[Series 2: T1 · sagittal · 5.0mm · 0.45mm/px · 1 of 19 slices shown]
[im 1/19]
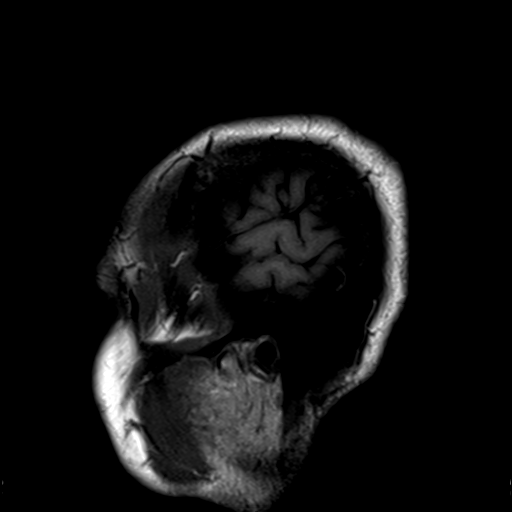

[Series 3: DWI · axial · 3.0mm · 1.80mm/px · z∈[-54,+93]mm · 8 of 100 slices shown]
[im 1/100]
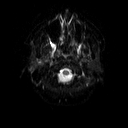
[im 12/100]
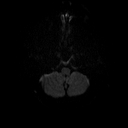
[im 34/100]
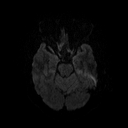
[im 45/100]
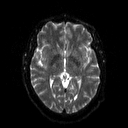
[im 56/100]
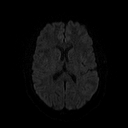
[im 67/100]
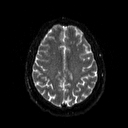
[im 89/100]
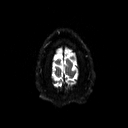
[im 100/100]
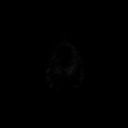

[Series 4: dwi_adc · axial · 3.0mm · 1.80mm/px · z∈[-54,+93]mm · 4 of 49 slices shown]
[im 1/49]
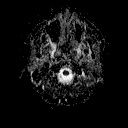
[im 17/49]
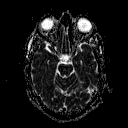
[im 33/49]
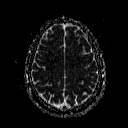
[im 49/49]
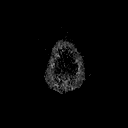

[Series 5: T2 · axial · 5.0mm · 0.36mm/px · z∈[-58,+98]mm · 3 of 25 slices shown]
[im 1/25]
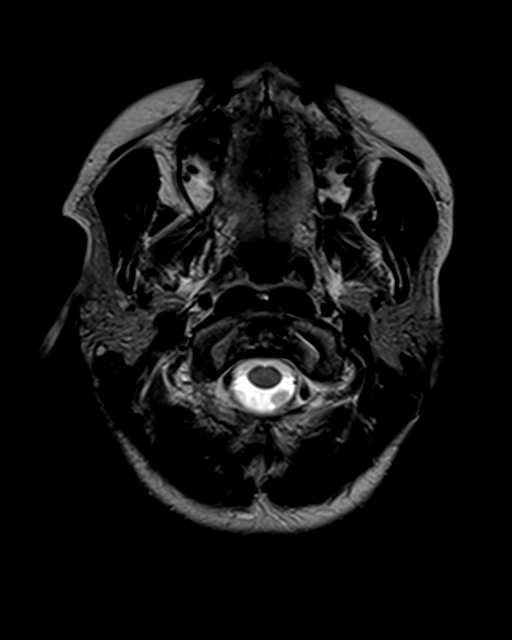
[im 13/25]
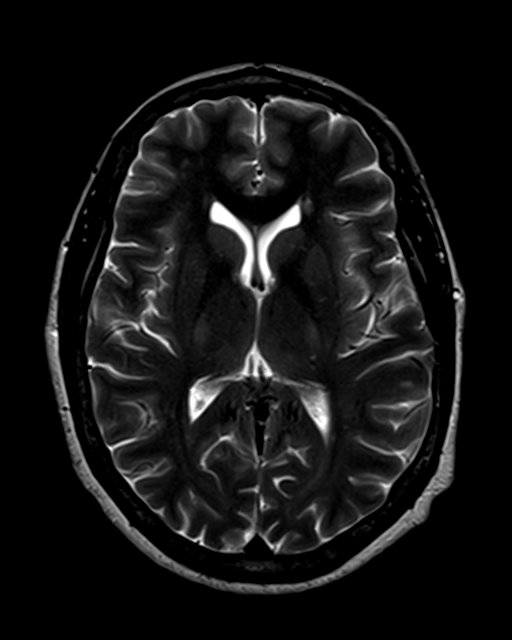
[im 25/25]
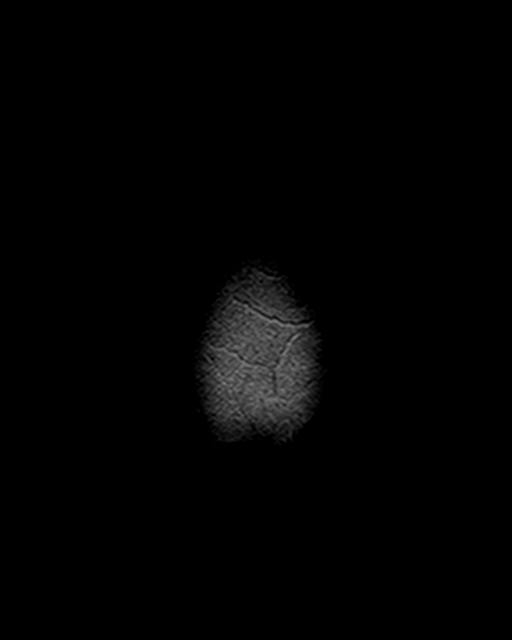

[Series 6: FLAIR · axial · 3.0mm · 0.45mm/px · z∈[-57,+96]mm · 4 of 34 slices shown]
[im 1/34]
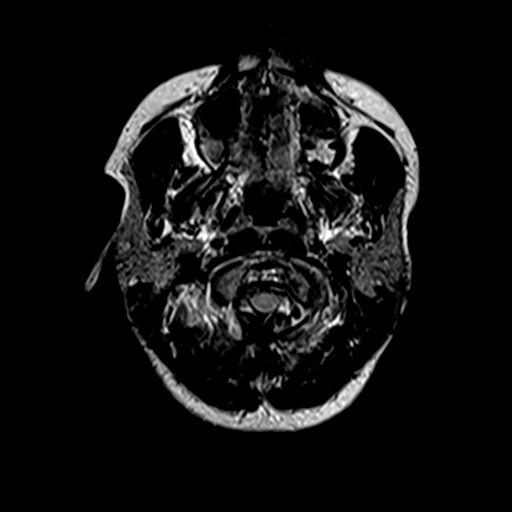
[im 12/34]
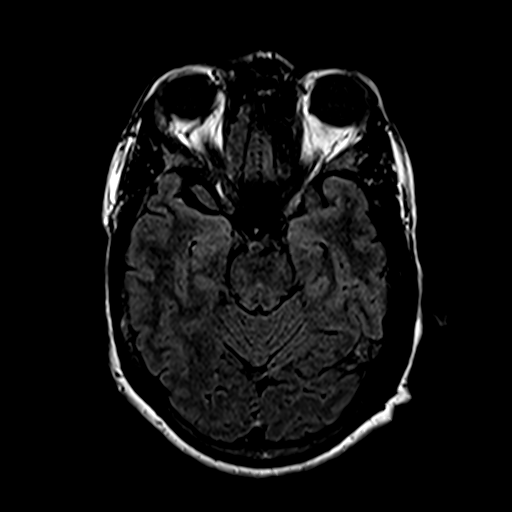
[im 23/34]
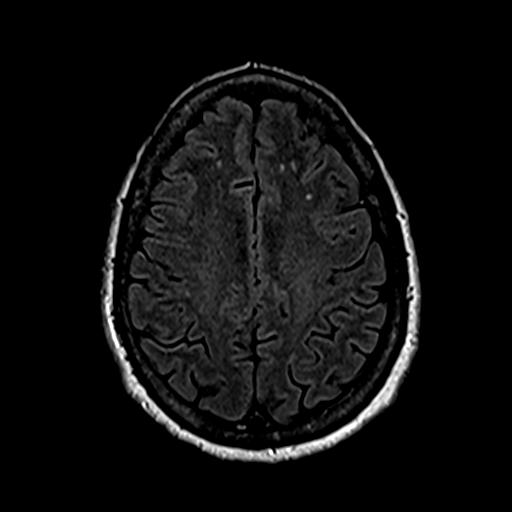
[im 34/34]
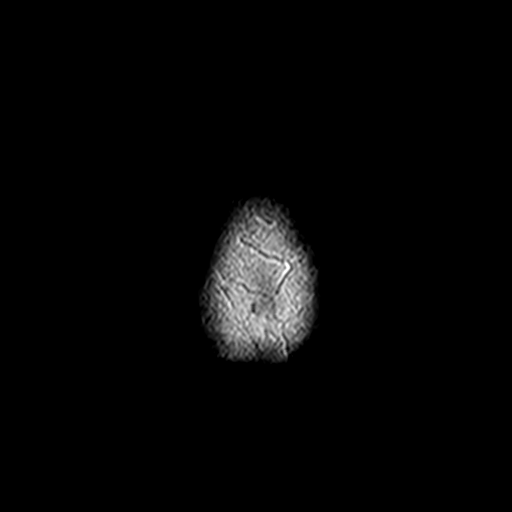

[Series 8: swi_images · axial · 4.0mm · 0.94mm/px · z∈[-64,+76]mm · 4 of 36 slices shown]
[im 1/36]
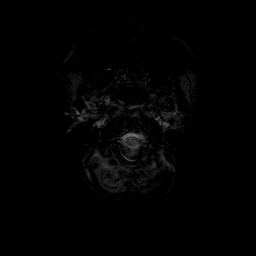
[im 12/36]
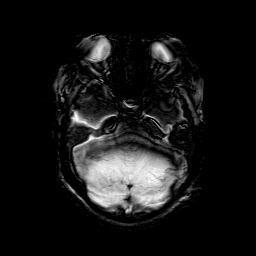
[im 24/36]
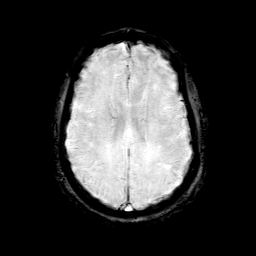
[im 36/36]
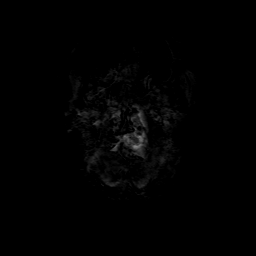

[Series 9: sag 3mm · sagittal · 3.0mm · 0.33mm/px · 1 of 11 slices shown]
[im 1/11]
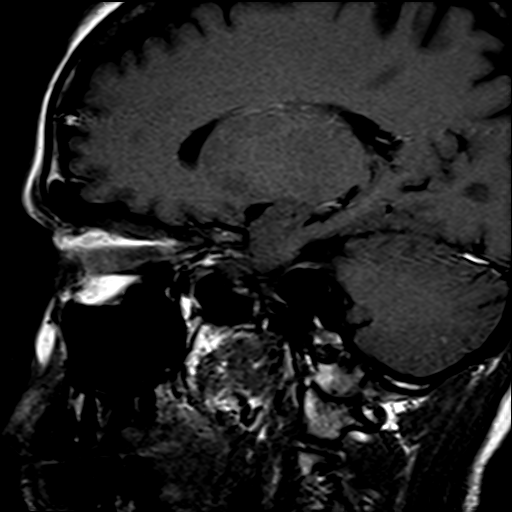

[Series 10: cor 3mm · coronal · 3.0mm · 0.33mm/px · 1 of 11 slices shown]
[im 1/11]
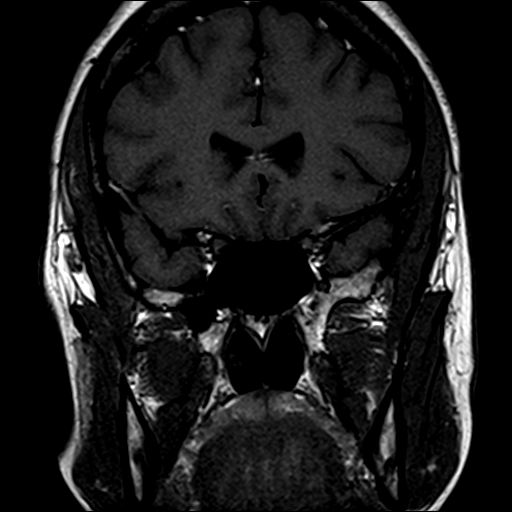

[Series 11: pre cor dynamic · coronal · non-contrast · 3.0mm · 0.35mm/px · 1 of 7 slices shown]
[im 1/7]
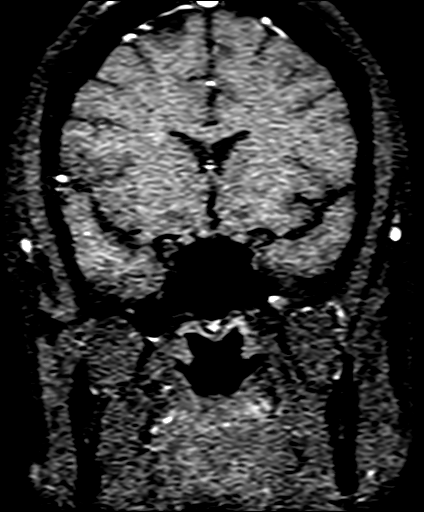

[Series 12: post fs cor · coronal · 3.0mm · 0.35mm/px · 1 of 7 slices shown (1 of 6)]
[im 1/7]
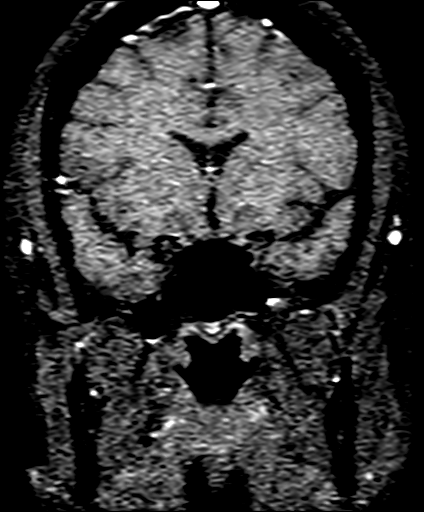

[Series 13: post fs cor · coronal · 3.0mm · 0.35mm/px · 1 of 7 slices shown (2 of 6)]
[im 1/7]
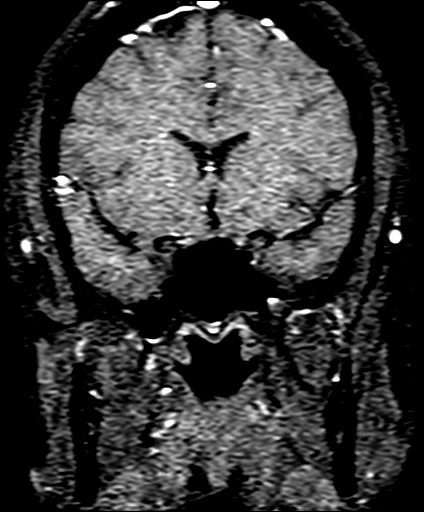

[Series 14: post fs cor · coronal · 3.0mm · 0.35mm/px · 1 of 7 slices shown (3 of 6)]
[im 1/7]
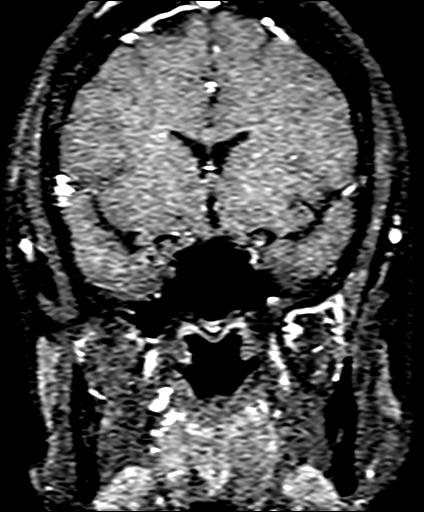

[Series 15: post fs cor · coronal · 3.0mm · 0.35mm/px · 1 of 7 slices shown (4 of 6)]
[im 1/7]
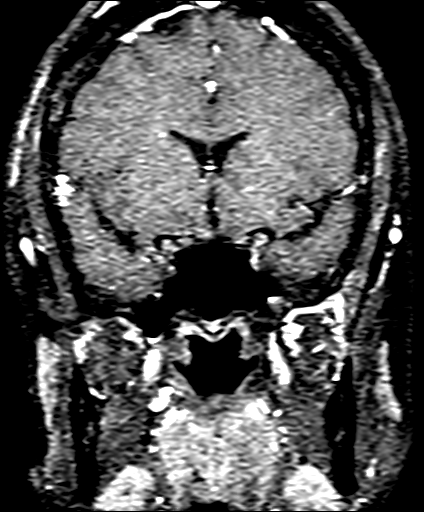

[Series 16: post fs cor · coronal · 3.0mm · 0.35mm/px · 1 of 7 slices shown (5 of 6)]
[im 1/7]
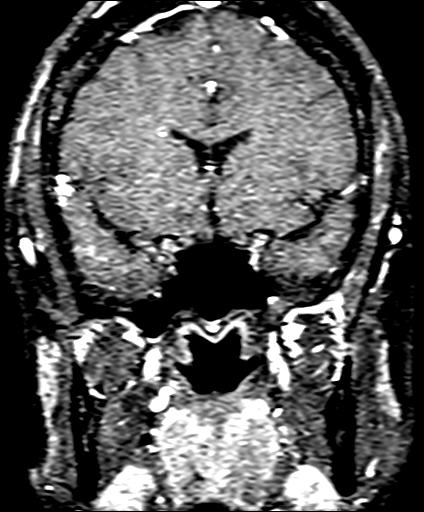

[Series 17: post fs cor · coronal · 3.0mm · 0.35mm/px · 1 of 7 slices shown (6 of 6)]
[im 1/7]
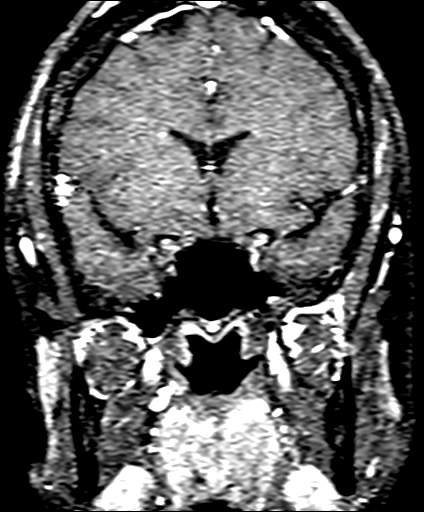

[33 of 48 positions shown; findings below may reference images not displayed]

FINDINGS: Brain: Chronically expanded sella with lining of pituitary tissue. A
right inferior pituitary mass on prior study is no longer seen.
Clear cavernous sinuses and suprasellar cistern.

Small FLAIR hyperintensities in the cerebral white matter to a mild
degree, essentially stable from 5788. These do not have a specific
demyelinating pattern and may relate to history of migraine. No
abnormal intracranial enhancement. No mass, gray matter infarct,
hydrocephalus, or collection.

Vascular: Normal flow voids.

Skull and upper cervical spine: Normal marrow signal.

Sinuses/Orbits: Negative.
IMPRESSION: 1. A pituitary adenoma noted in 5788 is no longer seen.
2. Remote nonspecific white matter insult without progression from

## 2023-04-23 ENCOUNTER — Inpatient Hospital Stay (HOSPITAL_COMMUNITY): Payer: BC Managed Care – PPO

## 2023-04-23 ENCOUNTER — Encounter: Payer: Self-pay | Admitting: Hematology

## 2023-04-23 ENCOUNTER — Inpatient Hospital Stay (HOSPITAL_COMMUNITY)
Admission: AD | Admit: 2023-04-23 | Discharge: 2023-04-23 | Disposition: A | Discharge status: Home / Self Care | Payer: BC Managed Care – PPO | Source: Home / Self Care | Attending: Obstetrics and Gynecology | Admitting: Obstetrics and Gynecology

## 2023-04-23 DIAGNOSIS — O26891 Other specified pregnancy related conditions, first trimester: Secondary | ICD-10-CM

## 2023-04-23 DIAGNOSIS — R252 Cramp and spasm: Secondary | ICD-10-CM | POA: Diagnosis not present

## 2023-04-23 DIAGNOSIS — O4691 Antepartum hemorrhage, unspecified, first trimester: Secondary | ICD-10-CM | POA: Diagnosis present

## 2023-04-23 DIAGNOSIS — O09811 Supervision of pregnancy resulting from assisted reproductive technology, first trimester: Secondary | ICD-10-CM | POA: Insufficient documentation

## 2023-04-23 DIAGNOSIS — O209 Hemorrhage in early pregnancy, unspecified: Secondary | ICD-10-CM

## 2023-04-23 DIAGNOSIS — R109 Unspecified abdominal pain: Secondary | ICD-10-CM | POA: Diagnosis not present

## 2023-04-23 DIAGNOSIS — Z3A01 Less than 8 weeks gestation of pregnancy: Secondary | ICD-10-CM

## 2023-04-23 DIAGNOSIS — O26899 Other specified pregnancy related conditions, unspecified trimester: Secondary | ICD-10-CM

## 2023-04-23 LAB — POCT PREGNANCY, URINE: Preg Test, Ur: POSITIVE — AB

## 2023-04-23 LAB — URINALYSIS, ROUTINE W REFLEX MICROSCOPIC
Bilirubin Urine: NEGATIVE
Glucose, UA: NEGATIVE mg/dL
Ketones, ur: NEGATIVE mg/dL
Leukocytes,Ua: NEGATIVE
Nitrite: NEGATIVE
Protein, ur: NEGATIVE mg/dL
RBC / HPF: >50 RBC/hpf (ref 0–5)
Specific Gravity, Urine: 1.018 (ref 1.005–1.030)
pH: 6 (ref 5.0–8.0)

## 2023-04-23 LAB — HCG, QUANTITATIVE, PREGNANCY: hCG, Beta Chain, Quant, S: 4601 m[IU]/mL — ABNORMAL HIGH (ref <5)

## 2023-04-23 NOTE — MAU Provider Note (Cosign Needed)
None     Chief Complaint:  Vaginal Bleeding   Sarah Saunders is  47 y.o. [id number] at Unknown presents complaining of Vaginal Bleeding .    She had embryo transferred on 8-14 Had post HCG on 8-28-  ( 534)  And on 8-30 ( 1192 ) . VB started at 6pm- thought it was vag D/C - then at 7 pm- panty liner was saturated X 1, but scant now.  Feels mild cramps ( 2)   RN note:  Pt says she sees Dr Assunta Curtis She had embryo transferred on 8-14 Had post HCG on 8-28-  ( 534)  And on 8-30 ( 1192 )  Suppose to have 1 drawn tomorrow . VB started at 6pm- thought it was vag D/C - then at 7 pm- pad was saturated .  Feels mild cramps ( 2)  In Triage - none    Obstetrical/Gynecological History: OB History     Gravida  4   Para  2   Term  1   Preterm  1   AB  2   Living  1      SAB  1   IAB      Ectopic  1   Multiple  0   Live Births             Past Medical History: Past Medical History:  Diagnosis Date   Anxiety    Morbid obesity (HCC) 10/09/2016   Pituitary tumor    Polycystic ovary disease    Prolactinoma (HCC)     Past Surgical History: Past Surgical History:  Procedure Laterality Date   JOINT REPLACEMENT Left 01/13/2021   hip   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)     TUBAL LIGATION     right fallopian tube removed   tube removed      Family History: Family History  Problem Relation Age of Onset   Hypertension Father    Heart failure Father    Hypertension Mother    Heart failure Brother     Social History: Social History   Tobacco Use   Smoking status: Never   Smokeless tobacco: Never  Vaping Use   Vaping status: Never Used  Substance Use Topics   Alcohol use: No   Drug use: No    Allergies:  Allergies  Allergen Reactions   Butorphanol Hives and Itching    Meds:  Medications Prior to Admission  Medication Sig Dispense Refill Last Dose   acetaminophen (TYLENOL) 325 MG tablet Take 2 tablets (650 mg  total) by mouth every 4 (four) hours as needed for mild pain or moderate pain (for pain scale < 4  OR  temperature  >/=  100.5 F). 30 tablet 1    albuterol (PROVENTIL HFA;VENTOLIN HFA) 108 (90 BASE) MCG/ACT inhaler Inhale 2 puffs into the lungs every 4 (four) hours as needed for wheezing or shortness of breath.      Azelastine HCl 137 MCG/SPRAY SOLN Place 1 spray into both nostrils daily.      Calcium Citrate-Vitamin D (CALCIUM CITRATE +D PO) Take 1 capsule by mouth in the morning, at noon, in the evening, and at bedtime. 4 times a day      Cetirizine HCl (ZYRTEC ALLERGY) 10 MG CAPS Take by oral route.      Fe Fum-Fe Poly-Vit C-Lactobac (FUSION PO) Take 1 capsule by mouth daily.      ferrous sulfate 325 (65 FE) MG EC tablet Take 1 tablet (325  mg total) by mouth every other day. 30 tablet 1    ipratropium (ATROVENT) 0.03 % nasal spray Place 1 spray into both nostrils in the morning and at bedtime.      metoprolol tartrate (LOPRESSOR) 25 MG tablet Take 0.5 tablets (12.5 mg total) by mouth 2 (two) times daily. 90 tablet 3    Prenatal Vit-Fe Fumarate-FA (PRENATAL VITAMIN PO) Take 1 tablet by mouth daily.      RABEprazole (ACIPHEX) 20 MG tablet Take 20 mg by mouth daily.       sertraline (ZOLOFT) 100 MG tablet Take 150 mg by mouth daily.       VITAMIN D PO Take 2,000 Units by mouth every morning.       Review of Systems   Constitutional: Negative for fever and chills Eyes: Negative for visual disturbances Respiratory: Negative for shortness of breath, dyspnea Cardiovascular: Negative for chest pain or palpitations  Gastrointestinal: Negative for vomiting, diarrhea and constipation Genitourinary: Negative for dysuria and urgency Musculoskeletal: Negative for back pain, joint pain, myalgias.  Normal ROM  Neurological: Negative for dizziness and headaches    Physical Exam  Blood pressure (!) 148/87, pulse 82, temperature 98 F (36.7 C), temperature source Oral, resp. rate 16, height 5' 7.5"  (1.715 m), weight 108.4 kg. GENERAL: Well-developed, well-nourished female in no acute distress.  LUNGS: Normal respiratory effort HEART: Regular rate and rhythm. ABDOMEN: Soft, nontender, EXTREMITIES: Nontender, no edema, 2+ distal pulses. DTR's 2+   Labs: Results for orders placed or performed during the hospital encounter of 04/23/23 (from the past 24 hour(s))  Pregnancy, urine POC     Status: Abnormal   Collection Time: 04/23/23  8:45 PM  Result Value Ref Range   Preg Test, Ur POSITIVE (A) NEGATIVE  Urinalysis, Routine w reflex microscopic -     Status: Abnormal   Collection Time: 04/23/23  8:46 PM  Result Value Ref Range   Color, Urine YELLOW YELLOW   APPearance HAZY (A) CLEAR   Specific Gravity, Urine 1.018 1.005 - 1.030   pH 6.0 5.0 - 8.0   Glucose, UA NEGATIVE NEGATIVE mg/dL   Hgb urine dipstick LARGE (A) NEGATIVE   Bilirubin Urine NEGATIVE NEGATIVE   Ketones, ur NEGATIVE NEGATIVE mg/dL   Protein, ur NEGATIVE NEGATIVE mg/dL   Nitrite NEGATIVE NEGATIVE   Leukocytes,Ua NEGATIVE NEGATIVE   RBC / HPF >50 0 - 5 RBC/hpf   WBC, UA 0-5 0 - 5 WBC/hpf   Bacteria, UA RARE (A) NONE SEEN   Squamous Epithelial / HPF 0-5 0 - 5 /HPF   Mucus PRESENT   hCG, quantitative, pregnancy     Status: Abnormal   Collection Time: 04/23/23  9:26 PM  Result Value Ref Range   hCG, Beta Chain, Quant, S 4,601 (H) <5 mIU/mL    Imaging Studies:  US OB Transvaginal  Result Date: 04/23/2023 CLINICAL DATA:  Vaginal bleeding after embryo transfer. EXAM: TRANSVAGINAL OB ULTRASOUND TECHNIQUE: Transvaginal ultrasound was performed for complete evaluation of the gestation as well as the maternal uterus, adnexal regions, and pelvic cul-de-sac. COMPARISON:  None Available. FINDINGS: Intrauterine gestational sac: Single Yolk sac:  Visualized. Embryo:  Not Visualized. Cardiac Activity: Not Visualized. Heart Rate: Not applicable MSD: 7.7 mm   5 w   4 d Subchorionic hemorrhage:  None visualized. Maternal  uterus/adnexae: Unremarkable ovaries. Trace free fluid in the pelvis. IMPRESSION: Early intrauterine gestational sac and yolk sac. No fetal pole or cardiac activity yet visualized. Recommend follow-up quantitative B-HCG levels and follow-up  US in 14 days to assess viability. This recommendation follows SRU consensus guidelines: Diagnostic Criteria for Nonviable Pregnancy Early in the First Trimester. Malva Limes Med 2013; 284:1324-40. Electronically Signed   By: Minerva Fester M.D.   On: 04/23/2023 21:50      Care turned over to [facility], CNM  Reviewed findings.  HCG level more than doubled US showed GS with YS Recommend reassessment in a week or so Patient will contact her MD in am to update them  Assessment: Sarah Saunders is  48 y.o. [id number] at early GA Bleeding Cramping  Plan: Discharge home Pelvic rest Followup with OB MD Encouraged to return if she develops worsening of symptoms, increase in pain, fever, or other concerning symptoms.   Aviva Signs, CNM   Jacklyn Shell 9/2/20249:14 PM

## 2023-04-23 NOTE — MAU Provider Note (Incomplete Revision)
None     Chief Complaint:  Vaginal Bleeding   YAZAIRA GAMBONE is  47 y.o. [id number] at Unknown presents complaining of Vaginal Bleeding .    She had embryo transferred on 8-14 Had post HCG on 8-28-  ( 534)  And on 8-30 ( 1192 ) . VB started at 6pm- thought it was vag D/C - then at 7 pm- panty liner was saturated X 1, but scant now.  Feels mild cramps ( 2)   Obstetrical/Gynecological History: OB History     Gravida  4   Para  2   Term  1   Preterm  1   AB  2   Living  1      SAB  1   IAB      Ectopic  1   Multiple  0   Live Births             Past Medical History: Past Medical History:  Diagnosis Date   Anxiety    Morbid obesity (HCC) 10/09/2016   Pituitary tumor    Polycystic ovary disease    Prolactinoma (HCC)     Past Surgical History: Past Surgical History:  Procedure Laterality Date   JOINT REPLACEMENT Left 01/13/2021   hip   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)     TUBAL LIGATION     right fallopian tube removed   tube removed      Family History: Family History  Problem Relation Age of Onset   Hypertension Father    Heart failure Father    Hypertension Mother    Heart failure Brother     Social History: Social History   Tobacco Use   Smoking status: Never   Smokeless tobacco: Never  Vaping Use   Vaping status: Never Used  Substance Use Topics   Alcohol use: No   Drug use: No    Allergies:  Allergies  Allergen Reactions   Butorphanol Hives and Itching    Meds:  Medications Prior to Admission  Medication Sig Dispense Refill Last Dose   acetaminophen (TYLENOL) 325 MG tablet Take 2 tablets (650 mg total) by mouth every 4 (four) hours as needed for mild pain or moderate pain (for pain scale < 4  OR  temperature  >/=  100.5 F). 30 tablet 1    albuterol (PROVENTIL HFA;VENTOLIN HFA) 108 (90 BASE) MCG/ACT inhaler Inhale 2 puffs into the lungs every 4 (four) hours as needed for wheezing or  shortness of breath.      Azelastine HCl 137 MCG/SPRAY SOLN Place 1 spray into both nostrils daily.      Calcium Citrate-Vitamin D (CALCIUM CITRATE +D PO) Take 1 capsule by mouth in the morning, at noon, in the evening, and at bedtime. 4 times a day      Cetirizine HCl (ZYRTEC ALLERGY) 10 MG CAPS Take by oral route.      Fe Fum-Fe Poly-Vit C-Lactobac (FUSION PO) Take 1 capsule by mouth daily.      ferrous sulfate 325 (65 FE) MG EC tablet Take 1 tablet (325 mg total) by mouth every other day. 30 tablet 1    ipratropium (ATROVENT) 0.03 % nasal spray Place 1 spray into both nostrils in the morning and at bedtime.      metoprolol tartrate (LOPRESSOR) 25 MG tablet Take 0.5 tablets (12.5 mg total) by mouth 2 (two) times daily. 90 tablet 3    Prenatal Vit-Fe Fumarate-FA (PRENATAL VITAMIN PO) Take 1 tablet by  mouth daily.      RABEprazole (ACIPHEX) 20 MG tablet Take 20 mg by mouth daily.       sertraline (ZOLOFT) 100 MG tablet Take 150 mg by mouth daily.       VITAMIN D PO Take 2,000 Units by mouth every morning.       Review of Systems   Constitutional: Negative for fever and chills Eyes: Negative for visual disturbances Respiratory: Negative for shortness of breath, dyspnea Cardiovascular: Negative for chest pain or palpitations  Gastrointestinal: Negative for vomiting, diarrhea and constipation Genitourinary: Negative for dysuria and urgency Musculoskeletal: Negative for back pain, joint pain, myalgias.  Normal ROM  Neurological: Negative for dizziness and headaches    Physical Exam  Blood pressure (!) 148/87, pulse 82, temperature 98 F (36.7 C), temperature source Oral, resp. rate 16, height 5' 7.5" (1.715 m), weight 108.4 kg. GENERAL: Well-developed, well-nourished female in no acute distress.  LUNGS: Normal respiratory effort HEART: Regular rate and rhythm. ABDOMEN: Soft, nontender, EXTREMITIES: Nontender, no edema, 2+ distal pulses. DTR's 2+   Labs: Results for orders placed or  performed during the hospital encounter of 04/23/23 (from the past 24 hour(s))  Pregnancy, urine POC   Collection Time: 04/23/23  8:45 PM  Result Value Ref Range   Preg Test, Ur POSITIVE (A) NEGATIVE  Urinalysis, Routine w reflex microscopic -   Collection Time: 04/23/23  8:46 PM  Result Value Ref Range   Color, Urine YELLOW YELLOW   APPearance HAZY (A) CLEAR   Specific Gravity, Urine 1.018 1.005 - 1.030   pH 6.0 5.0 - 8.0   Glucose, UA NEGATIVE NEGATIVE mg/dL   Hgb urine dipstick LARGE (A) NEGATIVE   Bilirubin Urine NEGATIVE NEGATIVE   Ketones, ur NEGATIVE NEGATIVE mg/dL   Protein, ur NEGATIVE NEGATIVE mg/dL   Nitrite NEGATIVE NEGATIVE   Leukocytes,Ua NEGATIVE NEGATIVE   RBC / HPF >50 0 - 5 RBC/hpf   WBC, UA 0-5 0 - 5 WBC/hpf   Bacteria, UA RARE (A) NONE SEEN   Squamous Epithelial / HPF 0-5 0 - 5 /HPF   Mucus PRESENT    Imaging Studies:  No results found.   Care turned over to [facility], CNM Assessment: LAESHA MUMPOWER is  47 y.o. [id number] at  Plan:   Jacklyn Shell 9/2/20249:14 PM

## 2023-04-23 NOTE — MAU Note (Signed)
Pt says she sees Dr Assunta Curtis She had embryo transferred on 8-14 Had post HCG on 8-28-  ( 534)  And on 8-30 ( 1192 )  Suppose to have 1 drawn tomorrow . VB started at 6pm- thought it was vag D/C - then at 7 pm- pad was saturated .  Feels mild cramps ( 2)  In Triage - none

## 2023-04-26 NOTE — Progress Notes (Signed)
This encounter was created in error - please disregard.

## 2023-05-07 ENCOUNTER — Inpatient Hospital Stay (HOSPITAL_COMMUNITY)
Admission: AD | Admit: 2023-05-07 | Discharge: 2023-05-07 | Disposition: A | Discharge status: Home / Self Care | Payer: BC Managed Care – PPO | Attending: Obstetrics & Gynecology | Admitting: Obstetrics & Gynecology

## 2023-05-07 ENCOUNTER — Encounter (HOSPITAL_COMMUNITY): Payer: Self-pay | Admitting: *Deleted

## 2023-05-07 DIAGNOSIS — N939 Abnormal uterine and vaginal bleeding, unspecified: Secondary | ICD-10-CM

## 2023-05-07 DIAGNOSIS — O208 Other hemorrhage in early pregnancy: Secondary | ICD-10-CM | POA: Diagnosis not present

## 2023-05-07 DIAGNOSIS — Z3A01 Less than 8 weeks gestation of pregnancy: Secondary | ICD-10-CM

## 2023-05-07 DIAGNOSIS — M79604 Pain in right leg: Secondary | ICD-10-CM | POA: Diagnosis present

## 2023-05-07 DIAGNOSIS — O200 Threatened abortion: Secondary | ICD-10-CM | POA: Diagnosis not present

## 2023-05-07 DIAGNOSIS — R252 Cramp and spasm: Secondary | ICD-10-CM | POA: Diagnosis present

## 2023-05-07 DIAGNOSIS — M79605 Pain in left leg: Secondary | ICD-10-CM | POA: Diagnosis present

## 2023-05-07 DIAGNOSIS — O09811 Supervision of pregnancy resulting from assisted reproductive technology, first trimester: Secondary | ICD-10-CM | POA: Diagnosis not present

## 2023-05-07 LAB — CBC WITH DIFFERENTIAL/PLATELET
Abs Immature Granulocytes: 0.03 10*3/uL (ref 0.00–0.07)
Basophils Absolute: 0 10*3/uL (ref 0.0–0.1)
Basophils Relative: 1 %
Eosinophils Absolute: 0.1 10*3/uL (ref 0.0–0.5)
Eosinophils Relative: 1 %
HCT: 31.7 % — ABNORMAL LOW (ref 36.0–46.0)
Hemoglobin: 9.9 g/dL — ABNORMAL LOW (ref 12.0–15.0)
Immature Granulocytes: 0 %
Lymphocytes Relative: 25 %
Lymphs Abs: 2 10*3/uL (ref 0.7–4.0)
MCH: 25.6 pg — ABNORMAL LOW (ref 26.0–34.0)
MCHC: 31.2 g/dL (ref 30.0–36.0)
MCV: 82.1 fL (ref 80.0–100.0)
Monocytes Absolute: 0.5 10*3/uL (ref 0.1–1.0)
Monocytes Relative: 6 %
Neutro Abs: 5.2 10*3/uL (ref 1.7–7.7)
Neutrophils Relative %: 67 %
Platelets: 270 10*3/uL (ref 150–400)
RBC: 3.86 MIL/uL — ABNORMAL LOW (ref 3.87–5.11)
RDW: 16.9 % — ABNORMAL HIGH (ref 11.5–15.5)
WBC: 7.8 10*3/uL (ref 4.0–10.5)
nRBC: 0 % (ref 0.0–0.2)

## 2023-05-07 MED ORDER — ACETAMINOPHEN 500 MG PO TABS: 1000.0000 mg | ORAL_TABLET | Freq: Once | ORAL | Status: DC

## 2023-05-07 NOTE — MAU Provider Note (Signed)
History     CSN: [id number]  Arrival date and time: 05/07/23 0254   Event Date/Time   First Provider Initiated Contact with Patient 05/07/23 0356      Chief Complaint  Patient presents with   Abdominal Pain   Vaginal Bleeding   Sarah Saunders , a  47 y.o. [id number] at [redacted]w[redacted]d presents to MAU with complaints of vaginal bleeding. Patient states that starting around 0230am she noted that she felt "wet." She reports going to the bathroom and noted bright red vaginal bleeding and passing clots. She states that the first clot was about the the size of a dime and the second was about the size of a nickel. She reports saturating a panty liner since 230. She denies abnormal vaginal discharge, or urinary symptoms. She reports leg cramping around 1130pm but denies abdominal cramping. This is a IVF pregnancy and this patient is very concerned.    Patient reports having a known subchorionic hematoma.     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[facility])     Past Surgical History:  Procedure Laterality Date   JOINT REPLACEMENT Left 01/13/2021   hip   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)     TUBAL LIGATION     right fallopian tube removed   tube removed      Family History  Problem Relation Age of Onset   Hypertension Father    Heart failure Father    Hypertension Mother    Heart failure Brother     Social History   Tobacco Use   Smoking status: Never   Smokeless tobacco: Never  Vaping Use   Vaping status: Never Used  Substance Use Topics   Alcohol use: No   Drug use: No    Allergies:  Allergies  Allergen Reactions   Butorphanol Hives and Itching     Medications Prior to Admission  Medication Sig Dispense Refill Last Dose   acetaminophen (TYLENOL) 325 MG tablet Take 2 tablets (650 mg total) by mouth every 4 (four) hours as needed for mild pain or moderate pain (for pain scale < 4  OR  temperature  >/=  100.5 F). 30 tablet 1 Past Week   Azelastine HCl 137 MCG/SPRAY SOLN Place 1 spray into both nostrils daily.   Past Month   Cetirizine HCl (ZYRTEC ALLERGY) 10 MG CAPS Take by oral route.   05/06/2023   Fe Fum-Fe Poly-Vit C-Lactobac (FUSION PO) Take 1 capsule by mouth daily.   05/06/2023   ferrous sulfate 325 (65 FE) MG EC tablet Take 1 tablet (325 mg total) by mouth every other day. 30 tablet 1 05/06/2023   ipratropium (ATROVENT) 0.03 % nasal spray Place 1 spray into both nostrils in the morning and at bedtime.   Past Month   Prenatal Vit-Fe Fumarate-FA (PRENATAL VITAMIN PO) Take 1 tablet by mouth daily.   05/06/2023   RABEprazole (ACIPHEX) 20 MG tablet Take  20 mg by mouth daily.    05/06/2023   sertraline (ZOLOFT) 100 MG tablet Take 150 mg by mouth daily.    05/06/2023   albuterol (PROVENTIL HFA;VENTOLIN HFA) 108 (90 BASE) MCG/ACT inhaler Inhale 2 puffs into the lungs every 4 (four) hours as needed for wheezing or shortness of breath.   More than a month   Calcium Citrate-Vitamin D (CALCIUM CITRATE +D PO) Take 1 capsule by mouth in the morning, at noon, in the evening, and at bedtime. 4 times a day   More than a month   metoprolol tartrate (LOPRESSOR) 25 MG tablet Take 0.5 tablets (12.5 mg total) by mouth 2 (two) times daily. 90 tablet 3 More than a month   VITAMIN D PO Take 2,000 Units by mouth every morning.   More than a month    Review of Systems  Constitutional:  Negative for chills, fatigue and fever.  Eyes:  Negative for pain and visual disturbance.  Respiratory:  Negative for apnea, shortness of breath and wheezing.   Cardiovascular:  Negative for chest pain and palpitations.  Gastrointestinal:  Negative for abdominal pain,  constipation, diarrhea, nausea and vomiting.  Genitourinary:  Positive for vaginal bleeding and vaginal discharge. Negative for difficulty urinating, dysuria, pelvic pain and vaginal pain.  Musculoskeletal:  Positive for myalgias. Negative for back pain.  Neurological:  Negative for seizures, weakness and headaches.  Psychiatric/Behavioral:  Negative for suicidal ideas.    Physical Exam   Blood pressure 111/74, pulse 82, temperature 97.9 F (36.6 C), temperature source Oral, resp. rate 16, height 5' 7.5" (1.715 m), weight 109.3 kg.  Physical Exam Vitals and nursing note reviewed. Exam conducted with a chaperone present.  Constitutional:      General: She is not in acute distress.    Appearance: Normal appearance.  HENT:     Head: Normocephalic.  Pulmonary:     Effort: Pulmonary effort is normal.  Abdominal:     Tenderness: There is no abdominal tenderness.  Genitourinary:    Vagina: Bleeding present.     Comments: Bright red vaginal bleeding noted on exam.  Musculoskeletal:     Cervical back: Normal range of motion.  Skin:    General: Skin is warm and dry.  Neurological:     Mental Status: She is alert and oriented to person, place, and time.  Psychiatric:        Mood and Affect: Mood normal.     MAU Course  Procedures  Patient informed that the ultrasound is considered a limited OB ultrasound and is not intended to be a complete ultrasound exam.  Patient also informed that the ultrasound is not being completed with the intent of assessing for fetal or placental anomalies or any pelvic abnormalities.  Explained that the purpose of today's ultrasound is to assess for viability. Patient acknowledges the purpose of the exam and the limitations of the study.  Claudette Head, CNM  05/07/2023 4:21 AM   Fetal Cardiac flicker noted on Bedside US. Doppler was 160 bmp  MDM - Known SCH  - CBC ordered.  - Suspicion for Threatened Miscarriage.  - Single living IUP  - Hgb 9.9-  patient hemodynamically stable at this time.  - plan for discharge   Assessment and Plan   1. Vaginal bleeding   2. Subchorionic hemorrhage of placenta in first trimester   3. Threatened miscarriage in early pregnancy   4. [redacted] weeks gestation of pregnancy    - Reviewed bleeding expectations with a  Subchorionic hematoma. Recommended pelvic rest at least until appointment on Thursday with fertility clinic.  - Recommended for patient to be seen earlier if possible if her concerns worsen.  - Reviewed worsening signs and return precautions.  - Patient discharged home in stable condition and may return to MAU as needed .  Claudette Head, MSN CNM  05/07/2023, 3:56 AM

## 2023-05-07 NOTE — MAU Note (Signed)
Pt says she was here on 04-23-2023 [facility] labs and U/S. Then on Friday 13th- had vag U/S - all ok - good FHR- has subchronic hem At 2300- felt cramps in legs 3/10 Then tonight at 0230- woke - she felt wet - had small amt blood on liner . Used B-room- had 2 clots- size of nickel- dime- red .  Feels mild cramps- 2/10

## 2023-07-04 ENCOUNTER — Encounter (HOSPITAL_COMMUNITY): Payer: Self-pay | Admitting: Obstetrics and Gynecology

## 2023-07-04 ENCOUNTER — Inpatient Hospital Stay (HOSPITAL_COMMUNITY): Payer: BC Managed Care – PPO

## 2023-07-04 ENCOUNTER — Inpatient Hospital Stay (HOSPITAL_COMMUNITY)
Admission: AD | Admit: 2023-07-04 | Discharge: 2023-07-06 | DRG: 818 | Disposition: A | Discharge status: Home / Self Care | Payer: BC Managed Care – PPO | Attending: Obstetrics & Gynecology | Admitting: Obstetrics & Gynecology

## 2023-07-04 DIAGNOSIS — N1330 Unspecified hydronephrosis: Secondary | ICD-10-CM

## 2023-07-04 DIAGNOSIS — R109 Unspecified abdominal pain: Secondary | ICD-10-CM | POA: Diagnosis not present

## 2023-07-04 DIAGNOSIS — R319 Hematuria, unspecified: Secondary | ICD-10-CM

## 2023-07-04 DIAGNOSIS — E66813 Obesity, class 3: Secondary | ICD-10-CM | POA: Diagnosis present

## 2023-07-04 DIAGNOSIS — Z3A15 15 weeks gestation of pregnancy: Secondary | ICD-10-CM

## 2023-07-04 DIAGNOSIS — R112 Nausea with vomiting, unspecified: Secondary | ICD-10-CM

## 2023-07-04 DIAGNOSIS — Z885 Allergy status to narcotic agent status: Secondary | ICD-10-CM

## 2023-07-04 DIAGNOSIS — Z8249 Family history of ischemic heart disease and other diseases of the circulatory system: Secondary | ICD-10-CM

## 2023-07-04 DIAGNOSIS — O99891 Other specified diseases and conditions complicating pregnancy: Secondary | ICD-10-CM

## 2023-07-04 DIAGNOSIS — Z87442 Personal history of urinary calculi: Secondary | ICD-10-CM

## 2023-07-04 DIAGNOSIS — O26832 Pregnancy related renal disease, second trimester: Secondary | ICD-10-CM | POA: Diagnosis not present

## 2023-07-04 DIAGNOSIS — O99212 Obesity complicating pregnancy, second trimester: Secondary | ICD-10-CM | POA: Diagnosis present

## 2023-07-04 DIAGNOSIS — O26892 Other specified pregnancy related conditions, second trimester: Secondary | ICD-10-CM | POA: Diagnosis not present

## 2023-07-04 DIAGNOSIS — O99012 Anemia complicating pregnancy, second trimester: Secondary | ICD-10-CM | POA: Diagnosis present

## 2023-07-04 DIAGNOSIS — N132 Hydronephrosis with renal and ureteral calculous obstruction: Secondary | ICD-10-CM | POA: Diagnosis present

## 2023-07-04 DIAGNOSIS — Z79899 Other long term (current) drug therapy: Secondary | ICD-10-CM

## 2023-07-04 DIAGNOSIS — O09522 Supervision of elderly multigravida, second trimester: Secondary | ICD-10-CM

## 2023-07-04 LAB — URINALYSIS, ROUTINE W REFLEX MICROSCOPIC
Bilirubin Urine: NEGATIVE
Glucose, UA: NEGATIVE mg/dL
Ketones, ur: NEGATIVE mg/dL
Leukocytes,Ua: NEGATIVE
Nitrite: NEGATIVE
Protein, ur: NEGATIVE mg/dL
Specific Gravity, Urine: 1.002 — ABNORMAL LOW (ref 1.005–1.030)
pH: 6 (ref 5.0–8.0)

## 2023-07-04 LAB — COMPREHENSIVE METABOLIC PANEL
ALT: 50 U/L — ABNORMAL HIGH (ref 0–44)
AST: 23 U/L (ref 15–41)
Albumin: 2.8 g/dL — ABNORMAL LOW (ref 3.5–5.0)
Alkaline Phosphatase: 91 U/L (ref 38–126)
Anion gap: 10 (ref 5–15)
BUN: 16 mg/dL (ref 6–20)
CO2: 15 mmol/L — ABNORMAL LOW (ref 22–32)
Calcium: 8.9 mg/dL (ref 8.9–10.3)
Chloride: 105 mmol/L (ref 98–111)
Creatinine, Ser: 0.93 mg/dL (ref 0.44–1.00)
GFR, Estimated: >60 mL/min (ref >60)
Glucose, Bld: 87 mg/dL (ref 70–99)
Potassium: 3.5 mmol/L (ref 3.5–5.1)
Sodium: 130 mmol/L — ABNORMAL LOW (ref 135–145)
Total Bilirubin: 0.5 mg/dL (ref <1.2)
Total Protein: 6.7 g/dL (ref 6.5–8.1)

## 2023-07-04 LAB — CBC
HCT: 31.8 % — ABNORMAL LOW (ref 36.0–46.0)
Hemoglobin: 9.9 g/dL — ABNORMAL LOW (ref 12.0–15.0)
MCH: 24.9 pg — ABNORMAL LOW (ref 26.0–34.0)
MCHC: 31.1 g/dL (ref 30.0–36.0)
MCV: 80.1 fL (ref 80.0–100.0)
Platelets: 212 10*3/uL (ref 150–400)
RBC: 3.97 MIL/uL (ref 3.87–5.11)
RDW: 15.4 % (ref 11.5–15.5)
WBC: 9.4 10*3/uL (ref 4.0–10.5)
nRBC: 0 % (ref 0.0–0.2)

## 2023-07-04 MED ORDER — HYDROMORPHONE HCL 1 MG/ML IJ SOLN
1.0000 mg | Freq: Once | INTRAMUSCULAR | Status: AC
  Administered 2023-07-04: 1 mg via INTRAVENOUS
  Filled 2023-07-04: qty 1

## 2023-07-04 MED ORDER — ONDANSETRON HCL 4 MG/2ML IJ SOLN
4.0000 mg | Freq: Once | INTRAMUSCULAR | Status: AC
  Administered 2023-07-04: 4 mg via INTRAVENOUS
  Filled 2023-07-04: qty 2

## 2023-07-04 MED ORDER — SODIUM CHLORIDE 0.9 % IV SOLN: Freq: Once | INTRAVENOUS | Status: AC

## 2023-07-04 NOTE — MAU Note (Signed)
.  Sarah Saunders is a 47 y.o. at [redacted]w[redacted]d here in MAU reporting: right flank pain that started at around 2 pm today. Pain radiates to right lower abd. Reports she has a history of kidney stones and it feels like she is trying to pass a stone.  Onset of complaint: 2 pm Pain score: 10/10 Vitals:   07/04/23 2130  BP: 138/89  Pulse: 97  Resp: 17  SpO2: 100%      Lab orders placed from triage:

## 2023-07-04 NOTE — MAU Provider Note (Signed)
Chief Complaint:  Back Pain, Abdominal Pain, and Nausea   Event Date/Time   First Provider Initiated Contact with Patient 07/04/23 2149     HPI: Sarah Saunders is a 47 y.o. [id number] at 79w5dwho presents to maternity admissions reporting right flank pain since this afternoon.  Has a history of stones and had to  have a stent last time (2022).  Also has some nausea. . She denies LOF, vaginal bleeding, diarrhea, constipation or fever/chills.    Back Pain This is a new problem. The current episode started today. The problem occurs constantly. The problem has been gradually worsening since onset. The pain is at a severity of 10/10. The pain is severe. The pain is The same all the time. Associated symptoms include abdominal pain. Pertinent negatives include no dysuria or fever. Risk factors include pregnancy (history of stones). She has tried nothing for the symptoms.   RN Note: Sarah Saunders is a 47 y.o. at [redacted]w[redacted]d here in MAU reporting: right flank pain that started at around 2 pm today. Pain radiates to right lower abd. Reports she has a history of kidney stones and it feels like she is trying to pass a stone.  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 bone involvement  3 IAB      Biochemical     2 Ectopic           1 Term      Vag-Spont       Past Surgical History: Past Surgical History:  Procedure Laterality Date   JOINT REPLACEMENT Left 01/13/2021   hip   LAPAROSCOPIC GASTRIC  RESTRICTIVE DUODENAL PROCEDURE (DUODENAL SWITCH)     TUBAL LIGATION     right fallopian tube removed   tube removed      Family History: Family History  Problem Relation Age of Onset   Hypertension Father    Heart failure Father    Hypertension Mother    Heart failure Brother     Social History: Social History   Tobacco Use   Smoking status: Never   Smokeless tobacco: Never  Vaping Use   Vaping status: Never Used  Substance Use Topics   Alcohol use: No   Drug use: No    Allergies:  Allergies  Allergen Reactions   Butorphanol Hives and Itching    Meds:  Medications Prior to Admission  Medication Sig Dispense Refill Last Dose   acetaminophen (TYLENOL) 325 MG tablet Take 2 tablets (650 mg total) by mouth every 4 (four) hours as needed for mild pain or moderate pain (for pain scale < 4  OR  temperature  >/=  100.5 F). 30 tablet 1 07/04/2023 at 1400   Cetirizine HCl (ZYRTEC ALLERGY) 10 MG CAPS Take by oral route.   07/03/2023  ferrous sulfate 325 (65 FE) MG EC tablet Take 1 tablet (325 mg total) by mouth every other day. 30 tablet 1 07/03/2023   Prenatal Vit-Fe Fumarate-FA (PRENATAL VITAMIN PO) Take 1 tablet by mouth daily.   07/03/2023   RABEprazole (ACIPHEX) 20 MG tablet Take 20 mg by mouth daily.    07/03/2023   sertraline (ZOLOFT) 100 MG tablet Take 150 mg by mouth daily.    07/03/2023   albuterol (PROVENTIL HFA;VENTOLIN HFA) 108 (90 BASE) MCG/ACT inhaler Inhale 2 puffs into the lungs every 4 (four) hours as needed for wheezing or shortness of breath.      Azelastine HCl 137 MCG/SPRAY SOLN Place 1 spray into both nostrils daily.      Calcium Citrate-Vitamin D (CALCIUM CITRATE +D PO) Take 1 capsule by mouth in the morning, at noon, in the evening, and at bedtime. 4 times a day      Fe Fum-Fe Poly-Vit C-Lactobac (FUSION PO) Take 1 capsule by mouth daily.      ipratropium (ATROVENT) 0.03 % nasal spray Place 1 spray into both nostrils in the morning and at bedtime.       metoprolol tartrate (LOPRESSOR) 25 MG tablet Take 0.5 tablets (12.5 mg total) by mouth 2 (two) times daily. 90 tablet 3    VITAMIN D PO Take 2,000 Units by mouth every morning.   Unknown    I have reviewed patient's Past Medical Hx, Surgical Hx, Family Hx, Social Hx, medications and allergies.   ROS:  Review of Systems  Constitutional:  Negative for fever.  Gastrointestinal:  Positive for abdominal pain.  Genitourinary:  Negative for dysuria.  Musculoskeletal:  Positive for back pain.   Other systems negative  Physical Exam  Patient Vitals for the past 24 hrs:  BP Pulse Resp SpO2 Height Weight  07/04/23 2130 138/89 97 17 100 % 5\' 7"  (1.702 m) 114.3 kg   Constitutional: Well-developed, well-nourished female in no acute distress but doubled over in pain Cardiovascular: normal rate  Respiratory: normal effort GI: Abd soft, non-tender, gravid appropriate for gestational age.   No rebound or guarding. MS: Extremities nontender, no edema, normal ROM Neurologic: Alert and oriented x 4.  GU: Neg CVAT on left, + on right.    FHT:   140  Labs: Results for orders placed or performed during the hospital encounter of 07/04/23 (from the past 24 hour(s))  Urinalysis, Routine w reflex microscopic -Urine, Clean Catch     Status: Abnormal   Collection Time: 07/04/23  9:37 PM  Result Value Ref Range   Color, Urine COLORLESS (A) YELLOW   APPearance CLEAR CLEAR   Specific Gravity, Urine 1.002 (L) 1.005 - 1.030   pH 6.0 5.0 - 8.0   Glucose, UA NEGATIVE NEGATIVE mg/dL   Hgb urine dipstick LARGE (A) NEGATIVE   Bilirubin Urine NEGATIVE NEGATIVE   Ketones, ur NEGATIVE NEGATIVE mg/dL   Protein, ur NEGATIVE NEGATIVE mg/dL   Nitrite NEGATIVE NEGATIVE   Leukocytes,Ua NEGATIVE NEGATIVE   RBC / HPF 0-5 0 - 5 RBC/hpf   WBC, UA 0-5 0 - 5 WBC/hpf   Bacteria, UA RARE (A) NONE SEEN   Squamous Epithelial / HPF 0-5 0 - 5 /HPF  CBC     Status: Abnormal   Collection Time: 07/04/23  9:58 PM  Result Value  Ref Range   WBC 9.4 4.0 - 10.5 K/uL   RBC 3.97 3.87 - 5.11 MIL/uL   Hemoglobin 9.9 (L) 12.0 - 15.0 g/dL   HCT 16.1 (  L) 36.0 - 46.0 %   MCV 80.1 80.0 - 100.0 fL   MCH 24.9 (L) 26.0 - 34.0 pg   MCHC 31.1 30.0 - 36.0 g/dL   RDW 16.1 09.6 - 04.5 %   Platelets 212 150 - 400 K/uL   nRBC 0.0 0.0 - 0.2 %  Comprehensive metabolic panel     Status: Abnormal   Collection Time: 07/04/23  9:58 PM  Result Value Ref Range   Sodium 130 (L) 135 - 145 mmol/L   Potassium 3.5 3.5 - 5.1 mmol/L   Chloride 105 98 - 111 mmol/L   CO2 15 (L) 22 - 32 mmol/L   Glucose, Bld 87 70 - 99 mg/dL   BUN 16 6 - 20 mg/dL   Creatinine, Ser 4.09 0.44 - 1.00 mg/dL   Calcium 8.9 8.9 - 81.1 mg/dL   Total Protein 6.7 6.5 - 8.1 g/dL   Albumin 2.8 (L) 3.5 - 5.0 g/dL   AST 23 15 - 41 U/L   ALT 50 (H) 0 - 44 U/L   Alkaline Phosphatase 91 38 - 126 U/L   Total Bilirubin 0.5 <1.2 mg/dL   GFR, Estimated >91 >47 mL/min   Anion gap 10 5 - 15     Imaging:  US ABDOMEN LIMITED RUQ (LIVER/GB)  Result Date: 07/04/2023 CLINICAL DATA:  Right flank pain. EXAM: ULTRASOUND ABDOMEN LIMITED RIGHT UPPER QUADRANT COMPARISON:  None Available. FINDINGS: Gallbladder: No gallstones or wall thickening visualized (1.5 mm). No sonographic Murphy sign noted by sonographer. Common bile duct: Diameter: N/A   (The common bile duct is not clearly identified.) Liver: No focal lesion identified. Within normal limits in parenchymal echogenicity. Portal vein is patent on color Doppler imaging with normal direction of blood flow towards the liver. Other: None. IMPRESSION: 1. Limited visualization of the common bile duct. 2. Unremarkable ultrasonographic appearance of the gallbladder and liver. Electronically Signed   By: Aram Candela M.D.   On: 07/04/2023 23:54   US RENAL  Result Date: 07/04/2023 CLINICAL DATA:  Right flank pain x7 hours. EXAM: RENAL / URINARY TRACT ULTRASOUND COMPLETE COMPARISON:  None Available. FINDINGS: Right Kidney: Renal  measurements: 15.5 cm x 7.6 cm x 6.5 cm = volume: 398.6 mL. Echogenicity within normal limits. No mass is visualized. Moderate severity right-sided hydronephrosis is seen. Left Kidney: Renal measurements: 12.3 cm x 5.7 cm x 5.8 cm = volume: 213.9 mL. Echogenicity within normal limits. No mass or hydronephrosis visualized. Bladder: The urinary bladder is poorly distended and subsequently limited in evaluation. Other: None. IMPRESSION: Moderate severity right-sided hydronephrosis. Electronically Signed   By: Aram Candela M.D.   On: 07/04/2023 23:02     MAU Course/MDM: I have reviewed the triage vital signs and the nursing notes.   Pertinent labs & imaging results that were available during my care of the patient were reviewed by me and considered in my medical decision making (see chart for details).      I have reviewed her medical records including past results, notes and treatments.   I have ordered labs and reviewed results. There is no leukocytosis.  There is mild elevation in ALT.  Urine is dilute with hematuria.   Consult Drs Despina Hidden and [facility] with presentation, exam findings and test results.  They recommend consulting Urology.  Treatments in MAU included IV fluids, Dilaudid and Zofran. .    RUQ [place] is negative for stones Attempted calling Urology but unable to get through to them as they are in surgery  Patient began vomiting (treated with Phenergan), so Will admit for pain control and hydration and consult Urology when available.   Assessment: Single IUP at [redacted]w[redacted]d Right flank pain Right moderate hydronephrosis Hematuria Suspect right nephrolithiasis Nausea and vomiting  Plan: Admit to Antenatal  Routine orders per [facility] CNM [facility] to follow  Wynelle Bourgeois CNM, MSN Certified Nurse-Midwife 07/04/2023 9:49 PM

## 2023-07-04 NOTE — MAU Note (Signed)
Pt reports nausea and began having vomiting while in triage.

## 2023-07-05 ENCOUNTER — Other Ambulatory Visit: Payer: Self-pay

## 2023-07-05 ENCOUNTER — Inpatient Hospital Stay (HOSPITAL_COMMUNITY): Payer: BC Managed Care – PPO

## 2023-07-05 DIAGNOSIS — Z8249 Family history of ischemic heart disease and other diseases of the circulatory system: Secondary | ICD-10-CM | POA: Diagnosis not present

## 2023-07-05 DIAGNOSIS — Z3A15 15 weeks gestation of pregnancy: Secondary | ICD-10-CM | POA: Diagnosis not present

## 2023-07-05 DIAGNOSIS — N132 Hydronephrosis with renal and ureteral calculous obstruction: Secondary | ICD-10-CM | POA: Diagnosis present

## 2023-07-05 DIAGNOSIS — O26892 Other specified pregnancy related conditions, second trimester: Secondary | ICD-10-CM

## 2023-07-05 DIAGNOSIS — R109 Unspecified abdominal pain: Secondary | ICD-10-CM

## 2023-07-05 DIAGNOSIS — Z87442 Personal history of urinary calculi: Secondary | ICD-10-CM | POA: Diagnosis not present

## 2023-07-05 DIAGNOSIS — Z79899 Other long term (current) drug therapy: Secondary | ICD-10-CM | POA: Diagnosis not present

## 2023-07-05 DIAGNOSIS — O99212 Obesity complicating pregnancy, second trimester: Secondary | ICD-10-CM | POA: Diagnosis present

## 2023-07-05 DIAGNOSIS — O09522 Supervision of elderly multigravida, second trimester: Secondary | ICD-10-CM | POA: Diagnosis not present

## 2023-07-05 DIAGNOSIS — R319 Hematuria, unspecified: Secondary | ICD-10-CM

## 2023-07-05 DIAGNOSIS — E66813 Obesity, class 3: Secondary | ICD-10-CM | POA: Diagnosis present

## 2023-07-05 DIAGNOSIS — O99012 Anemia complicating pregnancy, second trimester: Secondary | ICD-10-CM | POA: Diagnosis present

## 2023-07-05 DIAGNOSIS — R112 Nausea with vomiting, unspecified: Secondary | ICD-10-CM

## 2023-07-05 DIAGNOSIS — Z885 Allergy status to narcotic agent status: Secondary | ICD-10-CM | POA: Diagnosis not present

## 2023-07-05 DIAGNOSIS — O26832 Pregnancy related renal disease, second trimester: Secondary | ICD-10-CM | POA: Diagnosis present

## 2023-07-05 DIAGNOSIS — N1330 Unspecified hydronephrosis: Secondary | ICD-10-CM

## 2023-07-05 DIAGNOSIS — O99891 Other specified diseases and conditions complicating pregnancy: Secondary | ICD-10-CM

## 2023-07-05 LAB — TYPE AND SCREEN
ABO/RH(D): O POS
Antibody Screen: NEGATIVE

## 2023-07-05 MED ORDER — TAMSULOSIN HCL 0.4 MG PO CAPS
0.4000 mg | ORAL_CAPSULE | Freq: Every day | ORAL | Status: DC
  Administered 2023-07-05: 0.4 mg via ORAL
  Filled 2023-07-05: qty 1

## 2023-07-05 MED ORDER — PRENATAL MULTIVITAMIN CH
1.0000 | ORAL_TABLET | Freq: Every day | ORAL | Status: DC
  Filled 2023-07-05: qty 1

## 2023-07-05 MED ORDER — ACETAMINOPHEN 325 MG PO TABS
650.0000 mg | ORAL_TABLET | ORAL | Status: DC | PRN
  Administered 2023-07-06: 650 mg via ORAL
  Filled 2023-07-05: qty 2

## 2023-07-05 MED ORDER — CALCIUM CARBONATE ANTACID 500 MG PO CHEW: 2.0000 | CHEWABLE_TABLET | ORAL | Status: DC | PRN

## 2023-07-05 MED ORDER — LACTATED RINGERS IV SOLN: INTRAVENOUS | Status: AC

## 2023-07-05 MED ORDER — SODIUM CHLORIDE 0.9 % IV SOLN: 12.5000 mg | Freq: Four times a day (QID) | INTRAVENOUS | Status: DC | PRN

## 2023-07-05 MED ORDER — DOCUSATE SODIUM 100 MG PO CAPS
100.0000 mg | ORAL_CAPSULE | Freq: Every day | ORAL | Status: DC
  Filled 2023-07-05: qty 1

## 2023-07-05 MED ORDER — KETOROLAC TROMETHAMINE 30 MG/ML IJ SOLN
30.0000 mg | Freq: Four times a day (QID) | INTRAMUSCULAR | Status: DC | PRN
  Administered 2023-07-05 – 2023-07-06 (×5): 30 mg via INTRAVENOUS
  Filled 2023-07-05 (×5): qty 1

## 2023-07-05 MED ORDER — SODIUM CHLORIDE 0.9 % IV SOLN
12.5000 mg | Freq: Once | INTRAVENOUS | Status: AC
  Administered 2023-07-05: 12.5 mg via INTRAVENOUS
  Filled 2023-07-05 (×2): qty 0.5

## 2023-07-05 MED ORDER — SODIUM CHLORIDE 0.9% FLUSH
10.0000 mL | Freq: Two times a day (BID) | INTRAVENOUS | Status: DC
  Administered 2023-07-05: 10 mL via INTRAVENOUS

## 2023-07-05 MED ORDER — ONDANSETRON HCL 4 MG/2ML IJ SOLN: 4.0000 mg | Freq: Four times a day (QID) | INTRAMUSCULAR | Status: DC | PRN

## 2023-07-05 MED ORDER — ACETAMINOPHEN 500 MG PO TABS
1000.0000 mg | ORAL_TABLET | Freq: Once | ORAL | Status: DC
  Filled 2023-07-05: qty 2

## 2023-07-05 MED ORDER — HYDROMORPHONE HCL 1 MG/ML IJ SOLN
1.0000 mg | INTRAMUSCULAR | Status: DC | PRN
  Administered 2023-07-06: 1 mg via INTRAVENOUS
  Filled 2023-07-05: qty 1

## 2023-07-05 MED ORDER — DIPHENHYDRAMINE HCL 25 MG PO CAPS: 25.0000 mg | ORAL_CAPSULE | Freq: Four times a day (QID) | ORAL | Status: DC | PRN

## 2023-07-05 NOTE — Consult Note (Addendum)
Urology Consult Note   Requesting Attending Physician:  Hoover Browns, MD Service Providing Consult: Urology  Consulting Attending: Dr. Cardell Peach   Reason for Consult: Flank pain, hydronephrosis  HPI: Sarah Saunders is seen in consultation for reasons noted above at the request of Hoover Browns, MD. Patient is known to our practice and was previously seen by Dr. Sherron Monday for a stone/stent.  She carries a history of stones but is not presently followed by urologist.  Recognizing her flank pain as a probable kidney stone.  Patient presented to the [facility] urgent care, as she has [redacted] weeks pregnant.  Renal ultrasound showed right side moderate hydronephrosis with no mention of ureteral jets on either side.  CT A/P has not yet been collected.  On arrival patient was alert, oriented, in no distress.  Her pain has been well-controlled.  She was accompanied by her husband.  We covered the natural process of kidney stones, likelihood of passage, and possible interventions or lack thereof that we may explore after obtaining CT imaging.  All questions were answered to their satisfaction.  ------------------  Assessment:  47 y.o. female with flank pain with moderate right side hydronephrosis   Recommendations: # Right hydronephrosis # Right flank pain  Being that she is only [redacted] weeks pregnant, this is unlikely to be hydronephrosis of pregnancy.  There is a probable obstruction but patient understands that in order to risk stratify medical expulsive therapy versus percutaneous nephrostomy tube versus cystoscopy with ureteral stent placement, we will need to verify the presence of the stone, see the location in the ureter, and document the size.  Discussed case and plan with her OB.  They are ordering a CT A/P.  Reviewed the results and make a decision tomorrow morning.  No leukocytes, rare bacteria on UA. Unlikely to represent infection.   Show at midnight.  Case and plan discussed with Dr. 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[facility])     Past Surgical History:  Past Surgical History:  Procedure Laterality Date   JOINT REPLACEMENT Left 01/13/2021   hip   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)     TUBAL LIGATION     right fallopian tube removed   tube removed      Medication: Current Facility-Administered Medications  Medication Dose Route Frequency Provider Last Rate Last Admin   acetaminophen (TYLENOL) tablet 1,000 mg  1,000 mg Oral Once Aviva Signs, CNM       acetaminophen (TYLENOL) tablet 650 mg  650 mg Oral Q4H PRN Dale [place], FNP       calcium carbonate (TUMS - dosed in mg elemental calcium) chewable tablet 400 mg of elemental calcium  2 tablet Oral Q4H PRN [place], Lesly Rubenstein, FNP       docusate sodium (COLACE) capsule 100 mg  100 mg Oral Daily [place], [place], FNP       HYDROmorphone (DILAUDID) injection 1 mg  1 mg Intravenous Q2H PRN [place], Lesly Rubenstein, FNP       ketorolac (TORADOL) 30 MG/ML injection 30 mg  30 mg Intravenous Q6H PRN Dale Mathis, FNP   30 mg at 07/05/23 7829   lactated ringers infusion   Intravenous Continuous Aviva Signs, CNM 125 mL/hr at 07/05/23 1314 New Bag at 07/05/23 1314   ondansetron (ZOFRAN) injection 4 mg  4  mg Intravenous Q6H PRN Dale , FNP       prenatal multivitamin tablet 1 tablet  1 tablet Oral Q1200 [place], [place], [place]       promethazine (PHENERGAN) 12.5 mg in sodium chloride 0.9 % 50 mL IVPB  12.5 mg Intravenous Q6H PRN [place], Lesly Rubenstein, FNP       sodium chloride flush (NS) 0.9 % injection 10 mL  10 mL Intravenous Q12H [place], [place], FNP       tamsulosin [facility]) capsule 0.4 mg  0.4 mg Oral Daily [place], [place], FNP   0.4 mg at 07/05/23 1536    Allergies: Allergies  Allergen Reactions   Butorphanol Hives and Itching    Social History: Social History    Tobacco Use   Smoking status: Never   Smokeless tobacco: Never  Vaping Use   Vaping status: Never Used  Substance Use Topics   Alcohol use: No   Drug use: No    Family History Family History  Problem Relation Age of Onset   Hypertension Father    Heart failure Father    Hypertension Mother    Heart failure Brother     Review of Systems  Genitourinary:  Positive for flank pain.     Objective   Vital signs in last 24 hours: BP 110/61 (BP Location: Left Arm)   Pulse 61   Temp 98.3 F (36.8 C) (Oral)   Resp 17   Ht 5\' 7"  (1.702 m)   Wt 114.3 kg   SpO2 98%   BMI 39.47 kg/m   Physical Exam General: NAD, A&O, resting, appropriate HEENT: [place]/AT Pulmonary: Normal work of breathing Cardiovascular: no cyanosis   Most Recent Labs: Lab Results  Component Value Date   WBC 9.4 07/04/2023   HGB 9.9 (L) 07/04/2023   HCT 31.8 (L) 07/04/2023   PLT 212 07/04/2023    Lab Results  Component Value Date   NA 130 (L) 07/04/2023   K 3.5 07/04/2023   CL 105 07/04/2023   CO2 15 (L) 07/04/2023   BUN 16 07/04/2023   CREATININE 0.93 07/04/2023   CALCIUM 8.9 07/04/2023    Lab Results  Component Value Date   INR 1.0 06/19/2022   APTT 26 06/19/2022     Urine Culture: @[id number] (laburin,org,[id number],[id number])@   IMAGING: US ABDOMEN LIMITED RUQ (LIVER/GB)  Result Date: 07/04/2023 CLINICAL DATA:  Right flank pain. EXAM: ULTRASOUND ABDOMEN LIMITED RIGHT UPPER QUADRANT COMPARISON:  None Available. FINDINGS: Gallbladder: No gallstones or wall thickening visualized (1.5 mm). No sonographic Murphy sign noted by sonographer. Common bile duct: Diameter: N/A   (The common bile duct is not clearly identified.) Liver: No focal lesion identified. Within normal limits in parenchymal echogenicity. Portal vein is patent on color Doppler imaging with normal direction of blood flow towards the liver. Other: None. IMPRESSION: 1. Limited visualization of the common bile duct. 2. Unremarkable  ultrasonographic appearance of the gallbladder and liver. Electronically Signed   By: Aram Candela M.D.   On: 07/04/2023 23:54   US RENAL  Result Date: 07/04/2023 CLINICAL DATA:  Right flank pain x7 hours. EXAM: RENAL / URINARY TRACT ULTRASOUND COMPLETE COMPARISON:  None Available. FINDINGS: Right Kidney: Renal measurements: 15.5 cm x 7.6 cm x 6.5 cm = volume: 398.6 mL. Echogenicity within normal limits. No mass is visualized. Moderate severity right-sided hydronephrosis is seen. Left Kidney: Renal measurements: 12.3 cm x 5.7 cm x 5.8 cm = volume: 213.9 mL. Echogenicity within normal limits. No mass or hydronephrosis visualized. Bladder: The urinary bladder  is poorly distended and subsequently limited in evaluation. Other: None. IMPRESSION: Moderate severity right-sided hydronephrosis. Electronically Signed   By: Aram Candela M.D.   On: 07/04/2023 23:02    ------  Elmon Kirschner, NP Pager: [phone]   Please contact the urology consult pager with any further questions/concerns.  I have seen and examined the patient and agree with the above assessment and plan.  Patient is a 47 year old female who is pregnant at [redacted] weeks presenting with right-sided flank pain.  She does have a prior history of urolithiasis and is passed multiple stones as well as requiring ureteroscopy with stone extraction in 2022.  Last imaging in our healthcare system in 2021 demonstrated distal ureteral stone and multiple nonobstructing stones in the right lower pole.  She denies fevers, chills, dysuria.  She denies gross hematuria.  She states that she developed pain yesterday around 2 PM and presented the hospital.  This pain has been well-managed throughout the day and she is currently without pain.  Recommend proceeding with low-dose noncontrast CT scan to evaluate for possible right ureteral stone.  Given prior successful medical expulsive therapy, initial recommendation would be to proceed with  medical expulsive therapy with Flomax and hydration.  If however, her pain is refractory given that she is in her second trimester, we could proceed with possible ureteroscopy with laser lithotripsy and extraction of stone.  We also discussed other option with serial nephrostomy tube exchanges.  We will follow result of CT scan and see her tomorrow morning.  Matt R. Lakysha Kossman MD [facility]  Pager: [phone]

## 2023-07-05 NOTE — H&P (Signed)
Sarah Saunders is a 47 y.o. female, [id number], IUP at 15.6  weeks, presenting for evaluation for possible kidney stone and hydronephrosis, admitted for pain control. Hematuria, right flank pain, suspected right nephrolithiasis and hydronephrosis.Pt endorse + Fm. Denies vaginal leakage. Denies vaginal bleeding. Denies feeling cxt's.   HPI Per MAU: HPI: Sarah Saunders is a 47 y.o. [id number] at 62w5dwho presents to maternity admissions reporting right flank pain since this afternoon.  Has a history of stones and had to  have a stent last time (2022).  Also has some nausea. . She denies LOF, vaginal bleeding, diarrhea, constipation or fever/chills.     Back Pain This is a new problem. The current episode started today. The problem occurs constantly. The problem has been gradually worsening since onset. The pain is at a severity of 10/10. The pain is severe. The pain is The same all the time. Associated symptoms include abdominal pain. Pertinent negatives include no dysuria or fever. Risk factors include pregnancy (history of stones). She has tried nothing for the symptoms.    RN Note: Sarah Saunders is a 47 y.o. at [redacted]w[redacted]d here in MAU reporting: right flank pain that started at around 2 pm today. Pain radiates to right lower abd. Reports she has a history of kidney stones and it feels like she is trying to pass a stone.  Onset of complaint: 2 pm Pain score: 10/10  Patient Active Problem List   Diagnosis Date Noted   Kidney stone complicating pregnancy 07/05/2023   IUFD at 20 weeks or more of gestation 06/19/2022   Medical clearance for incarceration 12/15/2020   PSVT (paroxysmal supraventricular tachycardia) (HCC) 05/26/2019   Iron deficiency anemia 12/13/2016   Morbid obesity (HCC) 10/09/2016   Tachycardia 01/08/2012   Benign neoplasm of skin 03/06/2008   POLYCYSTIC OVARIAN DISEASE 02/27/2008   Overweight 02/27/2008   Allergic rhinitis 02/27/2008     Active Ambulatory Problems     Diagnosis Date Noted   Benign neoplasm of skin 03/06/2008   POLYCYSTIC OVARIAN DISEASE 02/27/2008   Overweight 02/27/2008   Allergic rhinitis 02/27/2008   Tachycardia 01/08/2012   Morbid obesity (HCC) 10/09/2016   Iron deficiency anemia 12/13/2016   PSVT (paroxysmal supraventricular tachycardia) (HCC) 05/26/2019   Medical clearance for incarceration 12/15/2020   IUFD at 20 weeks or more of gestation 06/19/2022   Resolved Ambulatory Problems    Diagnosis Date Noted   No Resolved Ambulatory Problems   Past Medical History:  Diagnosis Date   Anxiety    Pituitary tumor    Polycystic ovary disease    Prolactinoma (HCC)       Medications Prior to Admission  Medication Sig Dispense Refill Last Dose   acetaminophen (TYLENOL) 325 MG tablet Take 2 tablets (650 mg total) by mouth every 4 (four) hours as needed for mild pain or moderate pain (for pain scale < 4  OR  temperature  >/=  100.5 F). 30 tablet 1 07/04/2023 at 1400   Cetirizine HCl (ZYRTEC ALLERGY) 10 MG CAPS Take by oral route.   07/03/2023   ferrous sulfate 325 (65 FE) MG EC tablet Take 1 tablet (325 mg total) by mouth every other day. 30 tablet 1 07/03/2023   Prenatal Vit-Fe Fumarate-FA (PRENATAL VITAMIN PO) Take 1 tablet by mouth daily.   07/03/2023   RABEprazole (ACIPHEX) 20 MG tablet Take 20 mg by mouth daily.    07/03/2023   sertraline (ZOLOFT) 100 MG tablet Take 150 mg by mouth daily.  07/03/2023   albuterol (PROVENTIL HFA;VENTOLIN HFA) 108 (90 BASE) MCG/ACT inhaler Inhale 2 puffs into the lungs every 4 (four) hours as needed for wheezing or shortness of breath.      Azelastine HCl 137 MCG/SPRAY SOLN Place 1 spray into both nostrils daily.      Calcium Citrate-Vitamin D (CALCIUM CITRATE +D PO) Take 1 capsule by mouth in the morning, at noon, in the evening, and at bedtime. 4 times a day      Fe Fum-Fe Poly-Vit C-Lactobac (FUSION PO) Take 1 capsule by mouth daily.      ipratropium (ATROVENT) 0.03 % nasal spray Place 1 spray  into both nostrils in the morning and at bedtime.      metoprolol tartrate (LOPRESSOR) 25 MG tablet Take 0.5 tablets (12.5 mg total) by mouth 2 (two) times daily. 90 tablet 3    VITAMIN D PO Take 2,000 Units by mouth every morning.   Unknown    Past Medical History:  Diagnosis Date   Anxiety    Morbid obesity (HCC) 10/09/2016   Pituitary tumor    Polycystic ovary disease    Prolactinoma (HCC)      No current facility-administered medications on file prior to encounter.   Current Outpatient Medications on File Prior to Encounter  Medication Sig Dispense Refill   acetaminophen (TYLENOL) 325 MG tablet Take 2 tablets (650 mg total) by mouth every 4 (four) hours as needed for mild pain or moderate pain (for pain scale < 4  OR  temperature  >/=  100.5 F). 30 tablet 1   Cetirizine HCl (ZYRTEC ALLERGY) 10 MG CAPS Take by oral route.     ferrous sulfate 325 (65 FE) MG EC tablet Take 1 tablet (325 mg total) by mouth every other day. 30 tablet 1   Prenatal Vit-Fe Fumarate-FA (PRENATAL VITAMIN PO) Take 1 tablet by mouth daily.     RABEprazole (ACIPHEX) 20 MG tablet Take 20 mg by mouth daily.      sertraline (ZOLOFT) 100 MG tablet Take 150 mg by mouth daily.      albuterol (PROVENTIL HFA;VENTOLIN HFA) 108 (90 BASE) MCG/ACT inhaler Inhale 2 puffs into the lungs every 4 (four) hours as needed for wheezing or shortness of breath.     Azelastine HCl 137 MCG/SPRAY SOLN Place 1 spray into both nostrils daily.     Calcium Citrate-Vitamin D (CALCIUM CITRATE +D PO) Take 1 capsule by mouth in the morning, at noon, in the evening, and at bedtime. 4 times a day     Fe Fum-Fe Poly-Vit C-Lactobac (FUSION PO) Take 1 capsule by mouth daily.     ipratropium (ATROVENT) 0.03 % nasal spray Place 1 spray into both nostrils in the morning and at bedtime.     metoprolol tartrate (LOPRESSOR) 25 MG tablet Take 0.5 tablets (12.5 mg total) by mouth 2 (two) times daily. 90 tablet 3   VITAMIN D PO Take 2,000 Units by mouth every  morning.       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 Social History:  reports that she has never smoked. She has never used smokeless tobacco. She reports that she does not drink alcohol and does not use drugs.  ROS:  Review of Systems  Constitutional:  Negative for fever.  Gastrointestinal:  Positive for abdominal pain.  Genitourinary:  Negative for dysuria.  Musculoskeletal:  Positive for back pain.    Other systems negative   Physical Exam  Patient Vitals for the past 24 hrs:   BP Pulse Resp SpO2 Height Weight  07/04/23 2130 138/89 97 17 100 % 5\' 7"  (1.702 m) 114.3 kg    Constitutional: Well-developed, well-nourished female in no acute distress but doubled over in pain Cardiovascular: normal rate  Respiratory: normal effort GI: Abd soft, non-tender, gravid appropriate for gestational age.   No rebound or guarding. MS: Extremities nontender, no edema, normal ROM Neurologic: Alert and oriented x 4.  GU: Neg CVAT on left, + on right.  FHT:   140  Labs: Results for orders placed or performed during the hospital encounter of 07/04/23 (from the past 24 hour(s))  Urinalysis, Routine w reflex microscopic -Urine, Clean Catch     Status: Abnormal   Collection Time:  07/04/23  9:37 PM  Result Value Ref Range   Color, Urine COLORLESS (A) YELLOW   APPearance CLEAR CLEAR   Specific Gravity, Urine 1.002 (L) 1.005 - 1.030   pH 6.0 5.0 - 8.0   Glucose, UA NEGATIVE NEGATIVE mg/dL   Hgb urine dipstick LARGE (A) NEGATIVE   Bilirubin Urine NEGATIVE NEGATIVE   Ketones, ur NEGATIVE NEGATIVE mg/dL   Protein, ur NEGATIVE NEGATIVE mg/dL   Nitrite NEGATIVE NEGATIVE   Leukocytes,Ua NEGATIVE NEGATIVE   RBC / HPF 0-5 0 - 5 RBC/hpf   WBC, UA 0-5 0 - 5 WBC/hpf   Bacteria, UA RARE (A) NONE SEEN   Squamous Epithelial / HPF 0-5 0 - 5 /HPF  CBC     Status: Abnormal   Collection Time: 07/04/23  9:58 PM  Result Value Ref Range   WBC 9.4 4.0 - 10.5 K/uL   RBC 3.97 3.87 - 5.11 MIL/uL   Hemoglobin 9.9 (L) 12.0 - 15.0 g/dL   HCT 62.1 (L) 30.8 - 65.7 %   MCV 80.1 80.0 - 100.0 fL   MCH 24.9 (L) 26.0 - 34.0 pg   MCHC 31.1 30.0 - 36.0 g/dL   RDW 84.6 96.2 - 95.2 %   Platelets 212 150 - 400 K/uL   nRBC 0.0 0.0 - 0.2 %  Comprehensive metabolic panel     Status: Abnormal   Collection Time: 07/04/23  9:58 PM  Result Value Ref Range   Sodium 130 (L) 135 - 145 mmol/L   Potassium 3.5 3.5 - 5.1 mmol/L   Chloride 105 98 - 111 mmol/L   CO2 15 (L) 22 - 32 mmol/L   Glucose, Bld 87 70 - 99 mg/dL   BUN 16 6 -  20 mg/dL   Creatinine, Ser 6.96 0.44 - 1.00 mg/dL   Calcium 8.9 8.9 - 29.5 mg/dL   Total Protein 6.7 6.5 - 8.1 g/dL   Albumin 2.8 (L) 3.5 - 5.0 g/dL   AST 23 15 - 41 U/L   ALT 50 (H) 0 - 44 U/L   Alkaline Phosphatase 91 38 - 126 U/L   Total Bilirubin 0.5 <1.2 mg/dL   GFR, Estimated >28 >41 mL/min   Anion gap 10 5 - 15  Type and screen [facility]     Status: None (Preliminary result)   Collection Time: 07/05/23 12:45 AM  Result Value Ref Range   ABO/RH(D) PENDING    Antibody Screen PENDING    Sample Expiration      07/08/2023,2359 Performed at [facility], [address] [place], [place] [address]     Imaging:  US ABDOMEN LIMITED RUQ  (LIVER/GB)  Result Date: 07/04/2023 CLINICAL DATA:  Right flank pain. EXAM: ULTRASOUND ABDOMEN LIMITED RIGHT UPPER QUADRANT COMPARISON:  None Available. FINDINGS: Gallbladder: No gallstones or wall thickening visualized (1.5 mm). No sonographic Murphy sign noted by sonographer. Common bile duct: Diameter: N/A   (The common bile duct is not clearly identified.) Liver: No focal lesion identified. Within normal limits in parenchymal echogenicity. Portal vein is patent on color Doppler imaging with normal direction of blood flow towards the liver. Other: None. IMPRESSION: 1. Limited visualization of the common bile duct. 2. Unremarkable ultrasonographic appearance of the gallbladder and liver. Electronically Signed   By: Aram Candela M.D.   On: 07/04/2023 23:54   US RENAL  Result Date: 07/04/2023 CLINICAL DATA:  Right flank pain x7 hours. EXAM: RENAL / URINARY TRACT ULTRASOUND COMPLETE COMPARISON:  None Available. FINDINGS: Right Kidney: Renal measurements: 15.5 cm x 7.6 cm x 6.5 cm = volume: 398.6 mL. Echogenicity within normal limits. No mass is visualized. Moderate severity right-sided hydronephrosis is seen. Left Kidney: Renal measurements: 12.3 cm x 5.7 cm x 5.8 cm = volume: 213.9 mL. Echogenicity within normal limits. No mass or hydronephrosis visualized. Bladder: The urinary bladder is poorly distended and subsequently limited in evaluation. Other: None. IMPRESSION: Moderate severity right-sided hydronephrosis. Electronically Signed   By: Aram Candela M.D.   On: 07/04/2023 23:02    MAU Course: Orders Placed This Encounter  Procedures   Urine Culture   US RENAL   US ABDOMEN LIMITED RUQ (LIVER/GB)   Urinalysis, Routine w reflex microscopic -Urine, Clean Catch   CBC   Comprehensive metabolic panel   Diet NPO time specified   Notify physician (specify)   Vital signs   Defer vaginal exam for vaginal bleeding or PROM <37 weeks   Apply Antepartum Care Plan   Initiate Oral Care Protocol    Initiate Carrier Fluid Protocol   Assess fetal heart tones by doppler   Activity as tolerated   Full code   Consult to urology Consult Timeframe: ROUTINE - requires response within 24 hours; Reason for Consult? Kidney stones in pregnancy 16 weeks   Type and screen [facility]   Admit to Inpatient (patient's expected length of stay will be greater than 2 midnights or inpatient only procedure)   Meds ordered this encounter  Medications   0.9 %  sodium chloride infusion   HYDROmorphone (DILAUDID) injection 1 mg   ondansetron (ZOFRAN) injection 4 mg   HYDROmorphone (DILAUDID) injection 1 mg   acetaminophen (TYLENOL) tablet 1,000 mg   promethazine (PHENERGAN) 12.5 mg in  sodium chloride 0.9 % 50 mL IVPB   lactated ringers infusion   acetaminophen (TYLENOL) tablet 650 mg   docusate sodium (COLACE) capsule 100 mg   calcium carbonate (TUMS - dosed in mg elemental calcium) chewable tablet 400 mg of elemental calcium   prenatal multivitamin tablet 1 tablet   sodium chloride flush (NS) 0.9 % injection 10 mL   ketorolac (TORADOL) 30 MG/ML injection 30 mg   ondansetron (ZOFRAN) injection 4 mg   promethazine (PHENERGAN) 12.5 mg in sodium chloride 0.9 % 50 mL IVPB   HYDROmorphone (DILAUDID) injection 1 mg    Assessment/Plan: Sarah Saunders is a 47 y.o. female, [id number], IUP at 15.6  weeks, presenting for evaluation for possible kidney stone and hydronephrosis, admitted for pain control. Hematuria, right flank pain, suspected right nephrolithiasis and hydronephrosis. Pt endorse + Fm. Denies vaginal leakage. Denies vaginal bleeding. Denies feeling cxt's.   Plan: Admit to Antepartum per consult with Dr Sallye Ober Routine [facility] orders Pain med/1mg  IV dilaudid PRN Q2H, Ketorolac 30mg  IV Q6H PRN/Tylenol IV zofran and phenergan for nausea.  Urology consult in the morning. NPO for now.   OB UC Activity as tolerates.   [facility] CNM, FNP-C, PMHNP-BC  [address]   [place], [place] [address]  Cell: [phone]  Office Phone: [phone] Fax: [phone] 07/05/2023  1:17 AM

## 2023-07-05 NOTE — Progress Notes (Signed)
CT Dept would not take patient for CT as the consent had not been signed. Patient seen at bedside and the consent was reviewed in full and the risks for CT A/P during pregnancy were reviewed in full detail. We discussed the risks for harm are low. The consent form was read aloud to the patient and her husband per their request. They understood the information and all questions were invited and answered. Consent was signed. Primary RN to make CT Dept aware of signed consent.  Steva Ready, DO

## 2023-07-06 ENCOUNTER — Inpatient Hospital Stay (HOSPITAL_COMMUNITY): Payer: BC Managed Care – PPO | Admitting: Certified Registered Nurse Anesthetist

## 2023-07-06 ENCOUNTER — Encounter (HOSPITAL_COMMUNITY)
Admission: AD | Disposition: A | Discharge status: Home / Self Care | Payer: Self-pay | Source: Home / Self Care | Attending: Obstetrics & Gynecology

## 2023-07-06 ENCOUNTER — Inpatient Hospital Stay (HOSPITAL_COMMUNITY): Payer: BC Managed Care – PPO

## 2023-07-06 HISTORY — PX: CYSTOSCOPY/URETEROSCOPY/HOLMIUM LASER/STENT PLACEMENT: SHX6546

## 2023-07-06 LAB — URINE CULTURE: Culture: NO GROWTH

## 2023-07-06 SURGERY — CYSTOSCOPY/URETEROSCOPY/HOLMIUM LASER/STENT PLACEMENT
Anesthesia: General | Laterality: Right

## 2023-07-06 MED ORDER — FENTANYL CITRATE (PF) 100 MCG/2ML IJ SOLN
INTRAMUSCULAR | Status: AC
  Filled 2023-07-06: qty 2

## 2023-07-06 MED ORDER — DEXAMETHASONE SODIUM PHOSPHATE 10 MG/ML IJ SOLN
INTRAMUSCULAR | Status: DC | PRN
  Administered 2023-07-06: 4 mg via INTRAVENOUS

## 2023-07-06 MED ORDER — HYDROMORPHONE HCL 1 MG/ML IJ SOLN: 0.2500 mg | INTRAMUSCULAR | Status: DC | PRN

## 2023-07-06 MED ORDER — ONDANSETRON HCL 4 MG/2ML IJ SOLN
INTRAMUSCULAR | Status: AC
  Filled 2023-07-06: qty 2

## 2023-07-06 MED ORDER — OXYCODONE HCL 5 MG/5ML PO SOLN
5.0000 mg | Freq: Once | ORAL | Status: DC | PRN
Start: 2023-07-06 — End: 2023-07-06

## 2023-07-06 MED ORDER — LIDOCAINE HCL (PF) 2 % IJ SOLN
INTRAMUSCULAR | Status: AC
  Filled 2023-07-06: qty 5

## 2023-07-06 MED ORDER — PROPOFOL 10 MG/ML IV BOLUS
INTRAVENOUS | Status: AC
  Filled 2023-07-06: qty 20

## 2023-07-06 MED ORDER — PHENYLEPHRINE 80 MCG/ML (10ML) SYRINGE FOR IV PUSH (FOR BLOOD PRESSURE SUPPORT)
PREFILLED_SYRINGE | INTRAVENOUS | Status: DC | PRN
  Administered 2023-07-06 (×6): 80 ug via INTRAVENOUS
  Administered 2023-07-06: 40 ug via INTRAVENOUS
  Administered 2023-07-06 (×2): 80 ug via INTRAVENOUS

## 2023-07-06 MED ORDER — PROPOFOL 10 MG/ML IV BOLUS
INTRAVENOUS | Status: DC | PRN
  Administered 2023-07-06: 150 mg via INTRAVENOUS

## 2023-07-06 MED ORDER — SODIUM CHLORIDE 0.9 % IV SOLN: 12.5000 mg | INTRAVENOUS | Status: DC | PRN

## 2023-07-06 MED ORDER — FENTANYL CITRATE (PF) 100 MCG/2ML IJ SOLN
INTRAMUSCULAR | Status: DC | PRN
  Administered 2023-07-06: 100 ug via INTRAVENOUS
  Administered 2023-07-06 (×2): 50 ug via INTRAVENOUS

## 2023-07-06 MED ORDER — SUCCINYLCHOLINE CHLORIDE 200 MG/10ML IV SOSY
PREFILLED_SYRINGE | INTRAVENOUS | Status: DC | PRN
  Administered 2023-07-06: 110 mg via INTRAVENOUS

## 2023-07-06 MED ORDER — ORAL CARE MOUTH RINSE: 15.0000 mL | Freq: Once | OROMUCOSAL | Status: AC

## 2023-07-06 MED ORDER — ROCURONIUM BROMIDE 10 MG/ML (PF) SYRINGE
PREFILLED_SYRINGE | INTRAVENOUS | Status: AC
  Filled 2023-07-06: qty 10

## 2023-07-06 MED ORDER — SODIUM CHLORIDE 0.9 % IR SOLN
Status: DC | PRN
  Administered 2023-07-06: 3000 mL via INTRAVESICAL

## 2023-07-06 MED ORDER — ESMOLOL HCL 100 MG/10ML IV SOLN
INTRAVENOUS | Status: DC | PRN
  Administered 2023-07-06: 30 mg via INTRAVENOUS

## 2023-07-06 MED ORDER — OXYCODONE HCL 5 MG PO TABS: 5.0000 mg | ORAL_TABLET | Freq: Once | ORAL | Status: DC | PRN

## 2023-07-06 MED ORDER — AMISULPRIDE (ANTIEMETIC) 5 MG/2ML IV SOLN: 10.0000 mg | Freq: Once | INTRAVENOUS | Status: DC | PRN

## 2023-07-06 MED ORDER — CHLORHEXIDINE GLUCONATE 0.12 % MT SOLN
15.0000 mL | Freq: Once | OROMUCOSAL | Status: AC
  Administered 2023-07-06: 15 mL via OROMUCOSAL

## 2023-07-06 MED ORDER — ONDANSETRON HCL 4 MG/2ML IJ SOLN
INTRAMUSCULAR | Status: DC | PRN
  Administered 2023-07-06: 4 mg via INTRAVENOUS

## 2023-07-06 MED ORDER — SUCCINYLCHOLINE CHLORIDE 200 MG/10ML IV SOSY
PREFILLED_SYRINGE | INTRAVENOUS | Status: AC
  Filled 2023-07-06: qty 10

## 2023-07-06 MED ORDER — LACTATED RINGERS IV SOLN: INTRAVENOUS | Status: DC

## 2023-07-06 MED ORDER — CEFAZOLIN SODIUM-DEXTROSE 2-4 GM/100ML-% IV SOLN
INTRAVENOUS | Status: AC
  Filled 2023-07-06: qty 100

## 2023-07-06 MED ORDER — DEXAMETHASONE SODIUM PHOSPHATE 10 MG/ML IJ SOLN
INTRAMUSCULAR | Status: AC
  Filled 2023-07-06: qty 1

## 2023-07-06 MED ORDER — NEOSTIGMINE METHYLSULFATE 3 MG/3ML IV SOSY
PREFILLED_SYRINGE | INTRAVENOUS | Status: AC
  Filled 2023-07-06: qty 3

## 2023-07-06 MED ORDER — OXYCODONE HCL 5 MG PO TABS: 5.0000 mg | ORAL_TABLET | Freq: Four times a day (QID) | ORAL | 0 refills | Status: AC | PRN

## 2023-07-06 MED ORDER — IOHEXOL 300 MG/ML  SOLN
INTRAMUSCULAR | Status: DC | PRN
  Administered 2023-07-06: 5 mL

## 2023-07-06 SURGICAL SUPPLY — 25 items
BAG URO CATCHER STRL LF (MISCELLANEOUS) ×1 IMPLANT
BASKET ZERO TIP NITINOL 2.4FR (BASKET) IMPLANT
BENZOIN TINCTURE PRP APPL 2/3 (GAUZE/BANDAGES/DRESSINGS) IMPLANT
CATH URETERAL DUAL LUMEN 10F (MISCELLANEOUS) IMPLANT
CATH URETL OPEN 5X70 (CATHETERS) ×1 IMPLANT
CLOTH BEACON ORANGE TIMEOUT ST (SAFETY) ×1 IMPLANT
DRSG TEGADERM 2-3/8X2-3/4 SM (GAUZE/BANDAGES/DRESSINGS) IMPLANT
FIBER LASER MOSES 200 DFL (Laser) IMPLANT
GLOVE BIOGEL M 7.0 STRL (GLOVE) ×1 IMPLANT
GOWN STRL REUS W/ TWL XL LVL3 (GOWN DISPOSABLE) ×1 IMPLANT
GOWN STRL REUS W/TWL XL LVL3 (GOWN DISPOSABLE) ×1
GUIDEWIRE STR DUAL SENSOR (WIRE) ×2 IMPLANT
GUIDEWIRE ZIPWRE .038 STRAIGHT (WIRE) IMPLANT
KIT TURNOVER KIT A (KITS) IMPLANT
LASER FIB FLEXIVA PULSE ID 365 (Laser) IMPLANT
MANIFOLD NEPTUNE II (INSTRUMENTS) ×1 IMPLANT
PACK CYSTO (CUSTOM PROCEDURE TRAY) ×1 IMPLANT
PAD PREP 24X48 CUFFED NSTRL (MISCELLANEOUS) ×1 IMPLANT
SHEATH DILATOR SET 8/10 (MISCELLANEOUS) IMPLANT
SHEATH NAVIGATOR HD 12/14X46 (SHEATH) IMPLANT
STENT URET 6FRX26 CONTOUR (STENTS) IMPLANT
TRACTIP FLEXIVA PULS ID 200XHI (Laser) IMPLANT
TRACTIP FLEXIVA PULSE ID 200 (Laser)
TUBING CONNECTING 10 (TUBING) ×1 IMPLANT
TUBING UROLOGY SET (TUBING) ×1 IMPLANT

## 2023-07-06 NOTE — Discharge Summary (Signed)
Physician Discharge Summary  Patient ID: XANDREA POLCE MRN: [id number] DOB/AGE: August 08, 1976 47 y.o.  Admit date: 07/04/2023 Discharge date: 07/06/2023  Admission Diagnoses:Kidney stone complicating pregnancy   Discharge Diagnoses:  Principal Problem:   Kidney stone complicating pregnancy Right ureter stone  Discharged Condition: good  Hospital Course: Procedure(s): 1.  Cystoscopy 2. Right ureteroscopy with laser lithotripsy and basket extraction of stones 3. Right retrograde pyelogram 4. Right ureteral stent placement 5. Fluoroscopy with intraoperative interpretation  Consults: urology  Significant Diagnostic Studies: labs: CBC, CMP, UA, urine culture, CT scan, and renal ultrasound  Treatments: IV hydration, analgesia: acetaminophen and Dilaudid, and surgery: Right ureteral stent placement  Discharge Exam: Blood pressure (!) 144/73, pulse 75, temperature (!) 97.5 F (36.4 C), temperature source Oral, resp. rate 20, height 5\' 7"  (1.702 m), weight 114.3 kg, SpO2 100%. General appearance: alert, cooperative, and no distress GI: normal findings: gravid uterus Fetal heart tones 147  Disposition:  Discharge disposition: 01-Home or Self Care      Allergies as of 07/06/2023       Reactions   Butorphanol Hives, Itching        Medication List     STOP taking these medications    Azelastine HCl 137 MCG/SPRAY Soln   bromocriptine 2.5 MG tablet Commonly known as: PARLODEL   CALCIUM CITRATE +D PO   diclofenac 50 MG EC tablet Commonly known as: VOLTAREN   RABEprazole 20 MG tablet Commonly known as: ACIPHEX   traZODone 50 MG tablet Commonly known as: DESYREL       TAKE these medications    acetaminophen 325 MG tablet Commonly known as: TYLENOL Take 2 tablets (650 mg total) by mouth every 4 (four) hours as needed for mild pain or moderate pain (for pain scale < 4  OR  temperature  >/=  100.5 F).   albuterol 108 (90 Base) MCG/ACT inhaler Commonly  known as: VENTOLIN HFA Inhale 2 puffs into the lungs every 4 (four) hours as needed for wheezing or shortness of breath.   ferrous sulfate 325 (65 FE) MG EC tablet Take 1 tablet (325 mg total) by mouth every other day.   FUSION PO Take 1 capsule by mouth daily.   ipratropium 0.03 % nasal spray Commonly known as: ATROVENT Place 1 spray into both nostrils in the morning and at bedtime.   metoprolol tartrate 25 MG tablet Commonly known as: LOPRESSOR Take 0.5 tablets (12.5 mg total) by mouth 2 (two) times daily. What changed:  when to take this reasons to take this   oxyCODONE 5 MG immediate release tablet Commonly known as: Roxicodone Take 1 tablet (5 mg total) by mouth every 6 (six) hours as needed for up to 2 days for severe pain (pain score 7-10).   pantoprazole 40 MG tablet Commonly known as: PROTONIX Take 40 mg by mouth daily.   PRENATAL VITAMIN PO Take 1 tablet by mouth daily.   sertraline 100 MG tablet Commonly known as: ZOLOFT Take 150 mg by mouth daily.   valACYclovir 1000 MG tablet Commonly known as: VALTREX Take 1,000 mg by mouth daily.   VITAMIN D PO Take 2,000 Units by mouth every morning.   ZyrTEC Allergy 10 MG Caps Generic drug: Cetirizine HCl Take 10 mg by mouth daily.        Follow-up Information     Jannifer Hick, MD Follow up.   Specialty: Urology Why: Office will call with appointement Contact information: [address] [place] [address] [phone]  [facility]. Schedule an appointment as soon as possible for a visit in 1 week(s).   Specialty: Obstetrics and Gynecology Contact information: [address] [place] [address] [phone]                Signed: Roma Schanz 07/06/2023, 8:02 PM

## 2023-07-06 NOTE — Progress Notes (Signed)
Pt is G5P1 at 16.0wks who is post cystoscopy at [facility] ([facility]).  FHR dopplered at 124bpm. Pts heart rate was in the 80s at the time.  She has no OB complaints at this time.  Patient will be transferred back to [facility] after recovery.  Dr Su Hilt made aware.

## 2023-07-06 NOTE — Anesthesia Procedure Notes (Addendum)
Procedure Name: Intubation Date/Time: 07/06/2023 12:28 PM  Performed by: Sindy Guadeloupe, CRNAPre-anesthesia Checklist: Patient identified, Emergency Drugs available, Suction available, Patient being monitored and Timeout performed Patient Re-evaluated:Patient Re-evaluated prior to induction Oxygen Delivery Method: Circle system utilized Preoxygenation: Pre-oxygenation with 100% oxygen Induction Type: IV induction, Rapid sequence and Cricoid Pressure applied Laryngoscope Size: Mac and 4 Grade View: Grade II Tube type: Oral Tube size: 7.0 mm Number of attempts: 1 Airway Equipment and Method: Stylet Placement Confirmation: ETT inserted through vocal cords under direct vision, positive ETCO2 and breath sounds checked- equal and bilateral Secured at: 21 cm Tube secured with: Tape Dental Injury: Teeth and Oropharynx as per pre-operative assessment

## 2023-07-06 NOTE — Progress Notes (Addendum)
Day of Surgery Subjective: Pt looks well this morning. Pain returning during our visit, but well managed overall  Objective: Vital signs in last 24 hours: Temp:  [97.7 F (36.5 C)-98.3 F (36.8 C)] 98.3 F (36.8 C) (11/15 0645) Pulse Rate:  [61-89] 75 (11/15 0645) Resp:  [16-18] 16 (11/15 0645) BP: (108-129)/(60-79) 122/79 (11/15 0645) SpO2:  [97 %-99 %] 99 % (11/15 0645)  Assessment/Plan: #right ureteral stone #right hydronephrosis  CT A/P shows obstructing right ureteral stone at the level of the UVJ. Non-obstructing 12mm stone in the right kidney.  Transfer to [facility] for cystoscopy with right retrograde pyelogram, right ureteroscopy with laser lithotripsy, and right ureteral stent placement. I reviewed the risk versus benefit with the patient and her husband who joined [place] by phone.  There are inherent risks with any surgery or anesthesia.  They understand that there is possibility of infection, bleeding, ureteral injury requiring extended stent placement, or inability to fully extract stone requiring percutaneous nephrostomy tube.  She has had these procedures before and is comfortable proceeding.  Intake/Output from previous day: 11/14 0701 - 11/15 0700 In: 0  Out: 700 [Urine:700]  Intake/Output this shift: No intake/output data recorded.  Physical Exam:  General: Alert and oriented CV: No cyanosis Lungs: equal chest rise Gu: flank pain  Lab Results: Recent Labs    07/04/23 2158  HGB 9.9*  HCT 31.8*   BMET Recent Labs    07/04/23 2158  NA 130*  K 3.5  CL 105  CO2 15*  GLUCOSE 87  BUN 16  CREATININE 0.93  CALCIUM 8.9     Studies/Results: CT RENAL STONE STUDY  Result Date: 07/06/2023 CLINICAL DATA:  Abdominal/flank pain, stone suspected Pt is 15.[redacted] weeks pregnant. EXAM: CT ABDOMEN AND PELVIS WITHOUT CONTRAST TECHNIQUE: Multidetector CT imaging of the abdomen and pelvis was performed following the standard protocol without IV contrast.  RADIATION DOSE REDUCTION: This exam was performed according to the departmental dose-optimization program which includes automated exposure control, adjustment of the mA and/or kV according to patient size and/or use of iterative reconstruction technique. COMPARISON:  CT abdomen pelvis 01/23/2020 FINDINGS: Lower chest: Cardiac changes suggestive of anemia. Small hiatal hernia. Hepatobiliary: No focal liver abnormality. No gallstones, gallbladder wall thickening, or pericholecystic fluid. No biliary dilatation. Pancreas: Diffusely atrophic. No focal lesion. Otherwise normal pancreatic contour. No surrounding inflammatory changes. No main pancreatic ductal dilatation. Spleen: Normal in size without focal abnormality. Adrenals/Urinary Tract: No adrenal nodule bilaterally. Right nephrolithiasis measuring up to 1.2 cm. Distal right ureterolithiasis measuring of to 0.8 cm with associated moderate hydronephrosis proximally. Enlarged right renal parenchyma compared to the left. Nonspecific trace right perinephric fat stranding. Punctate left nephrolithiasis. No left ureterolithiasis. No left hydronephrosis. The urinary bladder is not well visualized due to decompression and streak artifact originating from the left femoral hardware. Stomach/Bowel: Gastric surgical changes. Colonic bowel resection. Stomach is within normal limits. No evidence of bowel wall thickening or dilatation. The appendix is not definitely identified with no inflammatory changes in the right lower quadrant to suggest acute appendicitis. Vascular/Lymphatic: No abdominal aorta or iliac aneurysm. No abdominal, pelvic, or inguinal lymphadenopathy. Reproductive: Gravid uterus.  No adnexal masses. Other: No intraperitoneal free fluid. No intraperitoneal free gas. No organized fluid collection. Musculoskeletal: No abdominal wall hernia or abnormality. No suspicious lytic or blastic osseous lesions. No acute displaced fracture. Total left hip arthroplasty. At  least moderate degenerative changes of the right hip. IMPRESSION: 1. Obstructive 0.8 cm distal right ureterolithiasis. Recommend  correlation with urinalysis for superimposed infection. 2. Nonobstructive right nephrolithiasis measuring up to 1.2 cm. 3. Nonobstructive punctate left nephrolithiasis. 4. Cardiac changes suggestive of anemia. 5. Small hiatal hernia. 6. Stool throughout the colon-correlate for constipation. 7. Atrophic pancreas. 8. Gravid uterus.  Limited evaluation. 9. Gastric and colonic surgical changes. Electronically Signed   By: Tish Frederickson M.D.   On: 07/06/2023 00:16   US ABDOMEN LIMITED RUQ (LIVER/GB)  Result Date: 07/04/2023 CLINICAL DATA:  Right flank pain. EXAM: ULTRASOUND ABDOMEN LIMITED RIGHT UPPER QUADRANT COMPARISON:  None Available. FINDINGS: Gallbladder: No gallstones or wall thickening visualized (1.5 mm). No sonographic Murphy sign noted by sonographer. Common bile duct: Diameter: N/A   (The common bile duct is not clearly identified.) Liver: No focal lesion identified. Within normal limits in parenchymal echogenicity. Portal vein is patent on color Doppler imaging with normal direction of blood flow towards the liver. Other: None. IMPRESSION: 1. Limited visualization of the common bile duct. 2. Unremarkable ultrasonographic appearance of the gallbladder and liver. Electronically Signed   By: Aram Candela M.D.   On: 07/04/2023 23:54   US RENAL  Result Date: 07/04/2023 CLINICAL DATA:  Right flank pain x7 hours. EXAM: RENAL / URINARY TRACT ULTRASOUND COMPLETE COMPARISON:  None Available. FINDINGS: Right Kidney: Renal measurements: 15.5 cm x 7.6 cm x 6.5 cm = volume: 398.6 mL. Echogenicity within normal limits. No mass is visualized. Moderate severity right-sided hydronephrosis is seen. Left Kidney: Renal measurements: 12.3 cm x 5.7 cm x 5.8 cm = volume: 213.9 mL. Echogenicity within normal limits. No mass or hydronephrosis visualized. Bladder: The urinary bladder is  poorly distended and subsequently limited in evaluation. Other: None. IMPRESSION: Moderate severity right-sided hydronephrosis. Electronically Signed   By: Aram Candela M.D.   On: 07/04/2023 23:02      LOS: 1 day   Elmon Kirschner, NP [facility] Pager: [phone]  07/06/2023, 8:49 AM   I have seen and examined the patient and agree with the above assessment and plan.  I reviewed CT scan with evidence of obstructing distal ureteral stone that she is unlikely to pass. Discussed options including ureteroscopy vs PCN with elective ureteroscopy after she delivers. She elects to proceed with ureteroscopy with ureteral stone extraction. Discussed risks of anesthesia to her and her fetus. Discussed risks of bleeding, infection, pain, possibility requiring further procedures. Discussed that if evidence of infection or if unable to treat stone, would plan for stent placement, possible PCN placement. She agrees with above and consents to procedure.  Matt R. Lyndal Alamillo MD [facility]  Pager: [phone]

## 2023-07-06 NOTE — Op Note (Addendum)
Operative Note  Preoperative diagnosis:  1.  Right ureteral stone  Postoperative diagnosis: 1.  Right ureteral stone  Procedure(s): 1.  Cystoscopy 2. Right ureteroscopy with laser lithotripsy and basket extraction of stones 3. Right retrograde pyelogram 4. Right ureteral stent placement 5. Fluoroscopy with intraoperative interpretation  Surgeon: Jettie Pagan, MD  Assistants:  None  Anesthesia:  General  Complications:  None  EBL:  Minimal  Specimens: 1. Stones for stone analysis (to be done at [facility])  Drains/Catheters: 1.  Right 6Fr x 26cm ureteral stent with a tether string  Intraoperative findings:   Cystoscopy demonstrated no suspicious bladder lesions. Right ureteroscopy demonstrated a large 8 mm right ureteral stone in the distal ureter just proximal to an area of ureteral stenosis.  Successful fragmentation of stone and removal of all fragments within the ureter.  Ureter is now clear of stones.  She does have residual remaining renal stones that are nonobstructing that will be treated after pregnancy. Successful right ureteral stent placement.  Indication:  Sarah Saunders is a 47 y.o. female with a history of urolithiasis who is [redacted] weeks pregnant who presented with right-sided flank pain.  Low-dose CT scan revealed 8 mm stone in the distal ureter with moderate hydronephrosis causing obstruction.  Given that she was unlikely to pass the stone, options were discussed including proceeding with definitive treatment with ureteroscopy with laser lithotripsy which excepted as safe during second trimester or proceeding with nephrostomy tube exchanges and treatment of her stone at a later date.  We discussed risk and benefits including risk of infection, bleeding, injury, radiation exposure to her fetus and she elected to proceed.  Description of procedure: After informed consent was obtained from the patient, the patient was identified and taken to the operating room  and placed in the supine position.  General anesthesia was administered as well as perioperative IV antibiotics.  At the beginning of the case, a time-out was performed to properly identify the patient, the surgery to be performed, and the surgical site.  Sequential compression devices were applied to the lower extremities at the beginning of the case for DVT prophylaxis.  The patient was then placed in the dorsal lithotomy supine position, prepped and draped in sterile fashion.  Fluoroscopy was used judiciously.  Preliminary scout fluoroscopy revealed that there was a 8 mm calcification area at the distal right ureter, which corresponds to the  stone found on the preoperative CT scan. We then passed the 21-French rigid cystoscope through the urethra and into the bladder under vision without any difficulty, noting a normal urethra without strictures.  A systematic evaluation of the bladder revealed no evidence of any suspicious bladder lesions.  Ureteral orifices were in normal position.    Under cystoscopic and flouroscopic guidance, we cannulated the ureteral orifice with a 5-French open-ended ureteral catheter and a gentle retrograde pyelogram was performed, revealing a normal caliber ureter without any filling defects. There was moderate right hydronephrosis of the collecting system. A 0.038 sensor wire was then passed up to the level of the renal pelvis and secured to the drape as a safety wire. The ureteral catheter and cystoscope were removed, leaving the safety wire in place.   A semi-rigid ureteroscope was passed alongside the wire up the distal ureter which appeared normal.  I encountered a large 8 mm stone.  Using a 200 m holmium laser fiber, the stone was fragmented in its entirety and then basket extracted.  Of note, there was a ureteral stenosis  just distal to the level of stone.  There are some scarring in this location likely from prior ureteroscopic stone extraction.  A second 0.038 sensor  wire was passed under direct vision and the semirigid scope was removed. A 2.2 Fr zero tip basket was used to remove the fragments under visual guidance. These were sent for chemical analysis.  We then surveyed the ureter and there were no stone fragments remaining.  We then withdrew the ureteroscope back down the ureter, noting no evidence of any stones along the course of the ureter.  Prior to removing the ureteroscope, we did pass the Glidewire back up to the ureter to the renal pelvis.     Once the ureteroscope was removed, the Glidewire was backloaded through the rigid cystoscope, which was then advanced down the urethra and into the bladder. We then used the Glidewire under direct vision through the rigid cystoscope and under fluoroscopic guidance and passed up a 6-French, 26 cm double-pigtail ureteral stent up ureter, making sure that the proximal and distal ends coiled within the kidney and bladder respectively.  Note that we left a long tether string attached to the distal end of the ureteral stent and it exited the urethral meatus and was secured to the inner thigh with a tegaderm adhesive.  The cystoscope was then advanced back into the bladder under vision.  We were able to see the distal stent coiling nicely within the bladder.  The bladder was then emptied with irrigation solution.  The cystoscope was then removed.    The patient tolerated the procedure well and there was no complication. Patient was awoken from anesthesia and taken to the recovery room in stable condition. I was present and scrubbed for the entirety of the case.  Plan:  Patient will be discharged home.  She will remove her stent by pulling [place] string on Monday morning.  She will follow-up with me in the office with renal ultrasound in 1 month. Once she is assess and fetal heart sounds are ok, it would be fine to discharge home from a urologic perspective.   Matt R. Semir Brill MD [facility]  Pager: [phone]

## 2023-07-06 NOTE — Progress Notes (Signed)
CCC Pre-op Review  Pre-op checklist: To be completed by bedside RN  NPO: Ordered  Labs: NA 130  Consent: Per bedside RN, Pt to sign consent at [facility] per NP  H&P: OB/GYN 11/14  Vitals: WNL  O2 requirements: RA  MAR/PTA review: on 12.5mg  of metoprolol per PTA. Awaiting med rec to confirm  IV: 22g  Floor nurse name:  Saul Fordyce, RN   Additional info:   Fetal heart tones checked Q8

## 2023-07-06 NOTE — Progress Notes (Signed)
Reviewed CT scan with large obstructing distal right ureteral stone, unlikely to pass. Will arrange for ureteroscopy/laser lithotripsy/stent today. Discussed with anesthesia and primary team. Keep NPO.   Matt R. Domonique Cothran MD [facility]  Pager: [phone]

## 2023-07-06 NOTE — Anesthesia Preprocedure Evaluation (Signed)
Anesthesia Evaluation  Patient identified by MRN, date of birth, ID band Patient awake    Reviewed: Allergy & Precautions, H&P , NPO status , Patient's Chart, lab work & pertinent test results  Airway Mallampati: II  TM Distance: >3 FB Neck ROM: Full    Dental no notable dental hx.    Pulmonary neg pulmonary ROS   Pulmonary exam normal breath sounds clear to auscultation       Cardiovascular negative cardio ROS Normal cardiovascular exam Rhythm:Regular Rate:Normal     Neuro/Psych   Anxiety     negative neurological ROS  negative psych ROS   GI/Hepatic negative GI ROS, Neg liver ROS,,,  Endo/Other    Class 3 obesity  Renal/GU Renal disease  negative genitourinary   Musculoskeletal negative musculoskeletal ROS (+)    Abdominal  (+) + obese  Peds negative pediatric ROS (+)  Hematology  (+) Blood dyscrasia, anemia   Anesthesia Other Findings   Reproductive/Obstetrics (+) Pregnancy                             Anesthesia Physical Anesthesia Plan  ASA: 3  Anesthesia Plan: General   Post-op Pain Management:    Induction: Intravenous, Rapid sequence and Cricoid pressure planned  PONV Risk Score and Plan: 3 and Ondansetron, Dexamethasone and Treatment may vary due to age or medical condition  Airway Management Planned: Oral ETT  Additional Equipment:   Intra-op Plan:   Post-operative Plan: Extubation in OR  Informed Consent: I have reviewed the patients History and Physical, chart, labs and discussed the procedure including the risks, benefits and alternatives for the proposed anesthesia with the patient or authorized representative who has indicated his/her understanding and acceptance.     Dental advisory given  Plan Discussed with: CRNA  Anesthesia Plan Comments:        Anesthesia Quick Evaluation

## 2023-07-06 NOTE — Discharge Instructions (Signed)
[facility] °[phone] °Post Ureteroscopy With or Without Stent Instructions ° °Definitions: ° °Ureter: The duct that transports urine from the kidney to the bladder. °Stent:   A plastic hollow tube that is placed into the ureter, from the kidney to the                 bladder to prevent the ureter from swelling shut. ° °GENERAL INSTRUCTIONS: ° °Despite the fact that no skin incisions were used, the area around the ureter and bladder is raw and irritated. The stent is a foreign body which will further irritate the bladder wall. This irritation is manifested by increased frequency of urination, both day and night, and by an increase in the urge to urinate. In some, the urge to urinate is present almost always. Sometimes the urge is strong enough that you may not be able to stop yourself from urinating. The only real cure is to remove the stent and then give time for the bladder wall to heal which can't be done until the danger of the ureter swelling shut has passed, which varies. ° °You may see some blood in your urine while the stent is in place and a few days afterwards. Do not be alarmed, even if the urine was clear for a while. Get off your feet and drink lots of fluids until clearing occurs. If you start to pass clots or don't improve, call us. ° °DIET: °You may return to your normal diet immediately. Because of the raw surface of your bladder, alcohol, spicy foods, acid type foods and drinks with caffeine may cause irritation or frequency and should be used in moderation. To keep your urine flowing freely and to avoid constipation, drink plenty of fluids during the day ( 8-10 glasses ). °Tip: Avoid cranberry juice because it is very acidic. ° °ACTIVITY: °Your physical activity doesn't need to be restricted. However, if you are very active, you may see some blood in your urine. We suggest that you reduce your activity under these circumstances until the bleeding has stopped. ° °BOWELS: °It is  important to keep your bowels regular during the postoperative period. Straining with bowel movements can cause bleeding. A bowel movement every other day is reasonable. Use a mild laxative if needed, such as Milk of Magnesia 2-3 tablespoons, or 2 Dulcolax tablets. Call if you continue to have problems. If you have been taking narcotics for pain, before, during or after your surgery, you may be constipated. Take a laxative if necessary. ° ° °MEDICATION: °You should resume your pre-surgery medications unless told not to. In addition you will often be given an antibiotic to prevent infection. These should be taken as prescribed until the bottles are finished unless you are having an unusual reaction to one of the drugs. ° °PROBLEMS YOU SHOULD REPORT TO US: °· Fevers over 100.5 Fahrenheit. °· Heavy bleeding, or clots ( See above notes about blood in urine ). °· Inability to urinate. °· Drug reactions ( hives, rash, nausea, vomiting, diarrhea ). °· Severe burning or pain with urination that is not improving. ° °FOLLOW-UP: °You will need a follow-up appointment to monitor your progress. Call for this appointment at the number listed above. Usually the first appointment will be about three to fourteen days after your surgery. ° ° ° ° ° °

## 2023-07-06 NOTE — Transfer of Care (Signed)
Immediate Anesthesia Transfer of Care Note  Patient: Sarah Saunders  Procedure(s) Performed: CYSTOSCOPY/RIGHT RETROGRADE URETEROSCOPY/HOLMIUM LASER/STENT PLACEMENT (Right)  Patient Location: PACU  Anesthesia Type:General  Level of Consciousness: awake, alert , and patient cooperative  Airway & Oxygen Therapy: Patient Spontanous Breathing and Patient connected to face mask oxygen  Post-op Assessment: Report given to RN and Post -op Vital signs reviewed and stable  Post vital signs: Reviewed and stable  Last Vitals:  Vitals Value Taken Time  BP 117/80 07/06/23 1340  Temp    Pulse 86 07/06/23 1344  Resp 21 07/06/23 1344  SpO2 100 % 07/06/23 1344  Vitals shown include unfiled device data.  Last Pain:  Vitals:   07/06/23 0947  TempSrc: Oral  PainSc:       Patients Stated Pain Goal: 3 (07/06/23 0900)  Complications: No notable events documented.

## 2023-07-09 ENCOUNTER — Encounter (HOSPITAL_COMMUNITY): Payer: Self-pay | Admitting: Urology

## 2023-07-12 LAB — OB RESULTS CONSOLE HEPATITIS B SURFACE ANTIGEN: Hepatitis B Surface Ag: NEGATIVE

## 2023-07-12 LAB — OB RESULTS CONSOLE GC/CHLAMYDIA
Chlamydia: NEGATIVE
Neisseria Gonorrhea: NEGATIVE

## 2023-07-12 LAB — OB RESULTS CONSOLE RUBELLA ANTIBODY, IGM: Rubella: IMMUNE

## 2023-07-12 LAB — OB RESULTS CONSOLE RPR: RPR: NONREACTIVE

## 2023-07-12 LAB — OB RESULTS CONSOLE HIV ANTIBODY (ROUTINE TESTING): HIV: NONREACTIVE

## 2023-07-12 LAB — HEPATITIS C ANTIBODY: HCV Ab: NEGATIVE

## 2023-07-16 NOTE — Anesthesia Postprocedure Evaluation (Signed)
Anesthesia Post Note  Patient: Sarah Saunders  Procedure(s) Performed: CYSTOSCOPY/RIGHT RETROGRADE URETEROSCOPY/HOLMIUM LASER/STENT PLACEMENT (Right)     Patient location during evaluation: PACU Anesthesia Type: General Level of consciousness: awake and alert Pain management: pain level controlled Vital Signs Assessment: post-procedure vital signs reviewed and stable Respiratory status: spontaneous breathing, nonlabored ventilation and respiratory function stable Cardiovascular status: blood pressure returned to baseline and stable Postop Assessment: no apparent nausea or vomiting Anesthetic complications: no   No notable events documented.  Last Vitals:  Vitals:   07/06/23 1516 07/06/23 2050  BP: (!) 144/73 123/78  Pulse: 75 77  Resp: 20 16  Temp: (!) 36.4 C 36.9 C  SpO2: 100% 98%    Last Pain:  Vitals:   07/06/23 2050  TempSrc: Oral  PainSc:    Pain Goal: Patients Stated Pain Goal: 3 (07/06/23 0900)                 Lowella Curb

## 2023-08-29 ENCOUNTER — Encounter (HOSPITAL_COMMUNITY): Payer: Self-pay | Admitting: Obstetrics and Gynecology

## 2023-08-29 ENCOUNTER — Inpatient Hospital Stay (HOSPITAL_COMMUNITY)
Admission: AD | Admit: 2023-08-29 | Discharge: 2023-08-30 | Disposition: A | Discharge status: Home / Self Care | Payer: BC Managed Care – PPO | Attending: Obstetrics and Gynecology | Admitting: Obstetrics and Gynecology

## 2023-08-29 DIAGNOSIS — Z3A23 23 weeks gestation of pregnancy: Secondary | ICD-10-CM | POA: Insufficient documentation

## 2023-08-29 DIAGNOSIS — O09212 Supervision of pregnancy with history of pre-term labor, second trimester: Secondary | ICD-10-CM | POA: Diagnosis not present

## 2023-08-29 DIAGNOSIS — O26892 Other specified pregnancy related conditions, second trimester: Secondary | ICD-10-CM | POA: Diagnosis present

## 2023-08-29 DIAGNOSIS — N200 Calculus of kidney: Secondary | ICD-10-CM | POA: Diagnosis not present

## 2023-08-29 DIAGNOSIS — R82998 Other abnormal findings in urine: Secondary | ICD-10-CM

## 2023-08-29 DIAGNOSIS — Z87442 Personal history of urinary calculi: Secondary | ICD-10-CM | POA: Diagnosis not present

## 2023-08-29 DIAGNOSIS — O99212 Obesity complicating pregnancy, second trimester: Secondary | ICD-10-CM | POA: Insufficient documentation

## 2023-08-29 DIAGNOSIS — R1032 Left lower quadrant pain: Secondary | ICD-10-CM | POA: Diagnosis not present

## 2023-08-29 DIAGNOSIS — O99342 Other mental disorders complicating pregnancy, second trimester: Secondary | ICD-10-CM | POA: Insufficient documentation

## 2023-08-29 DIAGNOSIS — O99282 Endocrine, nutritional and metabolic diseases complicating pregnancy, second trimester: Secondary | ICD-10-CM | POA: Diagnosis not present

## 2023-08-29 DIAGNOSIS — N949 Unspecified condition associated with female genital organs and menstrual cycle: Secondary | ICD-10-CM

## 2023-08-29 DIAGNOSIS — N1330 Unspecified hydronephrosis: Secondary | ICD-10-CM

## 2023-08-29 MED ORDER — HYOSCYAMINE SULFATE 0.125 MG SL SUBL
0.1250 mg | SUBLINGUAL_TABLET | Freq: Once | SUBLINGUAL | Status: AC
  Administered 2023-08-30: 0.125 mg via SUBLINGUAL
  Filled 2023-08-29: qty 1

## 2023-08-29 NOTE — MAU Note (Signed)
..  Sarah Saunders is a 48 y.o. at [redacted]w[redacted]d here in MAU reporting: pain in lower abdomen that began around 9pm, feels like someone is squeezing my left ovary Denies vaginal bleeding or leaking of fluid. +FM  Pain score: 8/10 Vitals:   08/29/23 2223  BP: (!) 113/51  Pulse: 80  Resp: 18  Temp: 97.9 F (36.6 C)  SpO2: 100%     FHT:155 Lab orders placed from triage:  UA

## 2023-08-29 NOTE — MAU Provider Note (Addendum)
 Chief Complaint:  Abdominal Pain   Event Date/Time   First Provider Initiated Contact with Patient 08/29/23 2253     HPI: Sarah Saunders is a 48 y.o. [id number] at 56w5dwho presents to maternity admissions reporting pain in left lower abdomen since 9pm   Feels like ovary is being squeezed.  Has history of renal stones and recently had surgery on right.  Stent has been removed. . She reports good fetal movement, denies LOF, vaginal bleeding, vaginal itching/burning, urinary symptoms, h/a, dizziness, n/v, diarrhea, constipation or fever/chills.  She denies headache, visual changes or RUQ abdominal pain.  Abdominal Pain This is a new problem. The current episode started today. The problem occurs constantly. The problem has been unchanged. The pain is located in the LLQ. The abdominal pain does not radiate. Pertinent negatives include no diarrhea, dysuria, fever, frequency or myalgias. Nothing aggravates the pain. The pain is relieved by Nothing. She has tried nothing for the symptoms.   RN note: .Sarah Saunders is a 48 y.o. at [redacted]w[redacted]d here in MAU reporting: pain in lower abdomen that began around 9pm, feels like someone is squeezing my left ovary Denies vaginal bleeding or leaking of fluid. +FM  Past Medical History: Past Medical History:  Diagnosis Date   Anxiety    Morbid obesity (HCC) 10/09/2016   Pituitary tumor    Polycystic ovary disease    Prolactinoma (HCC)     Past obstetric history: OB History  Gravida Para Term Preterm AB Living  5 2 1 1 2 1   SAB IAB Ectopic Multiple Live Births  0 1 1 0     # Outcome Date GA Lbr Len/2nd Weight Sex Type Anes PTL Lv  5 Current           4 Preterm 06/20/22 [redacted]w[redacted]d  1151 g M Vag-Breech Other  FD     Birth Comments: extra digit left hand no bone involvement  3 IAB      Biochemical     2 Ectopic           1 Term      Vag-Spont       Past Surgical History: Past Surgical History:  Procedure Laterality Date    CYSTOSCOPY/URETEROSCOPY/HOLMIUM LASER/STENT PLACEMENT Right 07/06/2023   Procedure: CYSTOSCOPY/RIGHT RETROGRADE URETEROSCOPY/HOLMIUM LASER/STENT PLACEMENT;  Surgeon: Selma Donnice SAUNDERS, MD;  Location: [facility];  Service: Urology;  Laterality: Right;   JOINT REPLACEMENT Left 01/13/2021   hip   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)     TUBAL LIGATION     right fallopian tube removed   tube removed      Family History: Family History  Problem Relation Age of Onset   Hypertension Father    Heart failure Father    Hypertension Mother    Heart failure Brother     Social History: Social History   Tobacco Use   Smoking status: Never   Smokeless tobacco: Never  Vaping Use   Vaping status: Never Used  Substance Use Topics   Alcohol  use: No   Drug use: No    Allergies:  Allergies  Allergen Reactions   Butorphanol Hives and Itching    Meds:  Medications Prior to Admission  Medication Sig Dispense Refill Last Dose/Taking   acetaminophen  (TYLENOL ) 325 MG tablet Take 2 tablets (650 mg total) by mouth every 4 (four) hours as needed for mild pain or moderate pain (for pain scale < 4  OR  temperature  >/=  100.5 F). 30  tablet 1 Past Month   Cetirizine HCl (ZYRTEC ALLERGY) 10 MG CAPS Take 10 mg by mouth daily.   08/28/2023   Fe Fum-Fe Poly-Vit C-Lactobac (FUSION PO) Take 1 capsule by mouth daily.   08/28/2023   ferrous sulfate  325 (65 FE) MG EC tablet Take 1 tablet (325 mg total) by mouth every other day. 30 tablet 1 08/28/2023   ipratropium (ATROVENT ) 0.03 % nasal spray Place 1 spray into both nostrils in the morning and at bedtime.   08/28/2023   pantoprazole  (PROTONIX ) 40 MG tablet Take 40 mg by mouth daily.   08/29/2023   Prenatal Vit-Fe Fumarate-FA (PRENATAL VITAMIN PO) Take 1 tablet by mouth daily.   08/28/2023   sertraline  (ZOLOFT ) 100 MG tablet Take 150 mg by mouth daily.    08/28/2023   albuterol  (PROVENTIL  HFA;VENTOLIN  HFA) 108 (90 BASE) MCG/ACT inhaler Inhale 2  puffs into the lungs every 4 (four) hours as needed for wheezing or shortness of breath.   More than a month   metoprolol  tartrate (LOPRESSOR ) 25 MG tablet Take 0.5 tablets (12.5 mg total) by mouth 2 (two) times daily. (Patient taking differently: Take 12.5 mg by mouth 2 (two) times daily as needed (svt flare ups).) 90 tablet 3 Unknown   valACYclovir (VALTREX) 1000 MG tablet Take 1,000 mg by mouth daily.      VITAMIN D PO Take 2,000 Units by mouth every morning.       I have reviewed patient's Past Medical Hx, Surgical Hx, Family Hx, Social Hx, medications and allergies.   ROS:  Review of Systems  Constitutional:  Negative for fever.  Gastrointestinal:  Positive for abdominal pain. Negative for diarrhea.  Genitourinary:  Negative for dysuria and frequency.  Musculoskeletal:  Negative for myalgias.   Other systems negative  Physical Exam  Patient Vitals for the past 24 hrs:  BP Temp Temp src Pulse Resp SpO2 Height Weight  08/29/23 2223 (!) 113/51 97.9 F (36.6 C) Oral 80 18 100 % 5' 7 (1.702 m) 112.8 kg   Constitutional: Well-developed, well-nourished female in no acute distress.  Cardiovascular: normal rate  Respiratory: normal effort GI: Abd soft, mildly tender LLQ, gravid appropriate for gestational age.   No rebound or guarding. MS: Extremities nontender, no edema, normal ROM Neurologic: Alert and oriented x 4.  GU: Neg CVAT.  PELVIC EXAM: Closed/soft/50% (states has been 2.2cm long)/ballot/small parts     FHT:  Baseline 135 , moderate variability, accelerations present, no decelerations Contractions: intermittent irritability   Labs: Results for orders placed or performed during the hospital encounter of 08/29/23 (from the past 24 hours)  Urinalysis, Routine w reflex microscopic -Urine, Clean Catch     Status: Abnormal   Collection Time: 08/29/23 11:09 PM  Result Value Ref Range   Color, Urine YELLOW YELLOW   APPearance HAZY (A) CLEAR   Specific Gravity, Urine 1.012  1.005 - 1.030   pH 6.0 5.0 - 8.0   Glucose, UA NEGATIVE NEGATIVE mg/dL   Hgb urine dipstick SMALL (A) NEGATIVE   Bilirubin Urine NEGATIVE NEGATIVE   Ketones, ur NEGATIVE NEGATIVE mg/dL   Protein, ur NEGATIVE NEGATIVE mg/dL   Nitrite NEGATIVE NEGATIVE   Leukocytes,Ua MODERATE (A) NEGATIVE   RBC / HPF 11-20 0 - 5 RBC/hpf   WBC, UA 11-20 0 - 5 WBC/hpf   Bacteria, UA MANY (A) NONE SEEN   Squamous Epithelial / HPF 11-20 0 - 5 /HPF   Mucus PRESENT     --/--/O POS (11/14 0045)  Imaging:  US  RENAL Result Date: 08/30/2023 CLINICAL DATA:  [id number] with left lower quadrant pain. EXAM: RENAL / URINARY TRACT ULTRASOUND COMPLETE COMPARISON:  Renal ultrasound 08/02/2023, CT without contrast 07/05/2023 FINDINGS: Right Kidney: Renal measurements: 12.6 x 6.1 x 6.3 cm = volume: 250.3 mL. Echogenicity within normal limits. No mass is seen. There is moderate hydronephrosis, increased from 08/02/2023 when the hydronephrosis was mild. A 1 cm calyceal nonobstructive stone is again noted in the inferior pole of this kidney. Left Kidney: Renal measurements: 13.3 x 6.3 x 5.1 cm = volume: 226.1 mL. Echogenicity within normal limits. No mass, stones or hydronephrosis visualized. Bladder: Appears normal for degree of bladder distention. Bilateral ureteral jets are noted. Other: Unremarkable left ovary measuring 3.3 x 1.93.3 cm, was roughly in the area of the patient's left lower quadrant pain. There is preservation of color flow to this ovary. IMPRESSION: 1. Moderate right hydronephrosis, increased from 08/02/2023 when the hydronephrosis was mild. 2. 1 cm nonobstructive stone inferior pole of the right kidney. 3. Unremarkable left kidney and bladder with bilateral ureteral jets seen. 4. Unremarkable left ovary. Electronically Signed   By: Francis Quam M.D.   On: 08/30/2023 01:24     MAU Course/MDM: I have reviewed the triage vital signs and the nursing notes.   Pertinent labs & imaging results that were available during  my care of the patient were reviewed by me and considered in my medical decision making (see chart for details).      I have reviewed her medical records including past results, notes and treatments.   I have ordered labs and reviewed results. UA is suspicious for infection.  Given history will treat while waiting on culture NST reviewed Treatments in MAU included Levsin  for pain, Will get renal US  to assess left stones (known on left).    Discussed results.  WIll treat presumptively for UTI while culture pending Pain may also be round ligament, Pt stated while turning to side on US  table, pain changed with position.   Assessment: Single IUP at [redacted]w[redacted]d History of preterm delivery History of renal stones with recent surgical removal Leaukocytes in urine Possible round ligament pain  Plan: Discharge home Rx Duricef for probable UTI Rx Diflucan  for post antibiotic yeast Hydration and follow up with Urology Preterm Labor precautions and fetal kick counts Follow up in Office for prenatal visits and recheck Encouraged to return if she develops worsening of symptoms, increase in pain, fever, or other concerning symptoms.   Pt stable at time of discharge.  Earnie Pouch CNM, MSN Certified Nurse-Midwife 08/29/2023 10:53 PM

## 2023-08-30 ENCOUNTER — Inpatient Hospital Stay (HOSPITAL_COMMUNITY): Payer: BC Managed Care – PPO

## 2023-08-30 DIAGNOSIS — O26892 Other specified pregnancy related conditions, second trimester: Secondary | ICD-10-CM

## 2023-08-30 DIAGNOSIS — N949 Unspecified condition associated with female genital organs and menstrual cycle: Secondary | ICD-10-CM

## 2023-08-30 DIAGNOSIS — N200 Calculus of kidney: Secondary | ICD-10-CM

## 2023-08-30 DIAGNOSIS — N1330 Unspecified hydronephrosis: Secondary | ICD-10-CM

## 2023-08-30 DIAGNOSIS — R1032 Left lower quadrant pain: Secondary | ICD-10-CM

## 2023-08-30 DIAGNOSIS — R82998 Other abnormal findings in urine: Secondary | ICD-10-CM

## 2023-08-30 DIAGNOSIS — Z3A23 23 weeks gestation of pregnancy: Secondary | ICD-10-CM

## 2023-08-30 LAB — URINALYSIS, ROUTINE W REFLEX MICROSCOPIC
Bilirubin Urine: NEGATIVE
Glucose, UA: NEGATIVE mg/dL
Ketones, ur: NEGATIVE mg/dL
Nitrite: NEGATIVE
Protein, ur: NEGATIVE mg/dL
Specific Gravity, Urine: 1.012 (ref 1.005–1.030)
pH: 6 (ref 5.0–8.0)

## 2023-08-30 MED ORDER — CEFADROXIL 500 MG PO CAPS: 500.0000 mg | ORAL_CAPSULE | Freq: Two times a day (BID) | ORAL | 0 refills | Status: AC

## 2023-08-30 MED ORDER — FLUCONAZOLE 150 MG PO TABS: 150.0000 mg | ORAL_TABLET | Freq: Every day | ORAL | 0 refills | Status: DC | PRN

## 2023-08-31 LAB — CULTURE, OB URINE: Culture: NO GROWTH

## 2023-09-01 ENCOUNTER — Inpatient Hospital Stay: Payer: BC Managed Care – PPO | Admitting: Hematology

## 2023-09-05 NOTE — Progress Notes (Signed)
Cardiology Office Note:  .   Date:  09/06/2023  ID:  Sarah Saunders, DOB [birth year], MRN [id number] PCP: Georgianne Fick, MD  [facility] Providers Cardiologist:  Elder Negus, MD {  History of Present Illness: .   Sarah Saunders is a 47 y.o. female with past medical history of paroxysmal supraventricular tachycardia when she was pregnant post embryo transfer with IVF on 01/04/2022 here for follow-up appointment.  Patient had not been taking metoprolol regularly.  In spite of that she had only 2 perceived episodes of PSVT with 1 lasting for a few seconds in the elbow lasting for up to 4 minutes.  Otherwise doing well and was expected to have the baby January 2023.  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  Recently, she noticed increased frequency and severity of her symptoms.  She was in the ER May 2023.  Patient was at home and had gotten up to use the restroom around 2:30 AM.  Started having rapid palpitations that did not improve after vagal maneuvers.  Episode lasted about 20 minutes.  She did call EMS and SVT was noted on EKG.  This resolved after 6 mg of adenosine.  Patient likely had not had any reoccurring symptoms since then.  Started on metoprolol tartrate in the ER which she was taking 25 mg daily.  She was drinking 1 cup of caffeinated coffee but now drinks 1 cup of decaf.  Of note patient underwent embryo transfer for in vitro fertilization 01/04/2022 and this was the patient's second pregnancy with her first child being 53 years old.  IVF was managed by Dr. Assunta Curtis.  From early trends, it appears that IVF was successful and patient was pregnant.  Today, she presents as a 25-week pregnant individual with a history of SVT and anemia  for a routine check-up. She reports experiencing approximately four episodes of SVT during the current pregnancy, which typically resolve on their own. The patient has been prescribed metoprolol to manage  these episodes, which she takes as needed. She reports that her blood pressure is generally low, and taking the medication often makes her feel unwell.  The patient also has a history of kidney stones and has had two episodes during the current pregnancy. A recent CT scan noted "cardiac changes suggestive of anemia", which has caused the patient some concern. She has a hemoglobin level of 9.9 and is scheduled to see a hematologist for further evaluation.  The patient is also dealing with the emotional trauma of a previous stillbirth. She reports living in a state of constant fear for the current pregnancy, focusing primarily on the baby's survival rather than considering the baby's future or potential. This fear has been a significant source of stress for the patient. She does have a great support community through her church and a strong belief in God.   Reports no shortness of breath nor dyspnea on exertion. Reports no chest pain, pressure, or tightness. No edema, orthopnea, PND.   Discussed the use of AI scribe software for clinical note transcription with the patient, who gave verbal consent to proceed.  ROS: Pertinent ROS in HPI  Studies Reviewed: Marland Kitchen   EKG Interpretation Date/Time:  Thursday September 06 2023 11:04:14 EST Ventricular Rate:  84 PR Interval:  146 QRS Duration:  96 QT Interval:  340 QTC Calculation: 401 R Axis:   36  Text Interpretation: Normal sinus rhythm Normal ECG When compared with ECG of 13-Jan-2022 03:18, PREVIOUS  ECG IS PRESENT Confirmed by Jari Favre [phone]) on 09/06/2023 11:23:27 AM    Echocardiogram 02/10/2022:  Normal LV systolic function with visual EF 55-60%. Left ventricle cavity  is normal in size. Normal left ventricular wall thickness. Normal global  wall motion. Normal diastolic filling pattern, normal LAP.  Mild tricuspid regurgitation. No evidence of pulmonary hypertension.  No prior study for comparison.   Telemetry 01/13/2022 (EMS):  Risk  Assessment/Calculations:        Physical Exam:   VS:  BP 118/80 (BP Location: Left Arm, Patient Position: Sitting, Cuff Size: Large)   Pulse 84   Ht 5\' 7"  (1.702 m)   Wt 250 lb 3.2 oz (113.5 kg)   SpO2 99%   BMI 39.19 kg/m    Wt Readings from Last 3 Encounters:  09/06/23 250 lb 3.2 oz (113.5 kg)  08/29/23 248 lb 11.2 oz (112.8 kg)  07/04/23 252 lb (114.3 kg)    GEN: Well nourished, well developed in no acute distress NECK: No JVD; No carotid bruits CARDIAC: RRR, no murmurs, rubs, gallops RESPIRATORY:  Clear to auscultation without rales, wheezing or rhonchi  ABDOMEN: Soft, non-tender, non-distended EXTREMITIES:  No edema; No deformity   ASSESSMENT AND PLAN: .    Supraventricular Tachycardia (SVT) in Pregnancy Four episodes of SVT during current pregnancy, resolving spontaneously. Patient has PRN Metoprolol but has not needed to take it. Episodes of SVT are more frequent during pregnancy and resolve postpartum. -Continue Metoprolol PRN for SVT episodes. -Consider Echocardiogram postpartum to assess for any structural changes.  Anemia Hemoglobin 9.9, with suggestion of cardiac changes associated with anemia on CT scan. Patient has a history of chronic anemia since age 22. -Referral to Hematology for further evaluation and management. -Consider Echocardiogram postpartum to assess for any structural changes associated with anemia.  Pregnancy [redacted] weeks gestation, following a stillbirth in October 2023. 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 -Continue routine prenatal care with OB/GYN. -Consultation with Pediatric Cardiology for detailed fetal cardiac ultrasound, as a precaution due to maternal cardiac history.    Dispo: She can follow-up in 6 months with Dr. Rosemary Holms   Signed, Sharlene Dory, PA-C

## 2023-09-06 ENCOUNTER — Ambulatory Visit: Payer: BC Managed Care – PPO | Attending: Physician Assistant | Admitting: Physician Assistant

## 2023-09-06 ENCOUNTER — Encounter: Payer: Self-pay | Admitting: Physician Assistant

## 2023-09-06 ENCOUNTER — Other Ambulatory Visit: Payer: Self-pay

## 2023-09-06 VITALS — BP 118/80 | HR 84 | Ht 67.0 in | Wt 250.2 lb

## 2023-09-06 DIAGNOSIS — I471 Supraventricular tachycardia, unspecified: Secondary | ICD-10-CM

## 2023-09-06 MED ORDER — METOPROLOL TARTRATE 25 MG PO TABS: 12.5000 mg | ORAL_TABLET | Freq: Two times a day (BID) | ORAL | 1 refills | Status: DC | PRN

## 2023-09-06 NOTE — Patient Instructions (Addendum)
Medication Instructions:  Your physician recommends that you continue on your current medications as directed. Please refer to the Current Medication list given to you today.  *If you need a refill on your cardiac medications before your next appointment, please call your pharmacy*   Lab Work: None ordered  If you have labs (blood work) drawn today and your tests are completely normal, you will receive your results only by: MyChart Message (if you have MyChart) OR A paper copy in the mail If you have any lab test that is abnormal or we need to change your treatment, we will call you to review the results.   Testing/Procedures: None ordered   Follow-Up: At [facility], you and your health needs are our priority.  As part of our continuing mission to provide you with exceptional heart care, we have created designated Provider Care Teams.  These Care Teams include your primary Cardiologist (physician) and Advanced Practice Providers (APPs -  Physician Assistants and Nurse Practitioners) who all work together to provide you with the care you need, when you need it.  We recommend signing up for the patient portal called "MyChart".  Sign up information is provided on this After Visit Summary.  MyChart is used to connect with patients for Virtual Visits (Telemedicine).  Patients are able to view lab/test results, encounter notes, upcoming appointments, etc.  Non-urgent messages can be sent to your provider as well.   To learn more about what you can do with MyChart, go to [email or web address].    Your next appointment:   6 month(s)  Provider:   Elder Negus, MD     Other Instructions           1st Floor: - Lobby - Registration  - Pharmacy  - Lab - Cafe  2nd Floor: - PV Lab - Diagnostic Testing (echo, CT, nuclear med)  3rd Floor: - Vacant  4th Floor: - TCTS (cardiothoracic surgery) - AFib Clinic - Structural Heart Clinic - Vascular Surgery  -  Vascular Ultrasound  5th Floor: - HeartCare Cardiology (general and EP) - Clinical Pharmacy for coumadin, hypertension, lipid, weight-loss medications, and med management appointments    Valet parking services will be available as well.

## 2023-09-07 ENCOUNTER — Ambulatory Visit (HOSPITAL_COMMUNITY): Payer: BC Managed Care – PPO

## 2023-09-11 ENCOUNTER — Ambulatory Visit (HOSPITAL_COMMUNITY): Payer: BC Managed Care – PPO | Attending: Physician Assistant

## 2023-09-11 DIAGNOSIS — I471 Supraventricular tachycardia, unspecified: Secondary | ICD-10-CM | POA: Diagnosis present

## 2023-09-11 LAB — ECHOCARDIOGRAM COMPLETE
Area-P 1/2: 4.15 cm2
S' Lateral: 3.4 cm

## 2023-09-18 ENCOUNTER — Inpatient Hospital Stay: Payer: BC Managed Care – PPO | Attending: Hematology | Admitting: Hematology

## 2023-09-18 ENCOUNTER — Encounter: Payer: BC Managed Care – PPO | Admitting: Hematology

## 2023-09-18 ENCOUNTER — Inpatient Hospital Stay: Payer: BC Managed Care – PPO

## 2023-09-18 VITALS — BP 137/84 | HR 91 | Temp 97.7°F | Resp 18 | Wt 247.6 lb

## 2023-09-18 DIAGNOSIS — D509 Iron deficiency anemia, unspecified: Secondary | ICD-10-CM | POA: Diagnosis not present

## 2023-09-18 DIAGNOSIS — Z3A26 26 weeks gestation of pregnancy: Secondary | ICD-10-CM | POA: Insufficient documentation

## 2023-09-18 DIAGNOSIS — D649 Anemia, unspecified: Secondary | ICD-10-CM | POA: Diagnosis not present

## 2023-09-18 DIAGNOSIS — O99012 Anemia complicating pregnancy, second trimester: Secondary | ICD-10-CM | POA: Diagnosis present

## 2023-09-18 LAB — CMP (CANCER CENTER ONLY)
ALT: 16 U/L (ref 0–44)
AST: 22 U/L (ref 15–41)
Albumin: 3.4 g/dL — ABNORMAL LOW (ref 3.5–5.0)
Alkaline Phosphatase: 129 U/L — ABNORMAL HIGH (ref 38–126)
Anion gap: 6 (ref 5–15)
BUN: 10 mg/dL (ref 6–20)
CO2: 23 mmol/L (ref 22–32)
Calcium: 9.3 mg/dL (ref 8.9–10.3)
Chloride: 107 mmol/L (ref 98–111)
Creatinine: 0.7 mg/dL (ref 0.44–1.00)
GFR, Estimated: >60 mL/min (ref >60)
Glucose, Bld: 73 mg/dL (ref 70–99)
Potassium: 3.8 mmol/L (ref 3.5–5.1)
Sodium: 136 mmol/L (ref 135–145)
Total Bilirubin: 0.3 mg/dL (ref 0.0–1.2)
Total Protein: 6.9 g/dL (ref 6.5–8.1)

## 2023-09-18 LAB — IRON AND IRON BINDING CAPACITY (CC-WL,HP ONLY)
Iron: 41 ug/dL (ref 28–170)
Saturation Ratios: 7 % — ABNORMAL LOW (ref 10.4–31.8)
TIBC: 609 ug/dL — ABNORMAL HIGH (ref 250–450)
UIBC: 568 ug/dL — ABNORMAL HIGH (ref 148–442)

## 2023-09-18 LAB — CBC WITH DIFFERENTIAL (CANCER CENTER ONLY)
Abs Immature Granulocytes: 0.05 10*3/uL (ref 0.00–0.07)
Basophils Absolute: 0 10*3/uL (ref 0.0–0.1)
Basophils Relative: 0 %
Eosinophils Absolute: 0 10*3/uL (ref 0.0–0.5)
Eosinophils Relative: 0 %
HCT: 30.9 % — ABNORMAL LOW (ref 36.0–46.0)
Hemoglobin: 9.5 g/dL — ABNORMAL LOW (ref 12.0–15.0)
Immature Granulocytes: 1 %
Lymphocytes Relative: 20 %
Lymphs Abs: 1.7 10*3/uL (ref 0.7–4.0)
MCH: 25.1 pg — ABNORMAL LOW (ref 26.0–34.0)
MCHC: 30.7 g/dL (ref 30.0–36.0)
MCV: 81.7 fL (ref 80.0–100.0)
Monocytes Absolute: 0.5 10*3/uL (ref 0.1–1.0)
Monocytes Relative: 6 %
Neutro Abs: 6.2 10*3/uL (ref 1.7–7.7)
Neutrophils Relative %: 73 %
Platelet Count: 202 10*3/uL (ref 150–400)
RBC: 3.78 MIL/uL — ABNORMAL LOW (ref 3.87–5.11)
RDW: 15.7 % — ABNORMAL HIGH (ref 11.5–15.5)
WBC Count: 8.5 10*3/uL (ref 4.0–10.5)
nRBC: 0 % (ref 0.0–0.2)

## 2023-09-18 LAB — VITAMIN B12: Vitamin B-12: 594 pg/mL (ref 180–914)

## 2023-09-18 LAB — T4, FREE: Free T4: 0.69 ng/dL (ref 0.61–1.12)

## 2023-09-18 NOTE — Progress Notes (Signed)
HEMATOLOGY/ONCOLOGY CONSULTATION NOTE  Date of Service: 09/18/2023  Patient Care Team: Georgianne Fick, MD as PCP - General (Internal Medicine) Elder Negus, MD as PCP - Cardiology (Cardiology)  CHIEF COMPLAINTS/PURPOSE OF CONSULTATION:  Anemia  HISTORY OF PRESENTING ILLNESS: (12/13/2016)    Sarah Saunders is a wonderful 48 y.o. female who has been referred to [place] by Dr .Georgianne Fick, MD  for evaluation and management of iron deficiency anemia not responsive to oral iron therapy.    Patient has a history of prolactinoma, polycystic ovarian disease, morbid obesity, previous history of iron deficiency, GERD on chronic PPI therapy who is undergoing evaluation for bariatric surgery with Dr. Delrae Alfred at [facility].   As part of her pre-bariatric surgery evaluation (plan for duodenal switch surgery) she was noted to have zinc deficiency, iron deficiency. She notes that she has been on ferrous sulfate 1 tablet by mouth twice a day since December 2017 with no improvement in her hemoglobin or ferritin levels.     She notes that she has been on AcipHex for "years". She notes no overt GI bleeding. Notes that she has had no menstrual losses for about 5 months and that her periods have been fairly irregular due to her PCOS.   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 Recent labs with her primary care physician  from 10/30/2016 showed a hemoglobin of 9.5 with an MCV of 77 elevated RDW of 18.5 with normal W BC count of 11.2k and platelets of 312k. Iron studies were not available in referable information that she had an iron saturation of about 8% earlier this year.   No other acute new symptoms at this time. She notes that her bariatric medicine team has told her that they cannot proceed for surgical planning until her anemia and iron deficiency are completely corrected.   She notes that she is on zinc and copper  replacement as per her bariatric medicine team.   Has not had any overt evidence of B12 deficiency.   Currently he is taking ferrous sulfate 1 tablet by mouth twice a day with vitamin C without improvement in her anemia. Patient notes that she has never required previous blood transfusions or IV iron.   We discussed about the role of IV iron replacement in details and the possible etiologies for her iron deficiency anemia including poor absorption due to chronic PPI therapy. She has been on high doses of ibuprofen for one acute pains and is also on SSRI and we discussed that this combination could significantly increase her risk of GI bleeding. She was recommended to discuss this with her primary care physician.  HISTORY OF PRESENTING ILLNESS: (09/18/2023) Sarah Saunders is a wonderful 48 y.o. female who has been referred to [place] by CNM Nigel Bridgeman for evaluation and management of Anemia. Hx of chronic anemia, bariatric surgery. She has had iron infusions prior pregnancy.   Labs from 07/15/2023 showed patient is anemic with hgb of 9.9 g/dL with hct of 91%. No recent iron labs.  Patient was previously seen by [place] in 12/13/2016 for iron deficiency anemia. She had IV Injectafer 750 mg weekly for 2 dose for surgeries. She was recommended to follow-up with [place] 8 weeks after initial visit. Patient did not follow-up.  Patient is currently 26 weeks 4d pregnant. She reports this is her 5th pregnancy. Still birth at 25 week pregnancy in 2023, unsure of the cause. G5P1. Patient  notes she has been doing well overall since our last visit. She had her bariatric surgery and she follows-up with her PCP for Vitamin B-12 levels. She had lost around 170 lbs before her 2023 pregnancy.   Current medications: 500 mg Vitamin B-12 sublingually, pre-natal vitamin, and Vitamin D-3 supplement. NU Iron once daily, started by OGYN around one month ago. She has been tolerating NU Iron well without any toxicities. Denies  calcium supplement.   She complains of significant fatigue and chronic hip pain. She also complains of sleep depravation. She occasionally takes Benadryl to help her sleep.   She denies any new infection issues, fever, chills, night sweats, unexpected weight loss, back pain, chest pain, SOB, abdominal pain, or leg swelling.   Patient notes she has been eating well, gained around 13-14 lbs.   FmHx of anemia - mother. Denies any FmHx of blood disorders.   She has been anemic since she was 18. She denies any PmHx of blood disorder.   She has PmHx of kidney stones. She currently has occasional microscopic blood. She currently has 2 kidney stones in her right kidney.   She denies any IV Iron infusions since 2018.   MEDICAL HISTORY:  Past Medical History:  Diagnosis Date   Anxiety    Morbid obesity (HCC) 10/09/2016   Pituitary tumor    Polycystic ovary disease    Prolactinoma (HCC)     SURGICAL HISTORY: Past Surgical History:  Procedure Laterality Date   CYSTOSCOPY/URETEROSCOPY/HOLMIUM LASER/STENT PLACEMENT Right 07/06/2023   Procedure: CYSTOSCOPY/RIGHT RETROGRADE URETEROSCOPY/HOLMIUM LASER/STENT PLACEMENT;  Surgeon: Jannifer Hick, MD;  Location: [facility];  Service: Urology;  Laterality: Right;   JOINT REPLACEMENT Left 01/13/2021   hip   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)     TUBAL LIGATION     right fallopian tube removed   tube removed      SOCIAL HISTORY: Social History   Socioeconomic History   Marital status: Married    Spouse name: Casimiro Needle   Number of children: 1   Years of education: Not on file   Highest education level: Not on file  Occupational History   Occupation: Charity fundraiser    Comment: [employer]  Tobacco Use   Smoking status: Never   Smokeless tobacco: Never  Vaping Use   Vaping status: Never Used  Substance and Sexual Activity   Alcohol use: No   Drug use: No   Sexual activity: Yes    Birth control/protection:  None  Other Topics Concern   Not on file  Social History Narrative   Lives with husband.   Social Drivers of Corporate investment banker Strain: Not on file  Food Insecurity: No Food Insecurity (08/29/2023)   Hunger Vital Sign    Worried About Running Out of Food in the Last Year: Never true    Ran Out of Food in the Last Year: Never true  Transportation Needs: No Transportation Needs (08/29/2023)   PRAPARE - Administrator, Civil Service (Medical): No    Lack of Transportation (Non-Medical): No  Physical Activity: Not on file  Stress: Not on file  Social Connections: Unknown (08/29/2023)   Social Connection and Isolation Panel [NHANES]    Frequency of Communication with Friends and Family: More than three times a week    Frequency of Social Gatherings with Friends and Family: Three times a week    Attends Religious Services: More than 4 times per year    Active  Member of Clubs or Organizations: Patient declined    Attends Banker Meetings: Patient declined    Marital Status: Married  Catering manager Violence: Not At Risk (08/29/2023)   Humiliation, Afraid, Rape, and Kick questionnaire    Fear of Current or Ex-Partner: No    Emotionally Abused: No    Physically Abused: No    Sexually Abused: No    FAMILY HISTORY: Family History  Problem Relation Age of Onset   Hypertension Father    Heart failure Father    Hypertension Mother    Heart failure Brother     ALLERGIES:  is allergic to butorphanol.  MEDICATIONS:  Current Outpatient Medications  Medication Sig Dispense Refill   acetaminophen (TYLENOL) 325 MG tablet Take 2 tablets (650 mg total) by mouth every 4 (four) hours as needed for mild pain or moderate pain (for pain scale < 4  OR  temperature  >/=  100.5 F). 30 tablet 1   albuterol (PROVENTIL HFA;VENTOLIN HFA) 108 (90 BASE) MCG/ACT inhaler Inhale 2 puffs into the lungs every 4 (four) hours as needed for wheezing or shortness of breath.      Cetirizine HCl (ZYRTEC ALLERGY) 10 MG CAPS Take 10 mg by mouth daily.     Fe Fum-Fe Poly-Vit C-Lactobac (FUSION PO) Take 1 capsule by mouth daily.     ferrous sulfate 325 (65 FE) MG EC tablet Take 1 tablet (325 mg total) by mouth every other day. 30 tablet 1   fluconazole (DIFLUCAN) 150 MG tablet Take 1 tablet (150 mg total) by mouth daily as needed (yeast infection). 1 tablet 0   ipratropium (ATROVENT) 0.03 % nasal spray Place 1 spray into both nostrils in the morning and at bedtime.     metoprolol tartrate (LOPRESSOR) 25 MG tablet Take 0.5 tablets (12.5 mg total) by mouth 2 (two) times daily as needed (svt flare ups). 60 tablet 1   pantoprazole (PROTONIX) 40 MG tablet Take 40 mg by mouth daily.     Prenatal Vit-Fe Fumarate-FA (PRENATAL VITAMIN PO) Take 1 tablet by mouth daily.     sertraline (ZOLOFT) 100 MG tablet Take 150 mg by mouth daily.      valACYclovir (VALTREX) 1000 MG tablet Take 1,000 mg by mouth daily.     VITAMIN D PO Take 2,000 Units by mouth every morning.     No current facility-administered medications for this visit.    REVIEW OF SYSTEMS:    10 Point review of Systems was done is negative except as noted above.  PHYSICAL EXAMINATION: ECOG PERFORMANCE STATUS: 1 - Symptomatic but completely ambulatory  . Vitals:   09/18/23 1526  BP: 137/84  Pulse: 91  Resp: 18  Temp: 97.7 F (36.5 C)  SpO2: 99%   Filed Weights   09/18/23 1526  Weight: 247 lb 9.6 oz (112.3 kg)   .Body mass index is 38.78 kg/m.  GENERAL:alert, in no acute distress and comfortable SKIN: no acute rashes, no significant lesions EYES: conjunctiva are pink and non-injected, sclera anicteric OROPHARYNX: MMM, no exudates, no oropharyngeal erythema or ulceration NECK: supple, no JVD LYMPH:  no palpable lymphadenopathy in the cervical, axillary or inguinal regions LUNGS: clear to auscultation b/l with normal respiratory effort HEART: regular rate & rhythm ABDOMEN:  normoactive bowel sounds , non  tender, not distended. Extremity: no pedal edema PSYCH: alert & oriented x 3 with fluent speech NEURO: no focal motor/sensory deficits  LABORATORY DATA:  I have reviewed the data as listed  .  Latest Ref Rng & Units 09/18/2023    4:07 PM 07/04/2023    9:58 PM 05/07/2023    4:39 AM  CBC  WBC 4.0 - 10.5 K/uL 8.5  9.4  7.8   Hemoglobin 12.0 - 15.0 g/dL 9.5  9.9  9.9   Hematocrit 34.0 - 46.6 % 36.0 - 46.0 % 30.6    30.9  31.8  31.7   Platelets 150 - 400 K/uL 202  212  270     .    Latest Ref Rng & Units 09/18/2023    4:07 PM 07/04/2023    9:58 PM 06/19/2022    6:57 PM  CMP  Glucose 70 - 99 mg/dL 73  87  77   BUN 6 - 20 mg/dL 10  16  10    Creatinine 0.44 - 1.00 mg/dL 6.57  8.46  9.62   Sodium 135 - 145 mmol/L 136  130  137   Potassium 3.5 - 5.1 mmol/L 3.8  3.5  4.9   Chloride 98 - 111 mmol/L 107  105  111   CO2 22 - 32 mmol/L 23  15  18    Calcium 8.9 - 10.3 mg/dL 9.3  8.9  9.1   Total Protein 6.5 - 8.1 g/dL 6.9  6.7  6.4   Total Bilirubin 0.0 - 1.2 mg/dL 0.3  0.5  0.6   Alkaline Phos 38 - 126 U/L 129  91  84   AST 15 - 41 U/L 22  23  37   ALT 0 - 44 U/L 16  50  12    . Lab Results  Component Value Date   IRON 41 09/18/2023   TIBC 609 (H) 09/18/2023   IRONPCTSAT 7 (L) 09/18/2023   (Iron and TIBC)  Lab Results  Component Value Date   FERRITIN 11 09/18/2023   Component     Latest Ref Rng 09/18/2023  Folate, Hemolysate     Not Estab. ng/mL 460.0   HCT     34.0 - 46.6 % 30.6 (L)   Folate, RBC     >498 ng/mL 1,503   T4,Free(Direct)     0.61 - 1.12 ng/dL 9.52   TSH     8.413 - 4.500 uIU/mL 1.038   Vitamin B12     180 - 914 pg/mL 594     Legend: (L) Low  RADIOGRAPHIC STUDIES: I have personally reviewed the radiological images as listed and agreed with the findings in the report. ECHOCARDIOGRAM COMPLETE Result Date: 09/11/2023    ECHOCARDIOGRAM REPORT   Patient Name:   NIMRIT KEHRES Date of Exam: 09/11/2023 Medical Rec #:  [id number]         Height:        67.0 in Accession #:    [id number]        Weight:       250.2 lb Date of Birth:  1976/01/06          BSA:          2.224 m Patient Age:    47 years          BP:           118/80 mmHg Patient Gender: F                 HR:           78 bpm. Exam Location:  [address] Procedure: 2D Echo, Color Doppler, Cardiac Doppler, 3D Echo and Strain Analysis Indications:  SVT I47.10; OB Patient  History:        Patient has prior history of Echocardiogram examinations, most                 recent 02/10/2022.  Sonographer:    Thurman Coyer RDCS Referring Phys: [id number] TESSA N CONTE IMPRESSIONS  1. Left ventricular ejection fraction, by estimation, is 55 to 60%. Left ventricular ejection fraction by 3D volume is 60 %. The left ventricle has normal function. The left ventricle has no regional wall motion abnormalities. Left ventricular diastolic  parameters were normal. The average left ventricular global longitudinal strain is -20.4 %. The global longitudinal strain is normal.  2. Right ventricular systolic function is normal. The right ventricular size is normal.  3. The mitral valve is normal in structure. No evidence of mitral valve regurgitation. No evidence of mitral stenosis.  4. The aortic valve is tricuspid. Aortic valve regurgitation is not visualized. No aortic stenosis is present.  5. Moderately dilated pulmonary artery.  6. The inferior vena cava is normal in size with greater than 50% respiratory variability, suggesting right atrial pressure of 3 mmHg. Comparison(s): Unable to view 2023 study for comparison. FINDINGS  Left Ventricle: Left ventricular ejection fraction, by estimation, is 55 to 60%. Left ventricular ejection fraction by 3D volume is 60 %. The left ventricle has normal function. The left ventricle has no regional wall motion abnormalities. The average left ventricular global longitudinal strain is -20.4 %. The global longitudinal strain is normal. The left ventricular internal cavity size was normal  in size. There is no left ventricular hypertrophy. Left ventricular diastolic parameters were normal. Right Ventricle: The right ventricular size is normal. No increase in right ventricular wall thickness. Right ventricular systolic function is normal. Left Atrium: Left atrial size was normal in size. Right Atrium: Right atrial size was normal in size. Pericardium: There is no evidence of pericardial effusion. Mitral Valve: The mitral valve is normal in structure. No evidence of mitral valve regurgitation. No evidence of mitral valve stenosis. Tricuspid Valve: The tricuspid valve is normal in structure. Tricuspid valve regurgitation is trivial. No evidence of tricuspid stenosis. Aortic Valve: The aortic valve is tricuspid. Aortic valve regurgitation is not visualized. No aortic stenosis is present. Pulmonic Valve: The pulmonic valve was normal in structure. Pulmonic valve regurgitation is not visualized. No evidence of pulmonic stenosis. Aorta: The aortic root and ascending aorta are structurally normal, with no evidence of dilitation. Pulmonary Artery: The pulmonary artery is moderately dilated. Venous: The inferior vena cava is normal in size with greater than 50% respiratory variability, suggesting right atrial pressure of 3 mmHg. IAS/Shunts: The atrial septum is grossly normal.  LEFT VENTRICLE PLAX 2D LVIDd:         4.60 cm         Diastology LVIDs:         3.40 cm         LV e' medial:    9.90 cm/s LV PW:         0.80 cm         LV E/e' medial:  6.8 LV IVS:        0.70 cm         LV e' lateral:   14.90 cm/s LVOT diam:     2.30 cm         LV E/e' lateral: 4.5 LV SV:         78 LV SV Index:   35  2D LVOT Area:     4.15 cm        Longitudinal                                Strain                                2D Strain GLS  -18.0 %                                (A2C):                                2D Strain GLS  -20.4 %                                (A3C):                                2D Strain  GLS  -22.7 %                                (A4C):                                2D Strain GLS  -20.4 %                                Avg:                                 3D Volume EF                                LV 3D EF:    Left                                             ventricul                                             ar                                             ejection                                             fraction  by 3D                                             volume is                                             60 %.                                 3D Volume EF:                                3D EF:        60 %                                LV EDV:       138 ml                                LV ESV:       55 ml                                LV SV:        83 ml RIGHT VENTRICLE             IVC RV Basal diam:  3.00 cm     IVC diam: 1.70 cm RV Mid diam:    2.50 cm RV S prime:     13.30 cm/s TAPSE (M-mode): 2.2 cm LEFT ATRIUM             Index        RIGHT ATRIUM           Index LA diam:        3.40 cm 1.53 cm/m   RA Area:     10.30 cm LA Vol (A2C):   48.0 ml 21.58 ml/m  RA Volume:   18.80 ml  8.45 ml/m LA Vol (A4C):   50.3 ml 22.62 ml/m LA Biplane Vol: 49.8 ml 22.39 ml/m  AORTIC VALVE LVOT Vmax:   98.10 cm/s LVOT Vmean:  64.400 cm/s LVOT VTI:    0.187 m  AORTA Ao Root diam: 2.80 cm Ao Asc diam:  3.30 cm MITRAL VALVE MV Area (PHT): 4.15 cm    SHUNTS MV Decel Time: 183 msec    Systemic VTI:  0.19 m MV E velocity: 67.30 cm/s  Systemic Diam: 2.30 cm MV A velocity: 50.50 cm/s MV E/A ratio:  1.33 Riley Lam MD Electronically signed by Riley Lam MD Signature Date/Time: 09/11/2023/3:36:33 PM    Final    US RENAL Result Date: 08/30/2023 CLINICAL DATA:  [id number] with left lower quadrant pain. EXAM: RENAL / URINARY TRACT ULTRASOUND COMPLETE COMPARISON:  Renal ultrasound 08/02/2023, CT without contrast 07/05/2023 FINDINGS: Right  Kidney: Renal measurements: 12.6 x 6.1 x 6.3 cm = volume: 250.3 mL. Echogenicity within normal limits. No mass is seen. There is moderate hydronephrosis, increased from 08/02/2023 when the hydronephrosis  was mild. A 1 cm calyceal nonobstructive stone is again noted in the inferior pole of this kidney. Left Kidney: Renal measurements: 13.3 x 6.3 x 5.1 cm = volume: 226.1 mL. Echogenicity within normal limits. No mass, stones or hydronephrosis visualized. Bladder: Appears normal for degree of bladder distention. Bilateral ureteral jets are noted. Other: Unremarkable left ovary measuring 3.3 x 1.93.3 cm, was roughly in the area of the patient's left lower quadrant pain. There is preservation of color flow to this ovary. IMPRESSION: 1. Moderate right hydronephrosis, increased from 08/02/2023 when the hydronephrosis was mild. 2. 1 cm nonobstructive stone inferior pole of the right kidney. 3. Unremarkable left kidney and bladder with bilateral ureteral jets seen. 4. Unremarkable left ovary. Electronically Signed   By: Almira Bar M.D.   On: 08/30/2023 01:24    ASSESSMENT & PLAN:    Anemia with hgb of 9.9. g/dL with hct of 72%.  Patient is [redacted] weeks pregnant.  Labs today with iron panel.   PLAN: -Discussed with the patient that recent labs from 07/15/2023 showed low hgb of 9.9 g/dL with hct of 53%. No recent iron labs.  -Discussed the next plan is to get additional labs during this visit with iron panel. Pt agrees.  -Discussed the option of IV Iron infusion, IV Venofer, if iron labs show iron-deficiency. Pt agrees. -Answered all of patient's questions.   FOLLOW-UP: Labs today IV iron based on labs RTC with Dr Candise Che with labs in 2 months  .The total time spent in the appointment was 45 minutes* .  All of the patient's questions were answered with apparent satisfaction. The patient knows to call the clinic with any problems, questions or concerns.   Wyvonnia Lora MD MS AAHIVMS [facility]  [facility] Hematology/Oncology Physician [facility]  .*Total Encounter Time as defined by the Centers for Medicare and Medicaid Services includes, in addition to the face-to-face time of a patient visit (documented in the note above) non-face-to-face time: obtaining and reviewing outside history, ordering and reviewing medications, tests or procedures, care coordination (communications with other health care professionals or caregivers) and documentation in the medical record.   09/18/2023 1:45 PM  I,Param Shah,acting as a scribe for Wyvonnia Lora, MD.,have documented all relevant documentation on the behalf of Wyvonnia Lora, MD,as directed by  Wyvonnia Lora, MD while in the presence of Wyvonnia Lora, MD.  .I have reviewed the above documentation for accuracy and completeness, and I agree with the above. Johney Maine MD

## 2023-09-19 ENCOUNTER — Encounter: Payer: Self-pay | Admitting: Physician Assistant

## 2023-09-19 LAB — FOLATE RBC
Folate, Hemolysate: 460 ng/mL
Folate, RBC: 1503 ng/mL (ref >498)
Hematocrit: 30.6 % — ABNORMAL LOW (ref 34.0–46.6)

## 2023-09-19 LAB — TSH: TSH: 1.038 u[IU]/mL (ref 0.350–4.500)

## 2023-09-19 LAB — FERRITIN: Ferritin: 11 ng/mL (ref 11–307)

## 2023-09-20 NOTE — Telephone Encounter (Signed)
I agree with your comments,Sarah Saunders.  There is no evidence of pulmonary hypertension on echocardiogram.  I wish her all the very best with the pregnancy.  Thanks MJP

## 2023-09-24 ENCOUNTER — Encounter: Payer: Self-pay | Admitting: Hematology

## 2023-09-25 ENCOUNTER — Other Ambulatory Visit: Payer: Self-pay

## 2023-09-25 ENCOUNTER — Encounter (HOSPITAL_COMMUNITY): Payer: Self-pay | Admitting: Obstetrics and Gynecology

## 2023-09-25 ENCOUNTER — Inpatient Hospital Stay (HOSPITAL_COMMUNITY)
Admission: AD | Admit: 2023-09-25 | Discharge: 2023-09-26 | DRG: 833 | Disposition: A | Discharge status: Home / Self Care | Payer: BC Managed Care – PPO | Attending: Obstetrics and Gynecology | Admitting: Obstetrics and Gynecology

## 2023-09-25 ENCOUNTER — Inpatient Hospital Stay (HOSPITAL_COMMUNITY): Payer: BC Managed Care – PPO

## 2023-09-25 DIAGNOSIS — O26879 Cervical shortening, unspecified trimester: Secondary | ICD-10-CM | POA: Diagnosis present

## 2023-09-25 DIAGNOSIS — O26892 Other specified pregnancy related conditions, second trimester: Secondary | ICD-10-CM | POA: Diagnosis present

## 2023-09-25 DIAGNOSIS — R102 Pelvic and perineal pain: Secondary | ICD-10-CM | POA: Diagnosis not present

## 2023-09-25 DIAGNOSIS — Z3A27 27 weeks gestation of pregnancy: Secondary | ICD-10-CM

## 2023-09-25 DIAGNOSIS — Z79899 Other long term (current) drug therapy: Secondary | ICD-10-CM

## 2023-09-25 DIAGNOSIS — R109 Unspecified abdominal pain: Secondary | ICD-10-CM | POA: Diagnosis present

## 2023-09-25 DIAGNOSIS — O09522 Supervision of elderly multigravida, second trimester: Secondary | ICD-10-CM | POA: Diagnosis not present

## 2023-09-25 DIAGNOSIS — O09212 Supervision of pregnancy with history of pre-term labor, second trimester: Secondary | ICD-10-CM | POA: Diagnosis not present

## 2023-09-25 DIAGNOSIS — O09899 Supervision of other high risk pregnancies, unspecified trimester: Secondary | ICD-10-CM

## 2023-09-25 DIAGNOSIS — O26872 Cervical shortening, second trimester: Principal | ICD-10-CM | POA: Diagnosis present

## 2023-09-25 DIAGNOSIS — Z8249 Family history of ischemic heart disease and other diseases of the circulatory system: Secondary | ICD-10-CM | POA: Diagnosis not present

## 2023-09-25 DIAGNOSIS — O09219 Supervision of pregnancy with history of pre-term labor, unspecified trimester: Secondary | ICD-10-CM | POA: Diagnosis not present

## 2023-09-25 LAB — URINALYSIS, ROUTINE W REFLEX MICROSCOPIC
Bilirubin Urine: NEGATIVE
Glucose, UA: NEGATIVE mg/dL
Hgb urine dipstick: NEGATIVE
Ketones, ur: NEGATIVE mg/dL
Nitrite: NEGATIVE
Protein, ur: NEGATIVE mg/dL
Specific Gravity, Urine: 1.024 (ref 1.005–1.030)
pH: 5 (ref 5.0–8.0)

## 2023-09-25 LAB — TYPE AND SCREEN
ABO/RH(D): O POS
Antibody Screen: NEGATIVE

## 2023-09-25 LAB — WET PREP, GENITAL
Clue Cells Wet Prep HPF POC: NONE SEEN
Sperm: NONE SEEN
Trich, Wet Prep: NONE SEEN
WBC, Wet Prep HPF POC: >=10 — AB (ref <10)
Yeast Wet Prep HPF POC: NONE SEEN

## 2023-09-25 LAB — CBC
HCT: 29.1 % — ABNORMAL LOW (ref 36.0–46.0)
Hemoglobin: 8.8 g/dL — ABNORMAL LOW (ref 12.0–15.0)
MCH: 25.1 pg — ABNORMAL LOW (ref 26.0–34.0)
MCHC: 30.2 g/dL (ref 30.0–36.0)
MCV: 82.9 fL (ref 80.0–100.0)
Platelets: 190 10*3/uL (ref 150–400)
RBC: 3.51 MIL/uL — ABNORMAL LOW (ref 3.87–5.11)
RDW: 15.9 % — ABNORMAL HIGH (ref 11.5–15.5)
WBC: 9 10*3/uL (ref 4.0–10.5)
nRBC: 0 % (ref 0.0–0.2)

## 2023-09-25 LAB — COMPREHENSIVE METABOLIC PANEL
ALT: 14 U/L (ref 0–44)
AST: 24 U/L (ref 15–41)
Albumin: 2.5 g/dL — ABNORMAL LOW (ref 3.5–5.0)
Alkaline Phosphatase: 124 U/L (ref 38–126)
Anion gap: 9 (ref 5–15)
BUN: 9 mg/dL (ref 6–20)
CO2: 17 mmol/L — ABNORMAL LOW (ref 22–32)
Calcium: 8.9 mg/dL (ref 8.9–10.3)
Chloride: 110 mmol/L (ref 98–111)
Creatinine, Ser: 0.69 mg/dL (ref 0.44–1.00)
GFR, Estimated: >60 mL/min (ref >60)
Glucose, Bld: 88 mg/dL (ref 70–99)
Potassium: 3.9 mmol/L (ref 3.5–5.1)
Sodium: 136 mmol/L (ref 135–145)
Total Bilirubin: <0.2 mg/dL (ref 0.0–1.2)
Total Protein: 6.3 g/dL — ABNORMAL LOW (ref 6.5–8.1)

## 2023-09-25 MED ORDER — BETAMETHASONE SOD PHOS & ACET 6 (3-3) MG/ML IJ SUSP
12.0000 mg | INTRAMUSCULAR | Status: AC
  Administered 2023-09-26: 12 mg via INTRAMUSCULAR

## 2023-09-25 MED ORDER — DOCUSATE SODIUM 100 MG PO CAPS
100.0000 mg | ORAL_CAPSULE | Freq: Every day | ORAL | Status: DC
  Administered 2023-09-26: 100 mg via ORAL
  Filled 2023-09-25: qty 1

## 2023-09-25 MED ORDER — METOPROLOL TARTRATE 12.5 MG HALF TABLET: 12.5000 mg | ORAL_TABLET | Freq: Two times a day (BID) | ORAL | Status: DC | PRN

## 2023-09-25 MED ORDER — PROGESTERONE 200 MG PO CAPS
200.0000 mg | ORAL_CAPSULE | Freq: Every day | ORAL | Status: DC
  Administered 2023-09-25: 200 mg via VAGINAL
  Filled 2023-09-25 (×2): qty 1

## 2023-09-25 MED ORDER — LACTATED RINGERS IV SOLN: INTRAVENOUS | Status: DC

## 2023-09-25 MED ORDER — BETAMETHASONE SOD PHOS & ACET 6 (3-3) MG/ML IJ SUSP
12.0000 mg | Freq: Once | INTRAMUSCULAR | Status: AC
  Administered 2023-09-25: 12 mg via INTRAMUSCULAR
  Filled 2023-09-25: qty 5

## 2023-09-25 MED ORDER — CALCIUM CARBONATE ANTACID 500 MG PO CHEW: 2.0000 | CHEWABLE_TABLET | ORAL | Status: DC | PRN

## 2023-09-25 MED ORDER — SODIUM CHLORIDE 0.9% FLUSH: 3.0000 mL | INTRAVENOUS | Status: DC | PRN

## 2023-09-25 MED ORDER — SERTRALINE HCL 50 MG PO TABS
150.0000 mg | ORAL_TABLET | Freq: Every day | ORAL | Status: DC
Start: 2023-09-26 — End: 2023-09-27
  Administered 2023-09-26: 150 mg via ORAL
  Filled 2023-09-25: qty 3

## 2023-09-25 MED ORDER — PANTOPRAZOLE SODIUM 40 MG PO TBEC: 40.0000 mg | DELAYED_RELEASE_TABLET | Freq: Every day | ORAL | Status: DC | PRN

## 2023-09-25 MED ORDER — LORATADINE 10 MG PO TABS
10.0000 mg | ORAL_TABLET | Freq: Every day | ORAL | Status: DC
  Administered 2023-09-26: 10 mg via ORAL
  Filled 2023-09-25: qty 1

## 2023-09-25 MED ORDER — PRENATAL MULTIVITAMIN CH
1.0000 | ORAL_TABLET | Freq: Every day | ORAL | Status: DC
  Administered 2023-09-26: 1 via ORAL
  Filled 2023-09-25: qty 1

## 2023-09-25 MED ORDER — ACETAMINOPHEN 325 MG PO TABS: 650.0000 mg | ORAL_TABLET | ORAL | Status: DC | PRN

## 2023-09-25 MED ORDER — FERROUS SULFATE 325 (65 FE) MG PO TABS
325.0000 mg | ORAL_TABLET | ORAL | Status: DC
  Filled 2023-09-25: qty 1

## 2023-09-25 MED ORDER — ZOLPIDEM TARTRATE 5 MG PO TABS: 5.0000 mg | ORAL_TABLET | Freq: Every evening | ORAL | Status: DC | PRN

## 2023-09-25 MED ORDER — SODIUM CHLORIDE 0.9% FLUSH: 3.0000 mL | Freq: Two times a day (BID) | INTRAVENOUS | Status: DC

## 2023-09-25 NOTE — H&P (Addendum)
 Date of Service: 09/25/2023  4:56 PM   Expand All Collapse All    History    CSN: [id number]   Arrival date and time: 09/25/23 1552    Event Date/Time   First Provider Initiated Contact with Patient 09/25/23 1656          Chief Complaint  Patient presents with   Abdominal Pain    HPI Sarah Saunders is a 48 y.o. year old G25P1121 female at [redacted]w[redacted]d weeks gestation who presents to MAU reporting at 1100 she started having mid to LT abdominal pain that went away, but came back around 1500 with vaginal pressure. She describes the abdominal pain/pressure as sharp and constant. She describes the vaginal pani/pressure as intermittent and positional. She states at first she thought it was gas pains, so she took Tums and tried to have a BM, but that did not relieve her pain/pressure. She ate some crackers and drank some gingerale, but vomited them all up. She denies any recent SI; been on pelvic rest since December. She receives [facility] with [facility] (V.Latham, CNM). Her pregnancy is complicated by having a h/o PTB at 26 weeks in 2023 and shortened cervix (CL was 1.95 cm on 09/24/23) on Prometrium . Her spouse is present and contributing to the history taking.    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[facility])                 Past Surgical History:  Procedure Laterality Date   CYSTOSCOPY/URETEROSCOPY/HOLMIUM LASER/STENT PLACEMENT Right 07/06/2023    Procedure: CYSTOSCOPY/RIGHT RETROGRADE URETEROSCOPY/HOLMIUM LASER/STENT PLACEMENT;  Surgeon: Selma Donnice SAUNDERS, MD;  Location: [facility];  Service: Urology;  Laterality: Right;   JOINT REPLACEMENT Left 01/13/2021    hip   LAPAROSCOPIC  GASTRIC RESTRICTIVE DUODENAL PROCEDURE (DUODENAL SWITCH)       TUBAL LIGATION        right fallopian tube removed   tube removed                   Family History  Problem Relation Age of Onset   Hypertension Father     Heart failure Father     Hypertension Mother     Heart failure Brother            Social History  Social History        Tobacco Use   Smoking status: Never   Smokeless tobacco: Never  Vaping Use   Vaping status: Never Used  Substance Use Topics   Alcohol  use: No   Drug use: No        Allergies:  Allergies      Allergies  Allergen Reactions   Butorphanol Hives and Itching               Medications Prior to Admission  Medication Sig Dispense Refill  Last Dose/Taking   Cetirizine HCl (ZYRTEC ALLERGY) 10 MG CAPS Take 10 mg by mouth daily.     09/25/2023   ferrous sulfate  325 (65 FE) MG EC tablet Take 1 tablet (325 mg total) by mouth every other day. 30 tablet 1 09/25/2023   Prenatal Vit-Fe Fumarate-FA (PRENATAL VITAMIN PO) Take 1 tablet by mouth daily.     09/25/2023   RABEprazole (ACIPHEX) 20 MG tablet Take 20 mg by mouth daily.     Taking   sertraline  (ZOLOFT ) 100 MG tablet Take 150 mg by mouth daily.      09/25/2023   acetaminophen  (TYLENOL ) 325 MG tablet Take 2 tablets (650 mg total) by mouth every 4 (four) hours as needed for mild pain or moderate pain (for pain scale < 4  OR  temperature  >/=  100.5 F). 30 tablet 1     albuterol  (PROVENTIL  HFA;VENTOLIN  HFA) 108 (90 BASE) MCG/ACT inhaler Inhale 2 puffs into the lungs every 4 (four) hours as needed for wheezing or shortness of breath.         Fe Fum-Fe Poly-Vit C-Lactobac (FUSION PO) Take 1 capsule by mouth daily.         fluconazole  (DIFLUCAN ) 150 MG tablet Take 1 tablet (150 mg total) by mouth daily as needed (yeast infection). (Patient not taking: Reported on 09/25/2023) 1 tablet 0 Not Taking   ipratropium (ATROVENT ) 0.03 % nasal spray Place 1 spray into both nostrils in the morning and at bedtime.          metoprolol  tartrate (LOPRESSOR ) 25 MG tablet Take 0.5 tablets (12.5 mg total) by mouth 2 (two) times daily as needed (svt flare ups). 60 tablet 1     pantoprazole  (PROTONIX ) 40 MG tablet Take 40 mg by mouth daily.         valACYclovir (VALTREX) 1000 MG tablet Take 1,000 mg by mouth daily.         VITAMIN D PO Take 2,000 Units by mouth every morning.                Review of Systems  Constitutional: Negative.   HENT: Negative.    Eyes: Negative.   Respiratory: Negative.    Cardiovascular: Negative.   Gastrointestinal: Negative.   Endocrine: Negative.   Genitourinary:  Positive for pelvic pain (and pressure - constant  vaginal pressure is intermittent and positional) and vaginal discharge (thick, white d/t using vaginal prometrium ).  Musculoskeletal: Negative.   Skin: Negative.   Allergic/Immunologic: Negative.   Neurological: Negative.   Hematological: Negative.   Psychiatric/Behavioral: Negative.      Physical Exam    Blood pressure 133/70, pulse 86, temperature 98.4 F (36.9 C), resp. rate 18.   Physical Exam Vitals and nursing note reviewed. Exam conducted with a chaperone present.  Constitutional:      Appearance: Normal appearance. She is obese.  Cardiovascular:     Rate and Rhythm: Normal rate.  Pulmonary:     Effort: Pulmonary effort is normal.  Abdominal:     Palpations: Abdomen is soft.  Genitourinary:    General: Normal vulva.     Comments: Pelvic exam: External genitalia normal, SE: vaginal walls pink and well rugated, cervix is smooth, pink, no lesions, scant amt of thick, white vaginal d/c -- WP, GC/CT done, cervix visually closed, Uterus is non-tender, S=D, no CMT or friability, no adnexal tenderness.  Dilation: 1.5 Effacement (%): Thick Presentation: Undeterminable Exam by: Ala Cart, CNM  Musculoskeletal:  General: Normal range of motion.  Skin:    General: Skin is warm and dry.  Neurological:     Mental Status: She is alert and  oriented to person, place, and time.  Psychiatric:        Mood and Affect: Mood normal.        Behavior: Behavior normal.        Thought Content: Thought content normal.        Judgment: Judgment normal.      REACTIVE NST - FHR: 130 bpm / moderate variability / accels present / decels absent / TOCO: none   MAU Course  Procedures   MDM CCUA CBC CMP Type & Screen Saline Lock Wet Prep GC/CT -- Results pending  OB MFM Limited U/S Betamethasone  12 mg IM injection   Lab Results Last 24 Hours        Results for orders placed or performed during the hospital encounter of 09/25/23 (from the past 24 hours)  Urinalysis, Routine w reflex microscopic -Urine, Clean Catch     Status: Abnormal    Collection Time: 09/25/23  4:21 PM  Result Value Ref Range    Color, Urine YELLOW YELLOW    APPearance CLOUDY (A) CLEAR    Specific Gravity, Urine 1.024 1.005 - 1.030    pH 5.0 5.0 - 8.0    Glucose, UA NEGATIVE NEGATIVE mg/dL    Hgb urine dipstick NEGATIVE NEGATIVE    Bilirubin Urine NEGATIVE NEGATIVE    Ketones, ur NEGATIVE NEGATIVE mg/dL    Protein, ur NEGATIVE NEGATIVE mg/dL    Nitrite NEGATIVE NEGATIVE    Leukocytes,Ua TRACE (A) NEGATIVE    RBC / HPF 21-50 0 - 5 RBC/hpf    WBC, UA 6-10 0 - 5 WBC/hpf    Bacteria, UA RARE (A) NONE SEEN    Squamous Epithelial / HPF 6-10 0 - 5 /HPF    Mucus PRESENT      Ca Oxalate Crys, UA PRESENT    Wet prep, genital     Status: Abnormal    Collection Time: 09/25/23  6:26 PM    Specimen: Cervix  Result Value Ref Range    Yeast Wet Prep HPF POC NONE SEEN NONE SEEN    Trich, Wet Prep NONE SEEN NONE SEEN    Clue Cells Wet Prep HPF POC NONE SEEN NONE SEEN    WBC, Wet Prep HPF POC >=10 (A) <10    Sperm NONE SEEN    Type and screen     Status: None (Preliminary result)    Collection Time: 09/25/23  7:05 PM  Result Value Ref Range    ABO/RH(D) PENDING      Antibody Screen PENDING      Sample Expiration          09/28/2023,2359 Performed at [facility], [address], [place], [place] [address]    CBC     Status: Abnormal    Collection Time: 09/25/23  7:06 PM  Result Value Ref Range    WBC 9.0 4.0 - 10.5 K/uL    RBC 3.51 (L) 3.87 - 5.11 MIL/uL    Hemoglobin 8.8 (L) 12.0 - 15.0 g/dL    HCT 70.8 (L) 63.9 - 46.0 %    MCV 82.9 80.0 - 100.0 fL    MCH 25.1 (L) 26.0 - 34.0 pg    MCHC 30.2 30.0 - 36.0 g/dL    RDW 84.0 (H) 88.4 - 15.5 %    Platelets 190 150 -  400 K/uL    nRBC 0.0 0.0 - 0.2 %  Comprehensive metabolic panel     Status: Abnormal    Collection Time: 09/25/23  7:06 PM  Result Value Ref Range    Sodium 136 135 - 145 mmol/L    Potassium 3.9 3.5 - 5.1 mmol/L    Chloride 110 98 - 111 mmol/L    CO2 17 (L) 22 - 32 mmol/L    Glucose, Bld 88 70 - 99 mg/dL    BUN 9 6 - 20 mg/dL    Creatinine, Ser 9.30 0.44 - 1.00 mg/dL    Calcium  8.9 8.9 - 10.3 mg/dL    Total Protein 6.3 (L) 6.5 - 8.1 g/dL    Albumin 2.5 (L) 3.5 - 5.0 g/dL    AST 24 15 - 41 U/L    ALT 14 0 - 44 U/L    Alkaline Phosphatase 124 38 - 126 U/L    Total Bilirubin <0.2 0.0 - 1.2 mg/dL    GFR, Estimated >39 >39 mL/min    Anion gap 9 5 - 15      Report given to Earnie Pouch, CNM @ [address] CNM 09/25/2023, 4:56 PM    Returned from US  Cervical length is 2.0cm Other findings within normal limits in this limited OB US    Consulted Dr Henry with presentation, exam findings and results Betamethasone  given at 1910 Will admit overnight for observation and second dose of BMZ tomorrow.    Assessment and Plan  A;  Single IUP at [redacted]w[redacted]d       Preterm cervical effacement       Uterine irritability       History of preterm birth   P:  Admit to [facility]       Routine orders       Betamethasone  series       MD to follow   Pouch Earnie CROME, CNM        Agree with above.  Repeat exam by me at about 2130 was closed to FT/50%/-2.  Revision History

## 2023-09-25 NOTE — MAU Provider Note (Addendum)
 History     CSN: [id number]  Arrival date and time: 09/25/23 1552   Event Date/Time   First Provider Initiated Contact with Patient 09/25/23 1656      Chief Complaint  Patient presents with   Abdominal Pain   HPI [name] is a 48 y.o. year old G60P1121 female at [redacted]w[redacted]d weeks gestation who presents to MAU reporting at 1100 she started having mid to LT abdominal pain that went away, but came back around 1500 with vaginal pressure. She describes the abdominal pain/pressure as sharp and constant. She describes the vaginal pani/pressure as intermittent and positional. She states at first she thought it was gas pains, so she took Tums and tried to have a BM, but that did not relieve her pain/pressure. She ate some crackers and drank some gingerale, but vomited them all up. She denies any recent SI; been on pelvic rest since December. She receives [facility] with [facility] (V.Latham, CNM). Her pregnancy is complicated by having a h/o PTB at 26 weeks in 2023 and shortened cervix (CL was 1.95 cm on 09/24/23) on Prometrium . Her spouse is present and contributing to the history taking.   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[facility]     Past Surgical History:  Procedure Laterality Date   CYSTOSCOPY/URETEROSCOPY/HOLMIUM LASER/STENT PLACEMENT Right 07/06/2023   Procedure: CYSTOSCOPY/RIGHT RETROGRADE URETEROSCOPY/HOLMIUM LASER/STENT PLACEMENT;  Surgeon: Selma Donnice SAUNDERS, MD;  Location: [facility];  Service: Urology;  Laterality: Right;   JOINT REPLACEMENT Left 01/13/2021   hip   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)     TUBAL LIGATION     right fallopian tube removed    tube removed      Family History  Problem Relation Age of Onset   Hypertension Father    Heart failure Father    Hypertension Mother    Heart failure Brother     Social History   Tobacco Use   Smoking status: Never   Smokeless tobacco: Never  Vaping Use   Vaping status: Never Used  Substance Use Topics   Alcohol  use: No   Drug use: No    Allergies:  Allergies  Allergen Reactions   Butorphanol Hives and Itching    Medications Prior to Admission  Medication Sig Dispense Refill Last Dose/Taking   Cetirizine HCl (ZYRTEC ALLERGY) 10 MG CAPS Take 10 mg by mouth daily.   09/25/2023   ferrous sulfate  325 (65 FE) MG EC tablet Take 1 tablet (325 mg total) by mouth every other day. 30 tablet 1 09/25/2023   Prenatal Vit-Fe Fumarate-FA (PRENATAL VITAMIN PO) Take 1 tablet by mouth daily.   09/25/2023   RABEprazole (ACIPHEX) 20 MG tablet Take 20 mg by mouth daily.   Taking   sertraline  (ZOLOFT ) 100 MG tablet Take 150 mg by mouth daily.    09/25/2023   acetaminophen  (TYLENOL ) 325 MG tablet Take 2 tablets (650 mg total) by  mouth every 4 (four) hours as needed for mild pain or moderate pain (for pain scale < 4  OR  temperature  >/=  100.5 F). 30 tablet 1    albuterol  (PROVENTIL  HFA;VENTOLIN  HFA) 108 (90 BASE) MCG/ACT inhaler Inhale 2 puffs into the lungs every 4 (four) hours as needed for wheezing or shortness of breath.      Fe Fum-Fe Poly-Vit C-Lactobac (FUSION PO) Take 1 capsule by mouth daily.      fluconazole  (DIFLUCAN ) 150 MG tablet Take 1 tablet (150 mg total) by mouth daily as needed (yeast infection). (Patient not taking: Reported on 09/25/2023) 1 tablet 0 Not Taking   ipratropium (ATROVENT ) 0.03 % nasal spray Place 1 spray into both nostrils in the morning and at bedtime.      metoprolol  tartrate (LOPRESSOR ) 25 MG tablet Take 0.5 tablets (12.5 mg total) by mouth 2 (two) times daily as needed (svt flare ups). 60 tablet 1    pantoprazole  (PROTONIX ) 40 MG tablet Take 40 mg by mouth daily.       valACYclovir (VALTREX) 1000 MG tablet Take 1,000 mg by mouth daily.      VITAMIN D PO Take 2,000 Units by mouth every morning.       Review of Systems  Constitutional: Negative.   HENT: Negative.    Eyes: Negative.   Respiratory: Negative.    Cardiovascular: Negative.   Gastrointestinal: Negative.   Endocrine: Negative.   Genitourinary:  Positive for pelvic pain (and pressure - constant  vaginal pressure is intermittent and positional) and vaginal discharge (thick, white d/t using vaginal prometrium ).  Musculoskeletal: Negative.   Skin: Negative.   Allergic/Immunologic: Negative.   Neurological: Negative.   Hematological: Negative.   Psychiatric/Behavioral: Negative.     Physical Exam   Blood pressure 133/70, pulse 86, temperature 98.4 F (36.9 C), resp. rate 18.  Physical Exam Vitals and nursing note reviewed. Exam conducted with a chaperone present.  Constitutional:      Appearance: Normal appearance. She is obese.  Cardiovascular:     Rate and Rhythm: Normal rate.  Pulmonary:     Effort: Pulmonary effort is normal.  Abdominal:     Palpations: Abdomen is soft.  Genitourinary:    General: Normal vulva.     Comments: Pelvic exam: External genitalia normal, SE: vaginal walls pink and well rugated, cervix is smooth, pink, no lesions, scant amt of thick, white vaginal d/c -- WP, GC/CT done, cervix visually closed, Uterus is non-tender, S=D, no CMT or friability, no adnexal tenderness.  Dilation: 1.5 Effacement (%): Thick Presentation: Undeterminable Exam by: Ala Cart, CNM  Musculoskeletal:        General: Normal range of motion.  Skin:    General: Skin is warm and dry.  Neurological:     Mental Status: She is alert and oriented to person, place, and time.  Psychiatric:        Mood and Affect: Mood normal.        Behavior: Behavior normal.        Thought Content: Thought content normal.        Judgment: Judgment normal.    REACTIVE NST - FHR: 130 bpm /  moderate variability / accels present / decels absent / TOCO: none  MAU Course  Procedures  MDM CCUA CBC CMP Type & Screen Saline Lock Wet Prep GC/CT -- Results pending  OB MFM Limited U/S Betamethasone  12 mg IM injection  Results for orders placed or performed during the hospital encounter of  09/25/23 (from the past 24 hours)  Urinalysis, Routine w reflex microscopic -Urine, Clean Catch     Status: Abnormal   Collection Time: 09/25/23  4:21 PM  Result Value Ref Range   Color, Urine YELLOW YELLOW   APPearance CLOUDY (A) CLEAR   Specific Gravity, Urine 1.024 1.005 - 1.030   pH 5.0 5.0 - 8.0   Glucose, UA NEGATIVE NEGATIVE mg/dL   Hgb urine dipstick NEGATIVE NEGATIVE   Bilirubin Urine NEGATIVE NEGATIVE   Ketones, ur NEGATIVE NEGATIVE mg/dL   Protein, ur NEGATIVE NEGATIVE mg/dL   Nitrite NEGATIVE NEGATIVE   Leukocytes,Ua TRACE (A) NEGATIVE   RBC / HPF 21-50 0 - 5 RBC/hpf   WBC, UA 6-10 0 - 5 WBC/hpf   Bacteria, UA RARE (A) NONE SEEN   Squamous Epithelial / HPF 6-10 0 - 5 /HPF   Mucus PRESENT    Ca Oxalate Crys, UA PRESENT   Wet prep, genital     Status: Abnormal   Collection Time: 09/25/23  6:26 PM   Specimen: Cervix  Result Value Ref Range   Yeast Wet Prep HPF POC NONE SEEN NONE SEEN   Trich, Wet Prep NONE SEEN NONE SEEN   Clue Cells Wet Prep HPF POC NONE SEEN NONE SEEN   WBC, Wet Prep HPF POC >=10 (A) <10   Sperm NONE SEEN   Type and screen     Status: None (Preliminary result)   Collection Time: 09/25/23  7:05 PM  Result Value Ref Range   ABO/RH(D) PENDING    Antibody Screen PENDING    Sample Expiration      09/28/2023,2359 Performed at [facility], [address] [place], [place] [address]   CBC     Status: Abnormal   Collection Time: 09/25/23  7:06 PM  Result Value Ref Range   WBC 9.0 4.0 - 10.5 K/uL   RBC 3.51 (L) 3.87 - 5.11 MIL/uL   Hemoglobin 8.8 (L) 12.0 - 15.0 g/dL   HCT 70.8 (L) 63.9 - 53.9 %   MCV 82.9 80.0 - 100.0 fL   MCH 25.1 (L)  26.0 - 34.0 pg   MCHC 30.2 30.0 - 36.0 g/dL   RDW 84.0 (H) 88.4 - 84.4 %   Platelets 190 150 - 400 K/uL   nRBC 0.0 0.0 - 0.2 %  Comprehensive metabolic panel     Status: Abnormal   Collection Time: 09/25/23  7:06 PM  Result Value Ref Range   Sodium 136 135 - 145 mmol/L   Potassium 3.9 3.5 - 5.1 mmol/L   Chloride 110 98 - 111 mmol/L   CO2 17 (L) 22 - 32 mmol/L   Glucose, Bld 88 70 - 99 mg/dL   BUN 9 6 - 20 mg/dL   Creatinine, Ser 9.30 0.44 - 1.00 mg/dL   Calcium  8.9 8.9 - 10.3 mg/dL   Total Protein 6.3 (L) 6.5 - 8.1 g/dL   Albumin 2.5 (L) 3.5 - 5.0 g/dL   AST 24 15 - 41 U/L   ALT 14 0 - 44 U/L   Alkaline Phosphatase 124 38 - 126 U/L   Total Bilirubin <0.2 0.0 - 1.2 mg/dL   GFR, Estimated >39 >39 mL/min   Anion gap 9 5 - 15    Report given to Earnie Pouch, CNM @ 2020  Ala Cart, CNM 09/25/2023, 4:56 PM   Returned from US  Cervical length is 2.0cm Other findings within normal limits in this limited OB US   Consulted Dr Henry with  presentation, exam findings and results Betamethasone  given at 1910 Will admit overnight for observation and second dose of BMZ tomorrow.    Assessment and Plan  A;  Single IUP at [redacted]w[redacted]d       Preterm cervical effacement       Uterine irritability       History of preterm birth  P:  Admit to [facility]       Routine orders       Betamethasone  series       MD to follow  Sarah Saunders, CNM

## 2023-09-25 NOTE — MAU Note (Addendum)
.  Sarah Saunders is a 48 y.o. at [redacted]w[redacted]d here in MAU reporting: earlier this morning had pain in mid/left abd went away and then came back lower left abd and started to feel pelvic pressure with it. Pain and pressure is constant.[name] good fetal movement reported. Denies any vag bleeding or leaking. Reports she drank some ginger ale and crackers and she did vomit them up .   LMP:  Onset of complaint: this morning Pain score: 8 Vitals:   09/25/23 1616  BP: 133/70  Pulse: 86  Resp: 18  Temp: 98.4 F (36.9 C)     FHT: 140  Lab orders placed from triage: u/a

## 2023-09-26 ENCOUNTER — Encounter: Payer: Self-pay | Admitting: Physician Assistant

## 2023-09-26 ENCOUNTER — Ambulatory Visit: Payer: BC Managed Care – PPO

## 2023-09-26 LAB — CULTURE, OB URINE: Culture: <10000 — AB

## 2023-09-26 LAB — GC/CHLAMYDIA PROBE AMP (~~LOC~~) NOT AT ARMC
Chlamydia: NEGATIVE
Comment: NEGATIVE
Comment: NORMAL
Neisseria Gonorrhea: NEGATIVE

## 2023-09-26 MED ORDER — DOCUSATE SODIUM 100 MG PO CAPS: 100.0000 mg | ORAL_CAPSULE | Freq: Every day | ORAL | 0 refills | Status: DC

## 2023-09-26 MED ORDER — ESOMEPRAZOLE MAGNESIUM 20 MG PO CPDR
20.0000 mg | DELAYED_RELEASE_CAPSULE | Freq: Every day | ORAL | Status: DC
  Administered 2023-09-26: 20 mg via ORAL
  Filled 2023-09-26: qty 1

## 2023-09-26 MED ORDER — NON FORMULARY: 20.0000 mg | Freq: Every day | Status: DC

## 2023-09-26 MED ORDER — PROGESTERONE 200 MG PO CAPS: 200.0000 mg | ORAL_CAPSULE | Freq: Every day | ORAL | 1 refills | Status: DC

## 2023-09-26 MED ORDER — SODIUM CHLORIDE 0.9% FLUSH: 3.0000 mL | Freq: Two times a day (BID) | INTRAVENOUS | Status: DC

## 2023-09-26 NOTE — Plan of Care (Signed)

## 2023-09-26 NOTE — Plan of Care (Signed)
  Problem: Education: Goal: Knowledge of disease or condition will improve 09/26/2023 1816 by Knute Suzen RAMAN, RN Outcome: Completed/Met 09/26/2023 1129 by Knute Suzen RAMAN, RN Outcome: Progressing Goal: Knowledge of the prescribed therapeutic regimen will improve 09/26/2023 1816 by Knute Suzen RAMAN, RN Outcome: Completed/Met 09/26/2023 1129 by Knute Suzen RAMAN, RN Outcome: Progressing Goal: Individualized Educational Video(s) 09/26/2023 1816 by Knute Suzen RAMAN, RN Outcome: Completed/Met 09/26/2023 1129 by Knute Suzen RAMAN, RN Outcome: Progressing   Problem: Clinical Measurements: Goal: Complications related to the disease process, condition or treatment will be avoided or minimized 09/26/2023 1816 by Knute Suzen RAMAN, RN Outcome: Completed/Met 09/26/2023 1129 by Knute Suzen RAMAN, RN Outcome: Progressing   Problem: Education: Goal: Knowledge of General Education information will improve Description: Including pain rating scale, medication(s)/side effects and non-pharmacologic comfort measures 09/26/2023 1816 by Knute Suzen RAMAN, RN Outcome: Completed/Met 09/26/2023 1129 by Knute Suzen RAMAN, RN Outcome: Progressing   Problem: Health Behavior/Discharge Planning: Goal: Ability to manage health-related needs will improve 09/26/2023 1816 by Knute Suzen RAMAN, RN Outcome: Completed/Met 09/26/2023 1129 by Knute Suzen RAMAN, RN Outcome: Progressing   Problem: Clinical Measurements: Goal: Ability to maintain clinical measurements within normal limits will improve 09/26/2023 1816 by Knute Suzen RAMAN, RN Outcome: Completed/Met 09/26/2023 1129 by Knute Suzen RAMAN, RN Outcome: Progressing Goal: Will remain free from infection 09/26/2023 1816 by Knute Suzen RAMAN, RN Outcome: Completed/Met 09/26/2023 1129 by Knute Suzen RAMAN, RN Outcome: Progressing Goal: Diagnostic test results will improve 09/26/2023 1816 by Knute Suzen RAMAN, RN Outcome: Completed/Met 09/26/2023 1129 by Knute Suzen RAMAN, RN Outcome: Progressing Goal: Respiratory complications will improve 09/26/2023 1816 by Knute Suzen RAMAN, RN Outcome: Completed/Met 09/26/2023 1129 by Knute Suzen RAMAN, RN Outcome: Progressing Goal: Cardiovascular complication will be avoided 09/26/2023 1816 by Knute Suzen RAMAN, RN Outcome: Completed/Met 09/26/2023 1129 by Knute Suzen RAMAN, RN Outcome: Progressing   Problem: Activity: Goal: Risk for activity intolerance will decrease 09/26/2023 1816 by Knute Suzen RAMAN, RN Outcome: Completed/Met 09/26/2023 1129 by Knute Suzen RAMAN, RN Outcome: Progressing   Problem: Nutrition: Goal: Adequate nutrition will be maintained 09/26/2023 1816 by Knute Suzen RAMAN, RN Outcome: Completed/Met 09/26/2023 1129 by Knute Suzen RAMAN, RN Outcome: Progressing   Problem: Coping: Goal: Level of anxiety will decrease 09/26/2023 1816 by Knute Suzen RAMAN, RN Outcome: Completed/Met 09/26/2023 1129 by Knute Suzen RAMAN, RN Outcome: Progressing   Problem: Elimination: Goal: Will not experience complications related to bowel motility 09/26/2023 1816 by Knute Suzen RAMAN, RN Outcome: Completed/Met 09/26/2023 1129 by Knute Suzen RAMAN, RN Outcome: Progressing Goal: Will not experience complications related to urinary retention 09/26/2023 1816 by Knute Suzen RAMAN, RN Outcome: Completed/Met 09/26/2023 1129 by Knute Suzen RAMAN, RN Outcome: Progressing   Problem: Pain Managment: Goal: General experience of comfort will improve and/or be controlled 09/26/2023 1816 by Knute Suzen RAMAN, RN Outcome: Completed/Met 09/26/2023 1129 by Knute Suzen RAMAN, RN Outcome: Progressing   Problem: Safety: Goal: Ability to remain free from injury will improve 09/26/2023 1816 by Knute Suzen RAMAN, RN Outcome: Completed/Met 09/26/2023 1129 by Knute Suzen RAMAN, RN Outcome: Progressing   Problem: Skin Integrity: Goal: Risk for impaired skin integrity will decrease 09/26/2023 1816 by Knute Suzen RAMAN,  RN Outcome: Completed/Met 09/26/2023 1129 by Knute Suzen RAMAN, RN Outcome: Progressing

## 2023-09-26 NOTE — Discharge Summary (Signed)
 Physician Discharge Summary  Patient ID: Sarah Saunders MRN: [id number] DOB/AGE: 1976/03/30 48 y.o.  Admit date: 09/25/2023 Discharge date: 09/26/2023  Admission Diagnoses: abdominal pain  Short cervix \ On progesterone  27 weeks  Discharge Diagnoses:  Principal Problem:   Short cervix affecting pregnancy   Discharged Condition: good  Hospital Course: Pt was admitted with a known short cervix and abdbominal pain.   Thought to be 1.5 cm ddilated on admission.  Recheck showed no dilation.  Pt feels better and will  Consults:     Significant Diagnostic Studies:   Treatments: IV hydration and steroids: BMZ  Discharge Exam: Blood pressure 115/62, pulse 83, temperature 98.4 F (36.9 C), temperature source Oral, resp. rate 16, SpO2 98%. General appearance: alert and cooperative Resp: clear to auscultation bilaterally Cardio: regular rate and rhythm Extremities: Homans sign is negative, no sign of DVT ABDOMEN SOFT NT GRAVID Disposition: Discharge disposition: 01-Home or Self Care        Allergies as of 09/26/2023       Reactions   Butorphanol Hives, Itching        Medication List     STOP taking these medications    fluconazole  150 MG tablet Commonly known as: Diflucan        TAKE these medications    acetaminophen  325 MG tablet Commonly known as: TYLENOL  Take 2 tablets (650 mg total) by mouth every 4 (four) hours as needed for mild pain or moderate pain (for pain scale < 4  OR  temperature  >/=  100.5 F).   albuterol  108 (90 Base) MCG/ACT inhaler Commonly known as: VENTOLIN  HFA Inhale 2 puffs into the lungs every 4 (four) hours as needed for wheezing or shortness of breath.   docusate sodium  100 MG capsule Commonly known as: COLACE Take 1 capsule (100 mg total) by mouth daily. Start taking on: September 27, 2023   ferrous sulfate  325 (65 FE) MG EC tablet Take 1 tablet (325 mg total) by mouth every other day.   FUSION PO Take 1 capsule by mouth  daily.   ipratropium 0.03 % nasal spray Commonly known as: ATROVENT  Place 1 spray into both nostrils in the morning and at bedtime.   metoprolol  tartrate 25 MG tablet Commonly known as: LOPRESSOR  Take 0.5 tablets (12.5 mg total) by mouth 2 (two) times daily as needed (svt flare ups).   pantoprazole  40 MG tablet Commonly known as: PROTONIX  Take 40 mg by mouth daily.   PRENATAL VITAMIN PO Take 1 tablet by mouth daily.   progesterone  200 MG capsule Commonly known as: PROMETRIUM  Place 1 capsule (200 mg total) vaginally at bedtime.   RABEprazole 20 MG tablet Commonly known as: ACIPHEX Take 20 mg by mouth daily.   sertraline  100 MG tablet Commonly known as: ZOLOFT  Take 150 mg by mouth daily.   valACYclovir 1000 MG tablet Commonly known as: VALTREX Take 1,000 mg by mouth daily.   VITAMIN D PO Take 2,000 Units by mouth every morning.   ZyrTEC Allergy 10 MG Caps Generic drug: Cetirizine HCl Take 10 mg by mouth daily.       NST reassuring DC HOME ON PPELVIC REST [facility] AND PTL PRECAUTIONS GIVEN  Signed: Hesper Venturella A Masa Lubin 09/26/2023, 3:20 PM

## 2023-09-27 ENCOUNTER — Ambulatory Visit: Payer: BC Managed Care – PPO

## 2023-09-27 LAB — CULTURE, BETA STREP (GROUP B ONLY)

## 2023-09-27 NOTE — Progress Notes (Signed)
 Contacted pt per Dr Onesimo to let patient know she is quite iron  deficient and as discussed she will need to be scheduled for  IV venofer  200mg  weekly x 4 @ [facility].  Pt scheduled and notified of date and time for first infusion. Pt acknowledged information and verbalized understanding.

## 2023-10-01 ENCOUNTER — Encounter (HOSPITAL_COMMUNITY)
Admission: RE | Admit: 2023-10-01 | Discharge: 2023-10-01 | Disposition: A | Discharge status: Home / Self Care | Payer: BC Managed Care – PPO | Source: Ambulatory Visit | Attending: Hematology | Admitting: Hematology

## 2023-10-01 DIAGNOSIS — D509 Iron deficiency anemia, unspecified: Secondary | ICD-10-CM | POA: Diagnosis present

## 2023-10-01 DIAGNOSIS — O99012 Anemia complicating pregnancy, second trimester: Secondary | ICD-10-CM | POA: Diagnosis present

## 2023-10-01 MED ORDER — IRON SUCROSE 200 MG IVPB - SIMPLE MED
200.0000 mg | Status: DC
  Administered 2023-10-01: 200 mg via INTRAVENOUS
  Filled 2023-10-01: qty 200

## 2023-10-08 ENCOUNTER — Encounter (HOSPITAL_COMMUNITY)
Admission: RE | Admit: 2023-10-08 | Discharge: 2023-10-08 | Disposition: A | Discharge status: Home / Self Care | Payer: BC Managed Care – PPO | Source: Ambulatory Visit | Attending: Hematology

## 2023-10-08 DIAGNOSIS — D509 Iron deficiency anemia, unspecified: Secondary | ICD-10-CM

## 2023-10-08 DIAGNOSIS — O99012 Anemia complicating pregnancy, second trimester: Secondary | ICD-10-CM

## 2023-10-08 MED ORDER — IRON SUCROSE 200 MG IVPB - SIMPLE MED
200.0000 mg | Status: DC
  Administered 2023-10-08: 200 mg via INTRAVENOUS
  Filled 2023-10-08: qty 200

## 2023-10-11 ENCOUNTER — Other Ambulatory Visit (HOSPITAL_COMMUNITY): Payer: Self-pay | Admitting: *Deleted

## 2023-10-15 ENCOUNTER — Ambulatory Visit (HOSPITAL_COMMUNITY)
Admission: RE | Admit: 2023-10-15 | Discharge: 2023-10-15 | Disposition: A | Discharge status: Home / Self Care | Payer: BC Managed Care – PPO | Source: Ambulatory Visit | Attending: Hematology | Admitting: Hematology

## 2023-10-15 DIAGNOSIS — D509 Iron deficiency anemia, unspecified: Secondary | ICD-10-CM | POA: Diagnosis present

## 2023-10-15 DIAGNOSIS — O99012 Anemia complicating pregnancy, second trimester: Secondary | ICD-10-CM | POA: Insufficient documentation

## 2023-10-15 MED ORDER — IRON SUCROSE 200 MG IVPB - SIMPLE MED
200.0000 mg | Status: DC
  Administered 2023-10-15: 200 mg via INTRAVENOUS
  Filled 2023-10-15: qty 200

## 2023-10-19 ENCOUNTER — Inpatient Hospital Stay (HOSPITAL_COMMUNITY)
Admission: AD | Admit: 2023-10-19 | Discharge: 2023-10-23 | DRG: 786 | Disposition: A | Discharge status: Home / Self Care | Payer: BC Managed Care – PPO | Attending: Obstetrics and Gynecology | Admitting: Obstetrics and Gynecology

## 2023-10-19 ENCOUNTER — Encounter (HOSPITAL_COMMUNITY): Payer: Self-pay | Admitting: Obstetrics and Gynecology

## 2023-10-19 ENCOUNTER — Inpatient Hospital Stay (HOSPITAL_COMMUNITY): Payer: BC Managed Care – PPO | Admitting: Certified Registered Nurse Anesthetist

## 2023-10-19 ENCOUNTER — Encounter: Payer: Self-pay | Admitting: Hematology

## 2023-10-19 ENCOUNTER — Encounter (HOSPITAL_COMMUNITY)
Admission: AD | Disposition: A | Discharge status: Home / Self Care | Payer: Self-pay | Source: Home / Self Care | Attending: Obstetrics and Gynecology

## 2023-10-19 ENCOUNTER — Other Ambulatory Visit: Payer: Self-pay

## 2023-10-19 DIAGNOSIS — K219 Gastro-esophageal reflux disease without esophagitis: Secondary | ICD-10-CM | POA: Diagnosis present

## 2023-10-19 DIAGNOSIS — Z3490 Encounter for supervision of normal pregnancy, unspecified, unspecified trimester: Secondary | ICD-10-CM

## 2023-10-19 DIAGNOSIS — F411 Generalized anxiety disorder: Secondary | ICD-10-CM | POA: Diagnosis present

## 2023-10-19 DIAGNOSIS — O368130 Decreased fetal movements, third trimester, not applicable or unspecified: Principal | ICD-10-CM | POA: Diagnosis present

## 2023-10-19 DIAGNOSIS — O09292 Supervision of pregnancy with other poor reproductive or obstetric history, second trimester: Secondary | ICD-10-CM | POA: Diagnosis not present

## 2023-10-19 DIAGNOSIS — Z3A31 31 weeks gestation of pregnancy: Secondary | ICD-10-CM

## 2023-10-19 DIAGNOSIS — D62 Acute posthemorrhagic anemia: Secondary | ICD-10-CM | POA: Diagnosis not present

## 2023-10-19 DIAGNOSIS — O99214 Obesity complicating childbirth: Secondary | ICD-10-CM | POA: Diagnosis present

## 2023-10-19 DIAGNOSIS — O4593 Premature separation of placenta, unspecified, third trimester: Principal | ICD-10-CM | POA: Diagnosis present

## 2023-10-19 DIAGNOSIS — K5900 Constipation, unspecified: Secondary | ICD-10-CM | POA: Diagnosis not present

## 2023-10-19 DIAGNOSIS — O09523 Supervision of elderly multigravida, third trimester: Secondary | ICD-10-CM | POA: Diagnosis present

## 2023-10-19 DIAGNOSIS — O34211 Maternal care for low transverse scar from previous cesarean delivery: Secondary | ICD-10-CM | POA: Diagnosis present

## 2023-10-19 DIAGNOSIS — O9962 Diseases of the digestive system complicating childbirth: Secondary | ICD-10-CM | POA: Diagnosis present

## 2023-10-19 DIAGNOSIS — Z96642 Presence of left artificial hip joint: Secondary | ICD-10-CM | POA: Diagnosis present

## 2023-10-19 DIAGNOSIS — O9081 Anemia of the puerperium: Secondary | ICD-10-CM | POA: Diagnosis not present

## 2023-10-19 DIAGNOSIS — Z8249 Family history of ischemic heart disease and other diseases of the circulatory system: Secondary | ICD-10-CM

## 2023-10-19 DIAGNOSIS — Z79899 Other long term (current) drug therapy: Secondary | ICD-10-CM | POA: Diagnosis not present

## 2023-10-19 DIAGNOSIS — O368131 Decreased fetal movements, third trimester, fetus 1: Secondary | ICD-10-CM | POA: Diagnosis present

## 2023-10-19 DIAGNOSIS — O99844 Bariatric surgery status complicating childbirth: Secondary | ICD-10-CM | POA: Diagnosis present

## 2023-10-19 DIAGNOSIS — O99344 Other mental disorders complicating childbirth: Secondary | ICD-10-CM | POA: Diagnosis present

## 2023-10-19 DIAGNOSIS — Z98891 History of uterine scar from previous surgery: Secondary | ICD-10-CM

## 2023-10-19 LAB — CBC
HCT: 31.8 % — ABNORMAL LOW (ref 36.0–46.0)
Hemoglobin: 9.7 g/dL — ABNORMAL LOW (ref 12.0–15.0)
MCH: 26.1 pg (ref 26.0–34.0)
MCHC: 30.5 g/dL (ref 30.0–36.0)
MCV: 85.7 fL (ref 80.0–100.0)
Platelets: 167 10*3/uL (ref 150–400)
RBC: 3.71 MIL/uL — ABNORMAL LOW (ref 3.87–5.11)
RDW: 19.5 % — ABNORMAL HIGH (ref 11.5–15.5)
WBC: 7.4 10*3/uL (ref 4.0–10.5)
nRBC: 0 % (ref 0.0–0.2)

## 2023-10-19 LAB — TYPE AND SCREEN
ABO/RH(D): O POS
Antibody Screen: NEGATIVE

## 2023-10-19 LAB — HIV ANTIBODY (ROUTINE TESTING W REFLEX): HIV Screen 4th Generation wRfx: NONREACTIVE

## 2023-10-19 SURGERY — Surgical Case
Anesthesia: Spinal | Site: Abdomen

## 2023-10-19 MED ORDER — MORPHINE SULFATE (PF) 0.5 MG/ML IJ SOLN
INTRAMUSCULAR | Status: AC
Start: 2023-10-19 — End: ?
  Filled 2023-10-19: qty 10

## 2023-10-19 MED ORDER — NALOXONE HCL 4 MG/10ML IJ SOLN: 1.0000 ug/kg/h | INTRAVENOUS | Status: DC | PRN

## 2023-10-19 MED ORDER — SIMETHICONE 80 MG PO CHEW
80.0000 mg | CHEWABLE_TABLET | Freq: Three times a day (TID) | ORAL | Status: DC
  Administered 2023-10-20 – 2023-10-23 (×8): 80 mg via ORAL
  Filled 2023-10-19 (×9): qty 1

## 2023-10-19 MED ORDER — MENTHOL 3 MG MT LOZG: 1.0000 | LOZENGE | OROMUCOSAL | Status: DC | PRN

## 2023-10-19 MED ORDER — PANTOPRAZOLE SODIUM 40 MG PO TBEC
40.0000 mg | DELAYED_RELEASE_TABLET | Freq: Every day | ORAL | Status: DC
  Administered 2023-10-20 – 2023-10-23 (×4): 40 mg via ORAL
  Filled 2023-10-19 (×4): qty 1

## 2023-10-19 MED ORDER — ZOLPIDEM TARTRATE 5 MG PO TABS: 5.0000 mg | ORAL_TABLET | Freq: Every evening | ORAL | Status: DC | PRN

## 2023-10-19 MED ORDER — KETOROLAC TROMETHAMINE 30 MG/ML IJ SOLN
30.0000 mg | Freq: Four times a day (QID) | INTRAMUSCULAR | Status: AC
  Administered 2023-10-20 (×3): 30 mg via INTRAVENOUS
  Filled 2023-10-19 (×3): qty 1

## 2023-10-19 MED ORDER — WITCH HAZEL-GLYCERIN EX PADS: 1.0000 | MEDICATED_PAD | CUTANEOUS | Status: DC | PRN

## 2023-10-19 MED ORDER — KETOROLAC TROMETHAMINE 30 MG/ML IJ SOLN
30.0000 mg | Freq: Four times a day (QID) | INTRAMUSCULAR | Status: DC | PRN
  Administered 2023-10-19: 30 mg via INTRAVENOUS
  Filled 2023-10-19: qty 1

## 2023-10-19 MED ORDER — ONDANSETRON HCL 4 MG/2ML IJ SOLN
INTRAMUSCULAR | Status: DC | PRN
  Administered 2023-10-19: 4 mg via INTRAVENOUS

## 2023-10-19 MED ORDER — SENNOSIDES-DOCUSATE SODIUM 8.6-50 MG PO TABS
2.0000 | ORAL_TABLET | Freq: Every day | ORAL | Status: DC
  Administered 2023-10-20 – 2023-10-22 (×3): 2 via ORAL
  Filled 2023-10-19 (×3): qty 2

## 2023-10-19 MED ORDER — ACETAMINOPHEN 325 MG PO TABS: 650.0000 mg | ORAL_TABLET | Freq: Four times a day (QID) | ORAL | Status: DC

## 2023-10-19 MED ORDER — FENTANYL CITRATE (PF) 100 MCG/2ML IJ SOLN
INTRAMUSCULAR | Status: AC
  Filled 2023-10-19: qty 2

## 2023-10-19 MED ORDER — BETAMETHASONE SOD PHOS & ACET 6 (3-3) MG/ML IJ SUSP: 12.0000 mg | Freq: Once | INTRAMUSCULAR | Status: DC

## 2023-10-19 MED ORDER — LACTATED RINGERS IV SOLN: INTRAVENOUS | Status: DC | PRN

## 2023-10-19 MED ORDER — TRANEXAMIC ACID-NACL 1000-0.7 MG/100ML-% IV SOLN
INTRAVENOUS | Status: DC | PRN
  Administered 2023-10-19: 1000 mg via INTRAVENOUS

## 2023-10-19 MED ORDER — COCONUT OIL OIL: 1.0000 | TOPICAL_OIL | Status: DC | PRN

## 2023-10-19 MED ORDER — PRENATAL MULTIVITAMIN CH
1.0000 | ORAL_TABLET | Freq: Every day | ORAL | Status: DC
  Administered 2023-10-20 – 2023-10-22 (×3): 1 via ORAL
  Filled 2023-10-19 (×3): qty 1

## 2023-10-19 MED ORDER — FENTANYL CITRATE (PF) 100 MCG/2ML IJ SOLN
INTRAMUSCULAR | Status: DC | PRN
  Administered 2023-10-19: 15 ug via INTRATHECAL

## 2023-10-19 MED ORDER — ALBUTEROL SULFATE (2.5 MG/3ML) 0.083% IN NEBU: 2.5000 mg | INHALATION_SOLUTION | RESPIRATORY_TRACT | Status: DC | PRN

## 2023-10-19 MED ORDER — CEFAZOLIN SODIUM-DEXTROSE 2-3 GM-%(50ML) IV SOLR
INTRAVENOUS | Status: DC | PRN
  Administered 2023-10-19: 2 g via INTRAVENOUS

## 2023-10-19 MED ORDER — OXYTOCIN-SODIUM CHLORIDE 30-0.9 UT/500ML-% IV SOLN: 2.5000 [IU]/h | INTRAVENOUS | Status: AC

## 2023-10-19 MED ORDER — MEPERIDINE HCL 25 MG/ML IJ SOLN
6.2500 mg | INTRAMUSCULAR | Status: DC | PRN
Start: 2023-10-19 — End: 2023-10-19

## 2023-10-19 MED ORDER — IBUPROFEN 600 MG PO TABS: 600.0000 mg | ORAL_TABLET | Freq: Four times a day (QID) | ORAL | Status: DC | PRN

## 2023-10-19 MED ORDER — BUPIVACAINE IN DEXTROSE 0.75-8.25 % IT SOLN
INTRATHECAL | Status: DC | PRN
  Administered 2023-10-19: 1.8 mL via INTRATHECAL

## 2023-10-19 MED ORDER — KETOROLAC TROMETHAMINE 30 MG/ML IJ SOLN: 30.0000 mg | Freq: Four times a day (QID) | INTRAMUSCULAR | Status: DC | PRN

## 2023-10-19 MED ORDER — DIPHENHYDRAMINE HCL 25 MG PO CAPS: 25.0000 mg | ORAL_CAPSULE | Freq: Four times a day (QID) | ORAL | Status: DC | PRN

## 2023-10-19 MED ORDER — DIPHENHYDRAMINE HCL 25 MG PO CAPS
25.0000 mg | ORAL_CAPSULE | ORAL | Status: DC | PRN
  Administered 2023-10-21: 25 mg via ORAL
  Filled 2023-10-19: qty 1

## 2023-10-19 MED ORDER — OXYTOCIN-SODIUM CHLORIDE 30-0.9 UT/500ML-% IV SOLN
INTRAVENOUS | Status: DC | PRN
  Administered 2023-10-19: 300 mL via INTRAVENOUS

## 2023-10-19 MED ORDER — STERILE WATER FOR IRRIGATION IR SOLN
Status: DC | PRN
  Administered 2023-10-19: 1000 mL

## 2023-10-19 MED ORDER — SODIUM CHLORIDE 0.9% FLUSH
3.0000 mL | INTRAVENOUS | Status: DC | PRN
Start: 2023-10-19 — End: 2023-10-23

## 2023-10-19 MED ORDER — FENTANYL CITRATE (PF) 100 MCG/2ML IJ SOLN: 25.0000 ug | INTRAMUSCULAR | Status: DC | PRN

## 2023-10-19 MED ORDER — ACETAMINOPHEN 10 MG/ML IV SOLN
INTRAVENOUS | Status: DC | PRN
  Administered 2023-10-19: 1000 mg via INTRAVENOUS

## 2023-10-19 MED ORDER — DEXAMETHASONE SODIUM PHOSPHATE 10 MG/ML IJ SOLN
INTRAMUSCULAR | Status: AC
  Filled 2023-10-19: qty 1

## 2023-10-19 MED ORDER — KETOROLAC TROMETHAMINE 30 MG/ML IJ SOLN: 30.0000 mg | Freq: Four times a day (QID) | INTRAMUSCULAR | Status: AC

## 2023-10-19 MED ORDER — OXYCODONE HCL 5 MG PO TABS
5.0000 mg | ORAL_TABLET | ORAL | Status: DC | PRN
  Administered 2023-10-20 – 2023-10-23 (×10): 10 mg via ORAL
  Filled 2023-10-19 (×10): qty 2

## 2023-10-19 MED ORDER — SIMETHICONE 80 MG PO CHEW
80.0000 mg | CHEWABLE_TABLET | ORAL | Status: DC | PRN
  Administered 2023-10-23: 80 mg via ORAL
  Filled 2023-10-19: qty 1

## 2023-10-19 MED ORDER — MORPHINE SULFATE (PF) 0.5 MG/ML IJ SOLN
INTRAMUSCULAR | Status: DC | PRN
Start: 2023-10-19 — End: 2023-10-19
  Administered 2023-10-19: .15 mg via INTRATHECAL

## 2023-10-19 MED ORDER — DEXAMETHASONE SODIUM PHOSPHATE 10 MG/ML IJ SOLN
INTRAMUSCULAR | Status: DC | PRN
  Administered 2023-10-19: 10 mg via INTRAVENOUS

## 2023-10-19 MED ORDER — ONDANSETRON HCL 4 MG/2ML IJ SOLN: 4.0000 mg | Freq: Three times a day (TID) | INTRAMUSCULAR | Status: DC | PRN

## 2023-10-19 MED ORDER — SODIUM CHLORIDE 0.9 % IR SOLN
Status: DC | PRN
  Administered 2023-10-19: 1

## 2023-10-19 MED ORDER — ACETAMINOPHEN 325 MG PO TABS
650.0000 mg | ORAL_TABLET | ORAL | Status: DC | PRN
  Filled 2023-10-19: qty 2

## 2023-10-19 MED ORDER — DIPHENHYDRAMINE HCL 50 MG/ML IJ SOLN
12.5000 mg | INTRAMUSCULAR | Status: DC | PRN
  Administered 2023-10-19 – 2023-10-20 (×2): 12.5 mg via INTRAVENOUS
  Filled 2023-10-19 (×2): qty 1

## 2023-10-19 MED ORDER — SERTRALINE HCL 50 MG PO TABS
150.0000 mg | ORAL_TABLET | Freq: Every day | ORAL | Status: DC
  Administered 2023-10-20 – 2023-10-23 (×4): 150 mg via ORAL
  Filled 2023-10-19 (×4): qty 3

## 2023-10-19 MED ORDER — LACTATED RINGERS IV BOLUS: 1000.0000 mL | Freq: Once | INTRAVENOUS | Status: DC

## 2023-10-19 MED ORDER — NALOXONE HCL 0.4 MG/ML IJ SOLN: 0.4000 mg | INTRAMUSCULAR | Status: DC | PRN

## 2023-10-19 MED ORDER — DIBUCAINE (PERIANAL) 1 % EX OINT: 1.0000 | TOPICAL_OINTMENT | CUTANEOUS | Status: DC | PRN

## 2023-10-19 MED ORDER — IBUPROFEN 600 MG PO TABS
600.0000 mg | ORAL_TABLET | Freq: Four times a day (QID) | ORAL | Status: DC
  Administered 2023-10-20 – 2023-10-23 (×10): 600 mg via ORAL
  Filled 2023-10-19 (×12): qty 1

## 2023-10-19 MED ORDER — PHENYLEPHRINE HCL-NACL 20-0.9 MG/250ML-% IV SOLN
INTRAVENOUS | Status: DC | PRN
  Administered 2023-10-19: 60 ug/min via INTRAVENOUS

## 2023-10-19 MED ORDER — PROPOFOL 10 MG/ML IV BOLUS
INTRAVENOUS | Status: AC
  Filled 2023-10-19: qty 20

## 2023-10-19 SURGICAL SUPPLY — 32 items
BENZOIN TINCTURE PRP APPL 2/3 (GAUZE/BANDAGES/DRESSINGS) IMPLANT
CHLORAPREP W/TINT 26 (MISCELLANEOUS) ×2 IMPLANT
CLAMP UMBILICAL CORD (MISCELLANEOUS) ×1 IMPLANT
CLOTH BEACON ORANGE TIMEOUT ST (SAFETY) ×1 IMPLANT
DRSG OPSITE POSTOP 4X10 (GAUZE/BANDAGES/DRESSINGS) ×1 IMPLANT
ELECT REM PT RETURN 9FT ADLT (ELECTROSURGICAL) ×1 IMPLANT
ELECTRODE REM PT RTRN 9FT ADLT (ELECTROSURGICAL) ×1 IMPLANT
EXTRACTOR VACUUM M CUP 4 TUBE (SUCTIONS) IMPLANT
GLOVE BIOGEL PI IND STRL 7.0 (GLOVE) ×3 IMPLANT
GLOVE ECLIPSE 7.0 STRL STRAW (GLOVE) ×1 IMPLANT
GOWN STRL REUS W/TWL LRG LVL3 (GOWN DISPOSABLE) ×1 IMPLANT
GOWN STRL REUS W/TWL XL LVL3 (GOWN DISPOSABLE) ×1 IMPLANT
KIT ABG SYR 3ML LUER SLIP (SYRINGE) IMPLANT
MAT PREVALON FULL STRYKER (MISCELLANEOUS) IMPLANT
NDL HYPO 25X5/8 SAFETYGLIDE (NEEDLE) ×1 IMPLANT
NEEDLE HYPO 22GX1.5 SAFETY (NEEDLE) ×1 IMPLANT
NEEDLE HYPO 25X5/8 SAFETYGLIDE (NEEDLE) ×1 IMPLANT
NS IRRIG 1000ML POUR BTL (IV SOLUTION) ×1 IMPLANT
PACK C SECTION WH (CUSTOM PROCEDURE TRAY) ×1 IMPLANT
PAD ABD 7.5X8 STRL (GAUZE/BANDAGES/DRESSINGS) ×1 IMPLANT
PAD OB MATERNITY 4.3X12.25 (PERSONAL CARE ITEMS) ×1 IMPLANT
RTRCTR C-SECT PINK 25CM LRG (MISCELLANEOUS) IMPLANT
STRIP CLOSURE SKIN 1/2X4 (GAUZE/BANDAGES/DRESSINGS) IMPLANT
SUT PDS AB 0 CTX 36 PDP370T (SUTURE) IMPLANT
SUT PLAIN 2 0 XLH (SUTURE) IMPLANT
SUT VIC AB 0 CTX36XBRD ANBCTRL (SUTURE) ×2 IMPLANT
SUT VIC AB 2-0 CT1 TAPERPNT 27 (SUTURE) IMPLANT
SUT VIC AB 4-0 KS 27 (SUTURE) ×1 IMPLANT
SYR CONTROL 10ML LL (SYRINGE) ×1 IMPLANT
TOWEL OR 17X24 6PK STRL BLUE (TOWEL DISPOSABLE) ×1 IMPLANT
TRAY FOLEY W/BAG SLVR 14FR LF (SET/KITS/TRAYS/PACK) ×1 IMPLANT
WATER STERILE IRR 1000ML POUR (IV SOLUTION) ×1 IMPLANT

## 2023-10-19 NOTE — Progress Notes (Signed)
 Pt came in for decreased fetal movement and had a STAT CS by Dr Macon Large and Dr Earlene Plater.  I came in the OR on fascia.  Will erite PP orders

## 2023-10-19 NOTE — Progress Notes (Addendum)
 Dr. Macon Large into see pt.  Pt informed FHR tracing indicates fetal distress and she recommends emergency delivery via Cesarean.  Pt verbalized understanding and agreeable to Cesarean Section delivery.

## 2023-10-19 NOTE — Anesthesia Postprocedure Evaluation (Signed)
 Anesthesia Post Note  Patient: Malania T Trevathan  Procedure(s) Performed: CESAREAN SECTION (Abdomen)     Patient location during evaluation: PACU Anesthesia Type: Spinal Level of consciousness: oriented and awake and alert Pain management: pain level controlled Vital Signs Assessment: post-procedure vital signs reviewed and stable Respiratory status: spontaneous breathing, respiratory function stable and nonlabored ventilation Cardiovascular status: blood pressure returned to baseline and stable Postop Assessment: no headache, no backache, no apparent nausea or vomiting and spinal receding Anesthetic complications: no   No notable events documented.  Last Vitals:  Vitals:   10/19/23 1630 10/19/23 1645  BP: 98/65 113/70  Pulse: 77 79  Resp: 16 15  Temp:    SpO2: 98% 99%    Last Pain:  Vitals:   10/19/23 1645  TempSrc:   PainSc: 0-No pain   Pain Goal:    LLE Motor Response: No movement due to regional block (10/19/23 1645) LLE Sensation: Numbness (10/19/23 1645) RLE Motor Response: No movement due to regional block (10/19/23 1645) RLE Sensation: Numbness (10/19/23 1645)     Epidural/Spinal Function Cutaneous sensation: No Sensation (10/19/23 1645), Patient able to flex knees: No (10/19/23 1645), Patient able to lift hips off bed: No (10/19/23 1645), Back pain beyond tenderness at insertion site: No (10/19/23 1645), Progressively worsening motor and/or sensory loss: No (10/19/23 1645), Bowel and/or bladder incontinence post epidural: No (10/19/23 1645)  Tiquan Bouch A.

## 2023-10-19 NOTE — Progress Notes (Addendum)
 Pt to OR via stretcher for Stat Cesarean.

## 2023-10-19 NOTE — Anesthesia Procedure Notes (Signed)
 Spinal  Patient location during procedure: OR Start time: 10/19/2023 3:02 PM End time: 10/19/2023 3:06 PM Reason for block: surgical anesthesia Staffing Performed: anesthesiologist  Anesthesiologist: Mal Amabile, MD Performed by: Mal Amabile, MD Authorized by: Mal Amabile, MD   Preanesthetic Checklist Completed: patient identified, IV checked, site marked, risks and benefits discussed, surgical consent, monitors and equipment checked, pre-op evaluation and timeout performed Spinal Block Patient position: sitting Prep: DuraPrep and site prepped and draped Patient monitoring: heart rate, cardiac monitor, continuous pulse ox and blood pressure Approach: midline Location: L3-4 Injection technique: single-shot Needle Needle type: Pencan  Needle gauge: 24 G Needle length: 9 cm Needle insertion depth: 7 cm Assessment Sensory level: T4 Events: CSF return Additional Notes Patient tolerated procedure well. Adequate sensory level.

## 2023-10-19 NOTE — Op Note (Addendum)
 Cesarean Section Operative Note   Patient: Sarah Saunders  Date of Procedure: 10/19/2023  Procedure: Primary Low Transverse Cesarean   Indications: non-reassuring fetal status, Category 3 tracing  Pre-operative Diagnosis: STAT  Post-operative Diagnosis: Same and Placental Abruption  TOLAC Candidate: Yes   Surgeon: Surgeons and Role:    * Tereso Newcomer, MD - Primary    * Joanne Gavel, MD - Fellow    * Jaymes Graff, MD  Assistants: An experienced assistant was required given the standard of surgical care given the complexity of the case.  This assistant was needed for exposure, dissection, suctioning, retraction, instrument exchange, assisting with delivery with administration of fundal pressure, and for overall help during the procedure.   Anesthesia: spinal  Anesthesiologist: Mal Amabile, MD   Antibiotics: Cefazolin   Estimated Blood Loss: 426 ml   Total IV Fluids: 1150 ml  Urine Output:  25 cc OF clear urine  Specimens: Placenta to pathology  Complications:  Placental abruption    Indications: Sarah Saunders is a 48 y.o. [id number] with an IUP [redacted]w[redacted]d presenting for unscheduled, emergent cesarean secondary to the indications listed above. Patient presented to MAU for DFM. Was found to have a Category 3 tracing, and code cesarean called by Dr. Macon Large from MAU.  The risks of cesarean section emergently discussed with the patient included but were not limited to: bleeding which may require transfusion or reoperation; infection which may require antibiotics; injury to bowel, bladder, ureters or other surrounding organs; injury to the fetus; need for additional procedures including hysterectomy in the event of a life-threatening hemorrhage; placental abnormalities with subsequent pregnancies, incisional problems, thromboembolic phenomenon and other postoperative/anesthesia complications. The patient concurred with the proposed plan, giving informed verbal  consent for the procedure given emergent nature of the case. Patient NPO status waived given urgency of case. Anesthesia and OR aware. Preoperative prophylactic antibiotics and SCDs ordered on call to the OR.   Findings: Viable infant in cephalic presentation, no nuchal cord present. Apgars pending. Weight pending. Bloody amniotic fluid.  Abrupted  placenta, three vessel cord. Normal uterus, Normal left and Surgically absent right fallopian tubes, Normal bilateral ovaries. No adhesive disease was encountered.  Procedure Details: The patient received intravenous antibiotics during surgery given emergent nature of case. The patient was taken back to the operative suite where spinal anesthesia was administered. After induction of anesthesia, the patient was draped and prepped with a splash betadine prep. A low transverse skin incision was made with scalpel and carried down through the subcutaneous tissue to the fascia. Fascial incision was made and extended transversely bluntly. The fascia was separated from the underlying rectus tissue superiorly and inferiorly bluntly. The rectus muscles were separated in the midline bluntly and the peritoneum was entered bluntly. A Rich retractor and bladder blade were placed to aid in visualization of the uterus. A bladder flap was not developed. A low transverse uterine incision was made. The infant was successfully delivered from cephalic presentation, the umbilical cord was clamped immediately given to suspected abruption. Cord ph was sent, and cord blood was obtained for evaluation. The placenta was removed Intact and appeared abnormal - with abruption . Alexis placed for better visualization. The uterine incision was closed with a single layer running locked suture of 0-Monocryl. Due to ongoing bleeding a second layer of 0 Monocryl was placed in an imbricating fashion, after which there was excellent hemostasis. The abdomen and the pelvis were cleared of all clot and  debris and the  Jon Gills was removed. Hemostasis was confirmed on all surfaces.  The peritoneum was reapproximated using 2-0 vicryl . The fascia was then closed using 0 Vicryl in a running fashion. Dr. Normand Sloop arrived while fascia being closed. The subcutaneous layer was reapproximated with 2-0 plain gut suture. The skin was closed with a 4-0 vicryl subcuticular stitch. The patient tolerated the procedure well. Sponge, lap, instrument and needle counts were correct x 2. She was taken to the recovery room in stable condition.  Disposition: PACU - hemodynamically stable.    Signed: Joanne Gavel, MD OB Fellow, [facility] for [facility], [facility]

## 2023-10-19 NOTE — Transfer of Care (Signed)
 Immediate Anesthesia Transfer of Care Note  Patient: Sarah Saunders  Procedure(s) Performed: CESAREAN SECTION (Abdomen)  Patient Location: PACU  Anesthesia Type:Spinal  Level of Consciousness: awake, alert , and oriented  Airway & Oxygen Therapy: Patient Spontanous Breathing  Post-op Assessment: Report given to RN and Post -op Vital signs reviewed and stable  Post vital signs: Reviewed and stable  Last Vitals:  Vitals Value Taken Time  BP 103/66 10/19/23 1601  Temp    Pulse 77 10/19/23 1608  Resp 14 10/19/23 1608  SpO2 99 % 10/19/23 1608  Vitals shown include unfiled device data.  Last Pain:  Vitals:   10/19/23 1603  TempSrc:   PainSc: 0-No pain         Complications: No notable events documented.

## 2023-10-19 NOTE — Progress Notes (Signed)
    Faculty Practice OB/GYN Attending Note (late entry)  Called to evaluate patient with concerning FHR tracing in the setting of decreased fetal movement. On review, FHR noted have a baseline of 150 bpm and absent variability for about 10 minutes. Patient was told this was indicative of fetal distress, emergent cesarean delivery recommended and she agreed. Code Cesarean activated.    Jaynie Collins, MD, FACOG Obstetrician & Gynecologist, [facility] for [employer], [facility]

## 2023-10-19 NOTE — Anesthesia Preprocedure Evaluation (Signed)
 Anesthesia Evaluation  Patient identified by MRN, date of birth, ID band Patient awake    Reviewed: Allergy & Precautions, NPO status , Unable to perform ROS - Chart review only  Airway Mallampati: II       Dental  (+) Teeth Intact   Pulmonary neg pulmonary ROS   Pulmonary exam normal        Cardiovascular negative cardio ROS Normal cardiovascular exam Rhythm:Regular Rate:Normal     Neuro/Psych    GI/Hepatic Neg liver ROS,GERD  ,,  Endo/Other  Obesity  Renal/GU Renal disease     Musculoskeletal   Abdominal  (+) + obese  Peds  Hematology  (+) Blood dyscrasia, anemia   Anesthesia Other Findings   Reproductive/Obstetrics (+) Pregnancy 31 weeks Placental abruption AMA                              Anesthesia Physical Anesthesia Plan  ASA: 2 and emergent  Anesthesia Plan: Spinal   Post-op Pain Management: Minimal or no pain anticipated   Induction: Intravenous  PONV Risk Score and Plan: 4 or greater and Treatment may vary due to age or medical condition  Airway Management Planned: Natural Airway  Additional Equipment: None  Intra-op Plan:   Post-operative Plan:   Informed Consent: I have reviewed the patients History and Physical, chart, labs and discussed the procedure including the risks, benefits and alternatives for the proposed anesthesia with the patient or authorized representative who has indicated his/her understanding and acceptance.       Plan Discussed with: Anesthesiologist and CRNA  Anesthesia Plan Comments:          Anesthesia Quick Evaluation

## 2023-10-19 NOTE — H&P (Signed)
 OB ADMISSION/ HISTORY & PHYSICAL:  Admission Date: 10/19/2023  2:23 PM  Admit Diagnosis: Placental abruption  Sarah Saunders is a 48 y.o. female [id number] [redacted]w[redacted]d presenting for decreased fetal movement. Perceived less fetal movement on 10/18/23 and movement decreased even more on today. Denies LOF and vaginal bleeding. Absent variability on EFM and pt moved to OR for emergent cesarean section. IVF conception with donor egg. Hx of IUFD at 26 wks in 2023.    Pregnancy complicated by AMA and shortened cervix, managed on vaginal progesterone. Hx of left hip replacement, bariatric surgery (duodenal switch in 2018), SVT, and anemia.    History of current pregnancy: [id number]   Patient entered care with [facility] at 12+4 wks.   EDC 12/21/23 by embryo transfer Anatomy scan:  complete w/ posterior placenta.   Admitted at 27 wks for threatened preterm labor, rec'vd antenatal steroids  Significant prenatal events:  Patient Active Problem List   Diagnosis Date Noted   Pregnancy 10/19/2023   S/P C-section 10/19/2023   AMA (advanced maternal age) multigravida 35+, third trimester 10/19/2023   Decreased fetal movement affecting management of pregnancy in third trimester, fetus 1 10/19/2023   Short cervix affecting pregnancy 09/25/2023   Kidney stone complicating pregnancy 07/05/2023   IUFD at 20 weeks or more of gestation 06/19/2022   PSVT (paroxysmal supraventricular tachycardia) (HCC) 05/26/2019   Iron deficiency anemia 12/13/2016   Morbid obesity (HCC) 10/09/2016   Tachycardia 01/08/2012   Benign neoplasm of skin 03/06/2008   POLYCYSTIC OVARIAN DISEASE 02/27/2008   Overweight 02/27/2008    Prenatal Labs: ABO, Rh: --/--/O POS (02/28 1455) Antibody: NEG (02/28 1455) Rubella: Immune (11/21 0000)  RPR: Nonreactive (11/21 0000)  HBsAg: Negative (11/21 0000)  HIV: Non Reactive (02/28 1453)  GTT: normal  GBS:   unknown GC/CHL: neg/neg Genetics: declined Vaccines: Tdap: declined Influenza:  received   OB History  Gravida Para Term Preterm AB Living  5 3 1 2 2 2   SAB IAB Ectopic Multiple Live Births  0 1 1 0 1    # Outcome Date GA Lbr Len/2nd Weight Sex Type Anes PTL Lv  5 Preterm 10/19/23 [redacted]w[redacted]d   M CS-LTranv Spinal  LIV  4 Preterm 06/20/22 [redacted]w[redacted]d  1151 g M Vag-Breech Other  FD     Birth Comments: extra digit left hand no bone involvement  3 IAB      Biochemical     2 Ectopic           1 Term      Vag-Spont       Medical / Surgical History: Past medical history:  Past Medical History:  Diagnosis Date   Anxiety    Morbid obesity (HCC) 10/09/2016   Pituitary tumor    Polycystic ovary disease    Prolactinoma (HCC)     Past surgical history:  Past Surgical History:  Procedure Laterality Date   CYSTOSCOPY/URETEROSCOPY/HOLMIUM LASER/STENT PLACEMENT Right 07/06/2023   Procedure: CYSTOSCOPY/RIGHT RETROGRADE URETEROSCOPY/HOLMIUM LASER/STENT PLACEMENT;  Surgeon: Jannifer Hick, MD;  Location: [facility];  Service: Urology;  Laterality: Right;   JOINT REPLACEMENT Left 01/13/2021   hip   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)     TUBAL LIGATION     right fallopian tube removed   tube removed     Family History:  Family History  Problem Relation Age of Onset   Hypertension Father    Heart failure Father    Hypertension Mother    Heart failure Brother  Social History:  reports that she has never smoked. She has never used smokeless tobacco. She reports that she does not drink alcohol and does not use drugs.  Allergies: Butorphanol   Current Medications at time of admission:  Prior to Admission medications   Medication Sig Start Date End Date Taking? Authorizing Provider  acetaminophen (TYLENOL) 325 MG tablet Take 2 tablets (650 mg total) by mouth every 4 (four) hours as needed for mild pain or moderate pain (for pain scale < 4  OR  temperature  >/=  100.5 F). 06/21/22   Osborn Coho, MD  albuterol (PROVENTIL HFA;VENTOLIN HFA) 108  (90 BASE) MCG/ACT inhaler Inhale 2 puffs into the lungs every 4 (four) hours as needed for wheezing or shortness of breath.    [provider]  Cetirizine HCl (ZYRTEC ALLERGY) 10 MG CAPS Take 10 mg by mouth daily.    [provider]  docusate sodium (COLACE) 100 MG capsule Take 1 capsule (100 mg total) by mouth daily. 09/27/23   Jaymes Graff, MD  Fe Fum-Fe Poly-Vit C-Lactobac (FUSION PO) Take 1 capsule by mouth daily.    [provider]  ferrous sulfate 325 (65 FE) MG EC tablet Take 1 tablet (325 mg total) by mouth every other day. 06/21/22   Osborn Coho, MD  ipratropium (ATROVENT) 0.03 % nasal spray Place 1 spray into both nostrils in the morning and at bedtime. 07/23/21   [provider]  metoprolol tartrate (LOPRESSOR) 25 MG tablet Take 0.5 tablets (12.5 mg total) by mouth 2 (two) times daily as needed (svt flare ups). 09/06/23   Sharlene Dory, PA-C  pantoprazole (PROTONIX) 40 MG tablet Take 40 mg by mouth daily.    [provider]  Prenatal Vit-Fe Fumarate-FA (PRENATAL VITAMIN PO) Take 1 tablet by mouth daily.    [provider]  progesterone (PROMETRIUM) 200 MG capsule Place 1 capsule (200 mg total) vaginally at bedtime. 09/26/23   Jaymes Graff, MD  RABEprazole (ACIPHEX) 20 MG tablet Take 20 mg by mouth daily.    [provider]  sertraline (ZOLOFT) 100 MG tablet Take 150 mg by mouth daily.     [provider]  valACYclovir (VALTREX) 1000 MG tablet Take 1,000 mg by mouth daily. 05/25/23   [provider]  VITAMIN D PO Take 2,000 Units by mouth every morning.    [provider]    Review of Systems: Constitutional: Negative   HENT: Negative   Eyes: Negative   Respiratory: Negative   Cardiovascular: Negative   Gastrointestinal: Negative  Genitourinary: neg for bloody show, neg for LOF   Musculoskeletal: Negative   Skin: Negative   Neurological: Negative   Endo/Heme/Allergies: Negative    Psychiatric/Behavioral: Negative    Physical Exam: VS: Blood pressure 110/66, pulse 70, temperature 97.7 F (36.5 C), temperature source Oral, resp. rate 14, height 5' 7.5" (1.715 m), weight 111.9 kg, SpO2 100%, unknown if currently breastfeeding. AAO x3, no signs of distress Respiratory: Unlabored GU/GI: Abdomen gravid, non-tender, non-distended Extremities: trace edema, negative for pain, tenderness, and cords  Cervical exam: deferred FHR: baseline rate 150 / variability absent / accelerations absent / absent decelerations TOCO: none   Prenatal Transfer Tool  Maternal Diabetes: No Genetic Screening: Declined Maternal Ultrasounds/Referrals: Other:cerebral lateral ventricles was just on the border of normal measurement at 19 wks Fetal Ultrasounds or other Referrals:  Referred to Materal Fetal Medicine  Maternal Substance Abuse:  No Significant Maternal Medications:  Meds include: Progesterone Significant Maternal Lab Results:  Other: GBS unknow due to gestational age Number of Prenatal Visits:greater than 3 verified prenatal visits Maternal Vaccinations:Flu Other Comments:   IVF conception  donor egg    Assessment: 48 y.o. [id number] [redacted]w[redacted]d  Placental abruption FHR category III  Plan:  Stat cesarean section called by Dr. Macon Large See OP note for details  Dr Normand Sloop notified and en route   Roma Schanz DNP, CNM 10/19/2023 7:11 PM

## 2023-10-19 NOTE — MAU Note (Signed)
 Sarah Saunders is a 48 y.o. at [redacted]w[redacted]d here in MAU reporting: she's not had any FM since last night.  States she doesn't recall any FM after breakfast or lunch today.  Denies VB or LOF.  LMP: NA Onset of complaint: today Pain score: 0 Vitals:   10/19/23 1448 10/19/23 1459  BP: 124/75   Pulse: (!) 101   Resp: 18   Temp: 97.7 F (36.5 C)   SpO2: 99% 99%     FHT: 150 bpm  Lab orders placed from triage: None

## 2023-10-19 NOTE — Plan of Care (Signed)

## 2023-10-20 ENCOUNTER — Encounter (HOSPITAL_COMMUNITY): Payer: Self-pay | Admitting: Obstetrics & Gynecology

## 2023-10-20 DIAGNOSIS — D62 Acute posthemorrhagic anemia: Secondary | ICD-10-CM | POA: Diagnosis not present

## 2023-10-20 LAB — COMPREHENSIVE METABOLIC PANEL
ALT: 14 U/L (ref 0–44)
AST: 28 U/L (ref 15–41)
Albumin: 2.2 g/dL — ABNORMAL LOW (ref 3.5–5.0)
Alkaline Phosphatase: 109 U/L (ref 38–126)
Anion gap: 5 (ref 5–15)
BUN: 13 mg/dL (ref 6–20)
CO2: 18 mmol/L — ABNORMAL LOW (ref 22–32)
Calcium: 8.7 mg/dL — ABNORMAL LOW (ref 8.9–10.3)
Chloride: 109 mmol/L (ref 98–111)
Creatinine, Ser: 0.74 mg/dL (ref 0.44–1.00)
GFR, Estimated: >60 mL/min (ref >60)
Glucose, Bld: 118 mg/dL — ABNORMAL HIGH (ref 70–99)
Potassium: 4.4 mmol/L (ref 3.5–5.1)
Sodium: 132 mmol/L — ABNORMAL LOW (ref 135–145)
Total Bilirubin: 0.4 mg/dL (ref 0.0–1.2)
Total Protein: 5.3 g/dL — ABNORMAL LOW (ref 6.5–8.1)

## 2023-10-20 LAB — CBC
HCT: 27.7 % — ABNORMAL LOW (ref 36.0–46.0)
Hemoglobin: 8.6 g/dL — ABNORMAL LOW (ref 12.0–15.0)
MCH: 26.5 pg (ref 26.0–34.0)
MCHC: 31 g/dL (ref 30.0–36.0)
MCV: 85.2 fL (ref 80.0–100.0)
Platelets: 164 10*3/uL (ref 150–400)
RBC: 3.25 MIL/uL — ABNORMAL LOW (ref 3.87–5.11)
RDW: 18.9 % — ABNORMAL HIGH (ref 11.5–15.5)
WBC: 13.2 10*3/uL — ABNORMAL HIGH (ref 4.0–10.5)
nRBC: 0 % (ref 0.0–0.2)

## 2023-10-20 LAB — RPR: RPR Ser Ql: NONREACTIVE

## 2023-10-20 MED ORDER — NALBUPHINE HCL 10 MG/ML IJ SOLN
10.0000 mg | Freq: Four times a day (QID) | INTRAMUSCULAR | Status: DC | PRN
  Administered 2023-10-20 (×2): 10 mg via INTRAVENOUS
  Filled 2023-10-20 (×3): qty 1

## 2023-10-20 MED ORDER — LACTATED RINGERS IV BOLUS
500.0000 mL | Freq: Once | INTRAVENOUS | Status: AC
  Administered 2023-10-20: 500 mL via INTRAVENOUS

## 2023-10-20 MED ORDER — CALCIUM CARBONATE ANTACID 500 MG PO CHEW
1.0000 | CHEWABLE_TABLET | Freq: Three times a day (TID) | ORAL | Status: DC | PRN
  Administered 2023-10-20: 200 mg via ORAL
  Filled 2023-10-20: qty 1

## 2023-10-20 MED ORDER — LACTATED RINGERS IV SOLN: INTRAVENOUS | Status: AC

## 2023-10-20 MED ORDER — ACETAMINOPHEN 325 MG PO TABS
650.0000 mg | ORAL_TABLET | Freq: Four times a day (QID) | ORAL | Status: DC
  Administered 2023-10-20 – 2023-10-23 (×12): 650 mg via ORAL
  Filled 2023-10-20 (×14): qty 2

## 2023-10-20 NOTE — Progress Notes (Signed)
 Subjective: POD# 1 Information for the patient's newborn:  Sarah Saunders, Sarah Saunders [[id number]]  female  Baby's Name [facility] Circumcision in NICU  Reports feeling good, "thankful to everyone" Reports tolerating PO and denies N/V, foley remov Pain controlled with  IV meds, will transition to PO meds today Denies HA/SOB/dizziness  Flatus not passing Vaginal bleeding is normal, no clots     Objective:  VS:  Vitals:   10/19/23 1815 10/19/23 1853 10/19/23 1947 10/20/23 0043  BP: 116/72 110/66 116/66 108/66  Pulse: 74 70 68 68  Resp: 14 14 16 16   Temp:  97.7 F (36.5 C) 97.7 F (36.5 C) 98.1 F (36.7 C)  TempSrc:  Oral Oral Oral  SpO2: 100%  98% 98%  Weight:      Height:        Intake/Output Summary (Last 24 hours) at 10/20/2023 0258 Last data filed at 10/20/2023 0200 Gross per 24 hour  Intake 1150 ml  Output 976 ml  Net 174 ml     Recent Labs    10/19/23 1453  WBC 7.4  HGB 9.7*  HCT 31.8*  PLT 167    Blood type: --/--/O POS (02/28 1455) Rubella: Immune (11/21 0000)    Physical Exam:  General: alert, cooperative, and no distress CV: Regular rate and rhythm or without murmur or extra heart sounds Resp: clear Abdomen: soft, nontender, normal bowel sounds Incision: clean, dry, and intact Perineum:  Uterine Fundus: firm, below umbilicus, nontender Lochia: minimal Ext: 1+ edema, negative for tenderness, pain, and cords, SCD's in place   Assessment/Plan: 48 y.o.   POD# 1. [id number]                  Principal Problem:   Placental abruption in third trimester Active Problems:   Pregnancy   S/P C-section   AMA (advanced maternal age) multigravida 35+, third trimester   Decreased fetal movement affecting management of pregnancy in third trimester, fetus 1   Routine post-op PP care          Advance diet as tolerated Advised warm fluids and ambulation to improve GI motility Lactation support PRN Infant in NICU with bilateral grade 4 intraventricular hemorrhages with  dilated ventricles indicative of hydrocephalus  Anticipate maternal D/C on POD #3  Roma Schanz, DNP, CNM 10/20/2023, 2:58 AM

## 2023-10-20 NOTE — Plan of Care (Signed)
  Problem: Health Behavior/Discharge Planning: Goal: Ability to manage health-related needs will improve Outcome: Progressing   Problem: Clinical Measurements: Goal: Ability to maintain clinical measurements within normal limits will improve Outcome: Progressing Goal: Will remain free from infection Outcome: Progressing Goal: Diagnostic test results will improve Outcome: Progressing Goal: Respiratory complications will improve Outcome: Progressing Goal: Cardiovascular complication will be avoided Outcome: Progressing   Problem: Nutrition: Goal: Adequate nutrition will be maintained Outcome: Progressing   Problem: Coping: Goal: Level of anxiety will decrease Outcome: Progressing   Problem: Elimination: Goal: Will not experience complications related to bowel motility Outcome: Progressing Goal: Will not experience complications related to urinary retention Outcome: Progressing   Problem: Pain Managment: Goal: General experience of comfort will improve and/or be controlled Outcome: Progressing   Problem: Safety: Goal: Ability to remain free from injury will improve Outcome: Progressing   Problem: Skin Integrity: Goal: Risk for impaired skin integrity will decrease Outcome: Progressing   Problem: Education: Goal: Knowledge of the prescribed therapeutic regimen will improve Outcome: Progressing Goal: Understanding of sexual limitations or changes related to disease process or condition will improve Outcome: Progressing Goal: Individualized Educational Video(s) Outcome: Progressing   Problem: Self-Concept: Goal: Communication of feelings regarding changes in body function or appearance will improve Outcome: Progressing   Problem: Skin Integrity: Goal: Demonstration of wound healing without infection will improve Outcome: Progressing   Problem: Education: Goal: Knowledge of condition will improve Outcome: Progressing Goal: Individualized Educational  Video(s) Outcome: Progressing   Problem: Coping: Goal: Ability to identify and utilize available resources and services will improve Outcome: Progressing   Problem: Life Cycle: Goal: Chance of risk for complications during the postpartum period will decrease Outcome: Progressing   Problem: Role Relationship: Goal: Ability to demonstrate positive interaction with newborn will improve Outcome: Progressing   Problem: Skin Integrity: Goal: Demonstration of wound healing without infection will improve Outcome: Progressing

## 2023-10-20 NOTE — Lactation Note (Addendum)
 This note was copied from a baby's chart. Lactation Consultation Note  Patient Name: Sarah Saunders [id number]'[id number] Date: 10/20/2023 Age:46 hours   LC attempted to visit with P3 Mom of baby Sarah "Sarah Saunders" in NICU.  Mom just went to spend time with him.  LC talked with [facility] RN about her feeding/pumping desires and RN states she is not sure as she didn't ask her.  Feeding choice isn't on Mom's chart.  LC aware that baby is critically ill in the NICU.  RN states when Mom returns from being with  her baby, she will ask her and notify LC.  Addendum at 1140 am from [facility] RN, Mom does not wish to start pumping until she knows baby's prognosis.  Baby having an EEG currently.   Lactation will support Mom prn  Judee Clara 10/20/2023, 10:43 AM

## 2023-10-21 MED ORDER — IRON SUCROSE 500 MG IVPB - SIMPLE MED
500.0000 mg | Freq: Once | INTRAVENOUS | Status: DC
  Filled 2023-10-21: qty 275

## 2023-10-21 MED ORDER — SODIUM CHLORIDE 0.9 % IV SOLN
500.0000 mg | Freq: Once | INTRAVENOUS | Status: AC
  Administered 2023-10-21: 500 mg via INTRAVENOUS
  Filled 2023-10-21: qty 25

## 2023-10-21 MED ORDER — SODIUM CHLORIDE 0.9 % IV SOLN
500.0000 mg | Freq: Once | INTRAVENOUS | Status: DC
  Filled 2023-10-21: qty 25

## 2023-10-21 NOTE — Progress Notes (Signed)
 Sarah Saunders [id number] Postpartum Postoperative Day # 2  MARRANDA ARAKELIAN, [id number], [redacted]w[redacted]d, S/P Primary LT Cesarean Section due to pt was admitted on 2/28 at 31 weeks for DFM, absent variability, taken back for a STAT PCS for Cat 3 tracing wit h/o IUFD at 26 week, this pregnancy is IVF with donor egg, pt was to take OR with Dr Macon Large, noted to have placenta abruption, ebl was , hgb drop of 9.7-8.6, asymptomatic, but feeling very fatigue but no sleep r/t has been up in the NICU all night, consent tfor IV Iron transfusion today. Had baby female in NICU intubated, pt has h/o CHTN no meds, asymptomatic, KB pending. NO PCR taken nor CMP. H/O bariatric surgery 2018, SVT, Anemia with IV Iron transfusion during pregnancy, last 1 week ago. GAD on zoloft 150mg  and mood hopefull and appropriate.   Patient Active Problem List   Diagnosis Date Noted   ABLA (acute blood loss anemia) 10/20/2023   Pregnancy 10/19/2023   S/P C-section 10/19/2023   AMA (advanced maternal age) multigravida 35+, third trimester 10/19/2023   Decreased fetal movement affecting management of pregnancy in third trimester, fetus 1 10/19/2023   Placental abruption in third trimester 10/19/2023   Short cervix affecting pregnancy 09/25/2023   Kidney stone complicating pregnancy 07/05/2023   IUFD at 20 weeks or more of gestation 06/19/2022   PSVT (paroxysmal supraventricular tachycardia) (HCC) 05/26/2019   Iron deficiency anemia 12/13/2016   Morbid obesity (HCC) 10/09/2016   Tachycardia 01/08/2012   Benign neoplasm of skin 03/06/2008   POLYCYSTIC OVARIAN DISEASE 02/27/2008   Overweight 02/27/2008     Active Ambulatory Problems    Diagnosis Date Noted   Benign neoplasm of skin 03/06/2008   POLYCYSTIC OVARIAN DISEASE 02/27/2008   Overweight 02/27/2008   Tachycardia 01/08/2012   Morbid obesity (HCC) 10/09/2016   Iron deficiency anemia 12/13/2016   PSVT (paroxysmal supraventricular tachycardia) (HCC) 05/26/2019    IUFD at 20 weeks or more of gestation 06/19/2022   Kidney stone complicating pregnancy 07/05/2023   Short cervix affecting pregnancy 09/25/2023   Resolved Ambulatory Problems    Diagnosis Date Noted   Allergic rhinitis 02/27/2008   Past Medical History:  Diagnosis Date   Anxiety    Pituitary tumor    Polycystic ovary disease    Prolactinoma (HCC)      Subjective: Patient up ad lib, denies syncope or dizziness. Reports consuming regular diet without issues and denies N/V. Patient reports 0 bowel movement + passing flatus.  Denies issues with urination and reports bleeding is "lighter."  Patient is Not feeding r/t baby in NICU intubated.  Desires undecided for postpartum contraception.  Pain is being appropriately managed with use of po meds. Mood is hopefully and appropriate for situations, Using prayer. Pt appears fatigued but admits to being in the NICU all night, recommended pt going down to Ante room , pt eat, rest, get Iron transfusion and pain meds, but supportive of bonding process. Denies HA, RUQ pain or vision changes.    Objective: Patient Vitals for the past 24 hrs:  BP Temp Temp src Pulse Resp SpO2  10/21/23 0504 122/72 -- -- 67 -- --  10/21/23 0502 (!) 108/49 98 F (36.7 C) Oral 71 18 97 %  10/20/23 2122 126/84 97.9 F (36.6 C) Oral 91 19 --  10/20/23 1214 133/73 98.1 F (36.7 C) Oral 68 17 96 %     Physical Exam:  General: alert, cooperative, and appears stated age Mood/Affect: hopefull Lungs:  clear to auscultation, no wheezes, rales or rhonchi, symmetric air entry.  Heart: normal rate, regular rhythm, normal S1, S2, no murmurs, rubs, clicks or gallops. Breast: breasts appear normal, no suspicious masses, no skin or nipple changes or axillary nodes. Abdomen:  + bowel sounds, soft, non-tender Incision: healing well, no significant drainage, no dehiscence, no significant erythema, Honeycomb dressing  Uterine Fundus: firm, involution -1 Lochia: appropriate Skin:  Warm, Dry. DVT Evaluation: No evidence of DVT seen on physical exam. Negative Homan's sign. No cords or calf tenderness. No significant calf/ankle edema.  Labs: Recent Labs    10/19/23 1453 10/20/23 0406  HGB 9.7* 8.6*  HCT 31.8* 27.7*  WBC 7.4 13.2*    CBG (last 3)  No results for input(s): "GLUCAP" in the last 72 hours.   I/O: I/O last 3 completed shifts: In: 830.7 [P.O.:120; I.V.:210.4; IV Piggyback:500.3] Out: 2075 [Urine:2075]   Assessment Postpartum Postoperative Day # 2  Sarah Saunders, [id number], [redacted]w[redacted]d, S/P Primary LT Cesarean Section due to pt was admitted on 2/28 at 31 weeks for DFM, absent variability, taken back for a STAT PCS for Cat 3 tracing wit h/o IUFD at 26 week, this pregnancy is IVF with donor egg, pt was to take OR with Dr Macon Large, noted to have placenta abruption, ebl was , hgb drop of 9.7-8.6, asymptomatic, but feeling very fatigue but no sleep r/t has been up in the NICU all night, consent tfor IV Iron transfusion today. Had baby female in NICU intubated, pt has h/o CHTN no meds, asymptomatic, KB pending. NO PCR taken nor CMP. H/O bariatric surgery 2018, SVT, Anemia with IV Iron transfusion during pregnancy, last 1 week ago. GAD on zoloft 150mg  and mood hopefull and appropriate.  Pt stable. -1 Involution. Not Feeding. Hemodynamically Stable.  Plan: Continue other mgmt as ordered CHTN: monitor BP.  Placenta Abruption: KB pending.  ABLA with IDA: IV Iron transfusion today.  VTE Prophylactics: SCD, ambulated as tolerates.  Pain control: Motrin/Tylenol/Narcotics PRN Education given regarding options for contraception, including barrier methods, injectable contraception, IUD placement, oral contraceptives.  Plan for discharge tomorrow and Lactation consult  Dr. Normand Sloop to be updated on patient status  [facility], FNP-C, PMHNP-BC  [address]  [place], [place] [address]  Cell: [phone]  Office Phone: [phone] Fax:  [phone] 10/21/2023  9:30 AM

## 2023-10-21 NOTE — Progress Notes (Signed)
 Patient requesting to hold PIV placement for now per RN.

## 2023-10-21 NOTE — Lactation Note (Signed)
 This note was copied from a baby's chart.  NICU Lactation Consultation Note  Patient Name: Sarah Saunders [id number] Date: 10/21/2023 Age:46 hours  Reason for consult: Follow-up assessment; NICU baby; Preterm <34wks; Other (Comment); Infant < 5lbs; Maternal endocrine disorder (cHTN, DNR-comfort, AMA) Type of Endocrine Disorder?: PCOS  SUBJECTIVE Visited with family of 50 57/39 weeks old AGA NICU female; baby "Sarah Saunders" got admitted due to prematurity. He's currently on DNR/DNI comfort care due to respiratory failure, neonatal encephalopathy, acute blood loss, and other complications. Sarah Saunders voiced this is her third baby, her first one is 68 y.o; her second one passed away in 25-Oct-2023at 26 weeks. Gave family my condolences and reviewed plans for lactation suppression; Sarah Saunders won't be pumping. Discussed how a prolactinoma would affect the onset of lactogenesis II and could probably lead to over supply. She voiced she was taking bromocriptine an L5 prolactin secretion inhibitor prior the pregnancy to keep her prolactin at bay but discontinue taking it when she found out she was pregnant. She'll check with her doctor to start taking it again since she won't be breastfeeding. Reviewed engorgement prevention/treatment with comfort/non pharmacological measurements. Several family members present. All questions and concerns answered, family to contact [facility] services PRN.  OBJECTIVE Infant data: Mother's Current Feeding Choice: -- (NPO)  O2 Device: Ventilator FiO2 (%): 30 %  Infant feeding assessment No data recorded  Maternal data: [id number] C-Section, Low Transverse Significant Breast History:: pituitary tumor (prolactinoma) in 2018 Current breast feeding challenges:: NICU admission Risk factor for low/delayed milk supply:: C/S, AMA, PCOS, BMI of 38, infant separation  ASSESSMENT Infant: Feeding Status: NPO  Maternal: No data  recorded  INTERVENTIONS/PLAN Interventions: Interventions: Breast feeding basics reviewed; Education Discharge Education: Engorgement and breast care  Plan: Consult Status: NICU follow-up NICU Follow-up type: Maternal D/C visit   [name] 10/21/2023, 3:32 PM

## 2023-10-22 ENCOUNTER — Inpatient Hospital Stay (HOSPITAL_COMMUNITY)
Admission: RE | Admit: 2023-10-22 | Discharge: 2023-10-22 | Disposition: A | Discharge status: Home / Self Care | Payer: BC Managed Care – PPO | Source: Ambulatory Visit | Attending: Hematology | Admitting: Hematology

## 2023-10-22 MED ORDER — NIFEDIPINE ER OSMOTIC RELEASE 30 MG PO TB24
30.0000 mg | ORAL_TABLET | Freq: Every day | ORAL | Status: DC
  Filled 2023-10-22: qty 1

## 2023-10-22 MED ORDER — SENNOSIDES-DOCUSATE SODIUM 8.6-50 MG PO TABS
2.0000 | ORAL_TABLET | Freq: Two times a day (BID) | ORAL | Status: DC
  Administered 2023-10-22 – 2023-10-23 (×2): 2 via ORAL
  Filled 2023-10-22 (×2): qty 2

## 2023-10-22 MED ORDER — NIFEDIPINE ER OSMOTIC RELEASE 30 MG PO TB24: 30.0000 mg | ORAL_TABLET | Freq: Every day | ORAL | Status: DC

## 2023-10-22 MED ORDER — NIFEDIPINE ER OSMOTIC RELEASE 60 MG PO TB24: 60.0000 mg | ORAL_TABLET | Freq: Every day | ORAL | Status: DC

## 2023-10-22 NOTE — Progress Notes (Deleted)
 Post Partum Day 2 Subjective: no complaints, up ad lib, voiding, and tolerating PO No HA, visual changes or abdominal pain  Objective: Blood pressure 127/81, pulse 89, temperature 98 F (36.7 C), temperature source Oral, resp. rate 18, height 5' 7.5" (1.715 m), weight 111.9 kg, SpO2 100%, unknown if currently breastfeeding.  Physical Exam:  General: alert, cooperative, and no distress Lochia: appropriate Uterine Fundus: FF, NT Incision: n/a DVT Evaluation: No evidence of DVT seen on physical exam.  Recent Labs    10/19/23 1453 10/20/23 0406  HGB 9.7* 8.6*  HCT 31.8* 27.7*    Assessment/Plan: Breastfeeding and Circumcision prior to discharge BPs continue to be mildly elevated although pt is asymptomatic Will increase procardia xl to 60mg  daily Plan circumcision prior to discharge May discharge later today pending BPs  LOS: 3 days   Purcell Nails, MD 10/22/2023, 11:13 AM

## 2023-10-22 NOTE — Plan of Care (Signed)
  Problem: Health Behavior/Discharge Planning: Goal: Ability to manage health-related needs will improve Outcome: Progressing   Problem: Clinical Measurements: Goal: Ability to maintain clinical measurements within normal limits will improve Outcome: Progressing Goal: Will remain free from infection Outcome: Progressing Goal: Diagnostic test results will improve Outcome: Progressing Goal: Respiratory complications will improve Outcome: Progressing Goal: Cardiovascular complication will be avoided Outcome: Progressing   Problem: Nutrition: Goal: Adequate nutrition will be maintained Outcome: Progressing   Problem: Coping: Goal: Level of anxiety will decrease Outcome: Progressing   Problem: Elimination: Goal: Will not experience complications related to bowel motility Outcome: Progressing Goal: Will not experience complications related to urinary retention Outcome: Progressing   Problem: Pain Managment: Goal: General experience of comfort will improve and/or be controlled Outcome: Progressing   Problem: Safety: Goal: Ability to remain free from injury will improve Outcome: Progressing   Problem: Skin Integrity: Goal: Risk for impaired skin integrity will decrease Outcome: Progressing   Problem: Education: Goal: Knowledge of the prescribed therapeutic regimen will improve Outcome: Progressing Goal: Understanding of sexual limitations or changes related to disease process or condition will improve Outcome: Progressing Goal: Individualized Educational Video(s) Outcome: Progressing   Problem: Self-Concept: Goal: Communication of feelings regarding changes in body function or appearance will improve Outcome: Progressing   Problem: Skin Integrity: Goal: Demonstration of wound healing without infection will improve Outcome: Progressing   Problem: Education: Goal: Knowledge of condition will improve Outcome: Progressing Goal: Individualized Educational  Video(s) Outcome: Progressing   Problem: Coping: Goal: Ability to identify and utilize available resources and services will improve Outcome: Progressing   Problem: Life Cycle: Goal: Chance of risk for complications during the postpartum period will decrease Outcome: Progressing   Problem: Role Relationship: Goal: Ability to demonstrate positive interaction with newborn will improve Outcome: Progressing   Problem: Skin Integrity: Goal: Demonstration of wound healing without infection will improve Outcome: Progressing

## 2023-10-22 NOTE — Social Work (Signed)
 CSW spoke with the bedside nurse about the parents' current needs for social work. RN stated that she would check with the parents and follow up with CSW if any needs arise.   Vivi Barrack, MSW, LCSW Clinical Social Worker  [phone] 10/22/2023  1:29 PM

## 2023-10-22 NOTE — Lactation Note (Signed)
 This note was copied from a baby's chart.  NICU Lactation Consultation Note  Patient Name: Sarah Saunders [id number] Date: 10/22/2023 Age:46 hours  Reason for consult: Follow-up assessment; NICU baby; Other (Comment); Infant < 5lbs; Maternal endocrine disorder (cHTN, DNR-comfort, AMA) Type of Endocrine Disorder?: PCOS  SUBJECTIVE LC in the room to visit with family but Sarah Saunders was bonding with baby "Sarah Saunders" and doing STS care before extubation. Spoke to [employer] I regarding Sarah Saunders restarting bromocriptine due to prolactinoma; and she voiced she messaged the OBGYN to add it to her MAR. Sarah Saunders might be getting discharged today, all discharge education regarding engorgement prevention/treatment has already been covered, but she's aware of LC services and will contact PRN.   OBJECTIVE Infant data: Mother's Current Feeding Choice: -- (NPO)  O2 Device: Ventilator FiO2 (%): 30 %  Infant feeding assessment No data recorded  Maternal data: [id number] C-Section, Low Transverse Significant Breast History:: pituitary tumor (prolactinoma) in 2018 Current breast feeding challenges:: NICU admission Risk factor for low/delayed milk supply:: C/S, AMA, PCOS, BMI of 38, infant separation  ASSESSMENT Infant: Feeding Status: NPO  Maternal: No data recorded INTERVENTIONS/PLAN Interventions: Interventions: Breast feeding basics reviewed; Education Discharge Education: Engorgement and breast care  Plan: Consult Status: Complete NICU Follow-up type: Maternal D/C visit   [name] 10/22/2023, 12:47 PM

## 2023-10-22 NOTE — Progress Notes (Signed)
 Subjective: Postpartum Day 3: Cesarean Delivery Patient reports tolerating PO and no problems voiding.  Reports constipation and feels like she needs stool softener BID.  Pt said they plan to take the baby off of life support d/t poor prognosis.    Objective: Vital signs in last 24 hours: Temp:  [98 F (36.7 C)-98.7 F (37.1 C)] 98 F (36.7 C) (03/03 0800) Pulse Rate:  [81-89] 89 (03/03 0800) Resp:  [17-18] 18 (03/03 0800) BP: (125-133)/(64-81) 127/81 (03/03 0800) SpO2:  [97 %-100 %] 100 % (03/03 0800)  Physical Exam:  General: alert, cooperative, and no distress Lochia: appropriate Uterine Fundus: FF, NT Incision: n/a DVT Evaluation: No evidence of DVT seen on physical exam.  Recent Labs    10/19/23 1453 10/20/23 0406  HGB 9.7* 8.6*  HCT 31.8* 27.7*    Assessment/Plan: Status post Cesarean section. Doing well postoperatively.  Continue current care. Plan for discharge tomorrow  Purcell Nails, MD 10/22/2023, 12:46 PM

## 2023-10-23 LAB — SURGICAL PATHOLOGY

## 2023-10-23 MED ORDER — OXYCODONE HCL 5 MG PO TABS: 5.0000 mg | ORAL_TABLET | ORAL | 0 refills | Status: DC | PRN

## 2023-10-23 MED ORDER — ACETAMINOPHEN 325 MG PO TABS: 650.0000 mg | ORAL_TABLET | Freq: Four times a day (QID) | ORAL | 0 refills | Status: AC

## 2023-10-23 MED ORDER — IBUPROFEN 600 MG PO TABS: 600.0000 mg | ORAL_TABLET | Freq: Four times a day (QID) | ORAL | 0 refills | Status: DC

## 2023-10-23 NOTE — Progress Notes (Signed)
 Patient requested it added to her chart that she had an tachycardiac episode that is normal for her SVT but wanted it documented for her heart doctor.

## 2023-10-23 NOTE — Discharge Summary (Signed)
 Postpartum Discharge Summary  Date of Service updated3/4/25     Patient Name: Sarah Saunders DOB: 1975/08/26 MRN: [id number]  Date of admission: 10/19/2023 Delivery date:10/19/2023 Delivering provider: Jaynie Collins A Date of discharge: 10/23/2023  Admitting diagnosis: Pregnancy [Z34.90] S/P C-section [D63.875] Intrauterine pregnancy: [redacted]w[redacted]d     Secondary diagnosis:  Principal Problem:   Placental abruption in third trimester Active Problems:   Pregnancy   S/P C-section   AMA (advanced maternal age) multigravida 35+, third trimester   Decreased fetal movement affecting management of pregnancy in third trimester, fetus 1   ABLA (acute blood loss anemia)  Additional problems: see above    Discharge diagnosis: Preterm Pregnancy Delivered                                              Post partum procedures: IV iron Augmentation: N/A Complications: Placental Abruption  Hospital course: Onset of Labor With Unplanned C/S   48 y.o. yo [id number] at [redacted]w[redacted]d was admitted in Latent Labor on 10/19/2023. Patient had a labor course significant for abruption pt presented with cat 3 tracing. The patient went for cesarean section due to  fetal distress  . Delivery details as follows: Membrane Rupture Time/Date: 3:09 PM,10/19/2023  Delivery Method:C-Section, Low Transverse Operative Delivery:N/A Details of operation can be found in separate operative note. Patient had a postpartum course complicated byanemia.  She is ambulating,tolerating a regular diet, passing flatus, and urinating well.  Patient is discharged home in stable condition 10/23/23.  Newborn Data: Birth date:10/19/2023 Birth time:3:09 PM Gender:Female Living status:Living Apgars:2 ,3  Weight:   Magnesium Sulfate received: No BMZ received: No Rhophylac:No MMR:No T-DaP: na Flu: No RSV Vaccine received: No Transfusion:No Immunizations administered: Immunization History  Administered Date(s) Administered   Td 02/27/2008    Tdap 11/13/2021    Physical exam  Vitals:   10/22/23 0800 10/22/23 2055 10/23/23 0409 10/23/23 0811  BP: 127/81 (!) 156/92 129/83 124/72  Pulse: 89 96 94 77  Resp: 18  17 16   Temp: 98 F (36.7 C) 98.1 F (36.7 C) 98 F (36.7 C) 98 F (36.7 C)  TempSrc: Oral Oral Oral Oral  SpO2: 100% 100% 100%   Weight:      Height:       General: alert and cooperative Lochia: appropriate Uterine Fundus: firm Incision: Healing well with no significant drainage DVT Evaluation: Negative Homan's sign. Labs: Lab Results  Component Value Date   WBC 13.2 (H) 10/20/2023   HGB 8.6 (L) 10/20/2023   HCT 27.7 (L) 10/20/2023   MCV 85.2 10/20/2023   PLT 164 10/20/2023      Latest Ref Rng & Units 10/20/2023    4:06 AM  CMP  Glucose 70 - 99 mg/dL 188   BUN 6 - 20 mg/dL 13   Creatinine 4.16 - 1.00 mg/dL 6.06   Sodium 301 - 601 mmol/L 132   Potassium 3.5 - 5.1 mmol/L 4.4   Chloride 98 - 111 mmol/L 109   CO2 22 - 32 mmol/L 18   Calcium 8.9 - 10.3 mg/dL 8.7   Total Protein 6.5 - 8.1 g/dL 5.3   Total Bilirubin 0.0 - 1.2 mg/dL 0.4   Alkaline Phos 38 - 126 U/L 109   AST 15 - 41 U/L 28   ALT 0 - 44 U/L 14    Edinburgh Score:  10/21/2023    6:23 AM  Inocente Salles Postnatal Depression Scale Screening Tool  I have been able to laugh and see the funny side of things. 1  I have looked forward with enjoyment to things. 1  I have blamed myself unnecessarily when things went wrong. 1  I have been anxious or worried for no good reason. 0  I have felt scared or panicky for no good reason. 1  Things have been getting on top of me. 1  I have been so unhappy that I have had difficulty sleeping. 0  I have felt sad or miserable. 1  I have been so unhappy that I have been crying. 1  The thought of harming myself has occurred to me. 0  Edinburgh Postnatal Depression Scale Total 7      After visit meds:  Allergies as of 10/23/2023       Reactions   Butorphanol Hives, Itching        Medication List      STOP taking these medications    ferrous sulfate 325 (65 FE) MG EC tablet   FUSION PO   progesterone 200 MG capsule Commonly known as: PROMETRIUM   RABEprazole 20 MG tablet Commonly known as: ACIPHEX       TAKE these medications    acetaminophen 325 MG tablet Commonly known as: TYLENOL Take 2 tablets (650 mg total) by mouth every 6 (six) hours. What changed:  when to take this reasons to take this   albuterol 108 (90 Base) MCG/ACT inhaler Commonly known as: VENTOLIN HFA Inhale 2 puffs into the lungs every 4 (four) hours as needed for wheezing or shortness of breath.   docusate sodium 100 MG capsule Commonly known as: COLACE Take 1 capsule (100 mg total) by mouth daily.   ibuprofen 600 MG tablet Commonly known as: ADVIL Take 1 tablet (600 mg total) by mouth every 6 (six) hours.   ipratropium 0.03 % nasal spray Commonly known as: ATROVENT Place 1 spray into both nostrils in the morning and at bedtime.   metoprolol tartrate 25 MG tablet Commonly known as: LOPRESSOR Take 0.5 tablets (12.5 mg total) by mouth 2 (two) times daily as needed (svt flare ups).   oxyCODONE 5 MG immediate release tablet Commonly known as: Oxy IR/ROXICODONE Take 1-2 tablets (5-10 mg total) by mouth every 4 (four) hours as needed for moderate pain (pain score 4-6).   pantoprazole 40 MG tablet Commonly known as: PROTONIX Take 40 mg by mouth daily.   PRENATAL VITAMIN PO Take 1 tablet by mouth daily.   sertraline 100 MG tablet Commonly known as: ZOLOFT Take 150 mg by mouth daily.   valACYclovir 1000 MG tablet Commonly known as: VALTREX Take 1,000 mg by mouth daily.   VITAMIN D PO Take 2,000 Units by mouth every morning.   ZyrTEC Allergy 10 MG Caps Generic drug: Cetirizine HCl Take 10 mg by mouth daily.         Discharge home in stable condition Infant Feeding:  na Infant Disposition:morgue Discharge instruction: per After Visit Summary and Postpartum booklet. Activity:  Advance as tolerated. Pelvic rest for 6 weeks.  Diet: routine diet Anticipated Birth Control: Unsure Postpartum Appointment:1 week Additional Postpartum F/U:  iv iron Future Appointments: Future Appointments  Date Time Provider Department Center  11/13/2023  9:30 AM [facility] LAB [facility] None  11/13/2023 10:00 AM Johney Maine, MD [facility] None  12/03/2023 12:00 AM [facility] SCHED ROOM [facility] None   Follow up Visit:  Follow-up  Information     [facility] Follow up in 1 week(s).   Specialty: Obstetrics and Gynecology Contact information: [address] [place] [address] [phone]                    10/23/2023 Michael Litter, MD

## 2023-10-25 NOTE — Telephone Encounter (Signed)
 I am so sorry to hear about this.  We can certainly refer her to see EP for consideration for SVT ablation.  Thanks MJP

## 2023-10-27 ENCOUNTER — Telehealth (HOSPITAL_COMMUNITY): Payer: Self-pay

## 2023-10-27 DIAGNOSIS — Z1331 Encounter for screening for depression: Secondary | ICD-10-CM

## 2023-10-27 NOTE — Telephone Encounter (Signed)
 10/27/2023 0929  Name: Sarah Saunders MRN: [id number] DOB: 17-Aug-1976  Reason for Call:  Transition of Care Hospital Discharge Call  Contact Status: Patient Contact Status: Complete  Language assistant needed:          Follow-Up Questions: Do You Have Any Concerns About Your Health As You Heal From Delivery?: Yes What Concerns Do You Have About Your Health?: Patient asks about how long the incision will take to heal. RN reviewed normal cesarean healing process and time frame. RN talked with patient about incision care and signs of infection to report to the provider. Patient states that she has removed her dressing and that incision has some clear drainage. She declines having any signs of infection at incision. Patient states that she has an appointment with her OB on Teusday 10/30/23 for a postpartum check up. Patient has no other questions or concerns. Do You Have Any Concerns About Your Infants Health?:  (Infant is deceased.)  Edinburgh Postnatal Depression Scale:  In the Past 7 Days: I have been able to laugh and see the funny side of things.: As much as I always could I have looked forward with enjoyment to things.: Rather less than I used to I have blamed myself unnecessarily when things went wrong.: Yes, some of the time I have been anxious or worried for no good reason.: Hardly ever I have felt scared or panicky for no good reason.: Yes, sometimes Things have been getting on top of me.: Yes, sometimes I haven't been coping as well as usual I have been so unhappy that I have had difficulty sleeping.: Not at all I have felt sad or miserable.: Not very often I have been so unhappy that I have been crying.: Only occasionally The thought of harming myself has occurred to me.: Never Inocente Salles Postnatal Depression Scale Total: (!) 10  RN told patient about Maternal Mental Health Resources ([facility], National Maternal Mental Health Hotline, Postpartum Support  International, National Suicide and Crisis Lifeline). Also told patient about [facility] support group offerings. Will e-mail these resources to patient as well.  PHQ2-9 Depression Scale:     Discharge Follow-up: Edinburgh score requires follow up?: Yes Provider notified of Edinburgh score?: Yes Have you already been referred for a counseling appointment?:  (Patient states that she has no appointments set up for counseling. RN explained [facility] referral and virtual counseling appointment. Patient consents to referral for a virtual counseling appointment.) Patient was advised of the following resources:: Bereavement (Grief support for pregnancy, infant and child loss) (Will e-mail bereavement support group information to patient.)  Post-discharge interventions: Maternal Mental Health Resources provided Placed [facility] referral in patient's chart  Signature  Signe Colt

## 2023-10-29 ENCOUNTER — Telehealth: Payer: Self-pay | Admitting: *Deleted

## 2023-10-29 NOTE — Telephone Encounter (Signed)
   Pre-operative Risk Assessment    Patient Name: LATISIA HILAIRE  DOB: [birth year] MRN: [id number]   Date of last office visit: 09/06/23 Jari Favre, [facility] Date of next office visit: 11/20/23 DR. CAMNITZ   Request for Surgical Clearance    Procedure:  RIGHT TOTAL HIP ARTHROPLASTY  Date of Surgery:  Clearance TBD                                Surgeon:  DR. Margarita Rana Surgeon's Group or Practice Name:  [facility] Phone number:  [phone] [address]T [address] Fax number:  [phone]   Type of Clearance Requested:   - Medical ; NONE INDICATED TO BE HELD   Type of Anesthesia:  Spinal   Additional requests/questions:    Elpidio Anis   10/29/2023, 2:52 PM

## 2023-10-30 ENCOUNTER — Inpatient Hospital Stay (HOSPITAL_COMMUNITY)
Admission: AD | Admit: 2023-10-30 | Discharge: 2023-10-30 | Disposition: A | Discharge status: Home / Self Care | Attending: Obstetrics & Gynecology | Admitting: Obstetrics & Gynecology

## 2023-10-30 ENCOUNTER — Encounter (HOSPITAL_COMMUNITY): Payer: Self-pay | Admitting: Obstetrics & Gynecology

## 2023-10-30 DIAGNOSIS — R519 Headache, unspecified: Secondary | ICD-10-CM | POA: Diagnosis not present

## 2023-10-30 DIAGNOSIS — Z8759 Personal history of other complications of pregnancy, childbirth and the puerperium: Secondary | ICD-10-CM

## 2023-10-30 DIAGNOSIS — R030 Elevated blood-pressure reading, without diagnosis of hypertension: Secondary | ICD-10-CM | POA: Diagnosis not present

## 2023-10-30 DIAGNOSIS — O9089 Other complications of the puerperium, not elsewhere classified: Secondary | ICD-10-CM | POA: Diagnosis present

## 2023-10-30 DIAGNOSIS — Z8751 Personal history of pre-term labor: Secondary | ICD-10-CM | POA: Diagnosis not present

## 2023-10-30 LAB — COMPREHENSIVE METABOLIC PANEL
ALT: 18 U/L (ref 0–44)
AST: 22 U/L (ref 15–41)
Albumin: 2.7 g/dL — ABNORMAL LOW (ref 3.5–5.0)
Alkaline Phosphatase: 108 U/L (ref 38–126)
Anion gap: 6 (ref 5–15)
BUN: 12 mg/dL (ref 6–20)
CO2: 19 mmol/L — ABNORMAL LOW (ref 22–32)
Calcium: 8.6 mg/dL — ABNORMAL LOW (ref 8.9–10.3)
Chloride: 117 mmol/L — ABNORMAL HIGH (ref 98–111)
Creatinine, Ser: 0.93 mg/dL (ref 0.44–1.00)
GFR, Estimated: >60 mL/min (ref >60)
Glucose, Bld: 83 mg/dL (ref 70–99)
Potassium: 3.9 mmol/L (ref 3.5–5.1)
Sodium: 142 mmol/L (ref 135–145)
Total Bilirubin: 0.5 mg/dL (ref 0.0–1.2)
Total Protein: 5.8 g/dL — ABNORMAL LOW (ref 6.5–8.1)

## 2023-10-30 LAB — CBC
HCT: 30.8 % — ABNORMAL LOW (ref 36.0–46.0)
Hemoglobin: 9.4 g/dL — ABNORMAL LOW (ref 12.0–15.0)
MCH: 26.4 pg (ref 26.0–34.0)
MCHC: 30.5 g/dL (ref 30.0–36.0)
MCV: 86.5 fL (ref 80.0–100.0)
Platelets: 234 10*3/uL (ref 150–400)
RBC: 3.56 MIL/uL — ABNORMAL LOW (ref 3.87–5.11)
RDW: 19.9 % — ABNORMAL HIGH (ref 11.5–15.5)
WBC: 5 10*3/uL (ref 4.0–10.5)
nRBC: 0 % (ref 0.0–0.2)

## 2023-10-30 MED ORDER — NIFEDIPINE 10 MG PO CAPS: 20.0000 mg | ORAL_CAPSULE | ORAL | Status: DC | PRN

## 2023-10-30 MED ORDER — LABETALOL HCL 5 MG/ML IV SOLN: 40.0000 mg | INTRAVENOUS | Status: DC | PRN

## 2023-10-30 MED ORDER — NIFEDIPINE 10 MG PO CAPS: 10.0000 mg | ORAL_CAPSULE | ORAL | Status: DC | PRN

## 2023-10-30 MED ORDER — ACETAMINOPHEN-CAFFEINE 500-65 MG PO TABS
2.0000 | ORAL_TABLET | Freq: Once | ORAL | Status: AC
  Administered 2023-10-30: 2 via ORAL
  Filled 2023-10-30: qty 2

## 2023-10-30 MED ORDER — CYCLOBENZAPRINE HCL 5 MG PO TABS
10.0000 mg | ORAL_TABLET | Freq: Once | ORAL | Status: AC
  Administered 2023-10-30: 10 mg via ORAL
  Filled 2023-10-30: qty 2

## 2023-10-30 NOTE — Telephone Encounter (Addendum)
 Patient has been seen for Supraventricular Tachycardia and recently delivered a pre-term infant that did not survive. She has asked to proceed with RF ablation and has an appt with Dr. Elberta Fortis 11/20/23.  Dr. Elberta Fortis -  Will you be able to assess patient for surgical clearance at your appt on 4/1? Please route back to P CV DIV PREOP Tereso Newcomer, PA-C    10/30/2023 5:11 PM

## 2023-10-30 NOTE — MAU Note (Signed)
.  Sarah Saunders is a 48 y.o. at Unknown here in MAU reporting: went to [facility] appointment this morning and B/P was elevated  and she has reported having a migraine headache since 4pm yesterday took a BC powder with some relief.   LMP:   Onset of complaint: 4pm Pain score: 5 There were no vitals filed for this visit.   FHT: n/a  Lab orders placed from triage: n/a

## 2023-10-30 NOTE — MAU Provider Note (Signed)
 Chief Complaint:  Headache and Hypertension   HPI   Event Date/Time   First Provider Initiated Contact with Patient 10/30/23 1121      Sarah Saunders is a 48 y.o. [id number] postpartum who presents to maternity admissions reporting elevated blood pressure and headache. Headache started yesterday, she took some BC powder and Tylenol with minimal relief. She states headache is on her right side and radiates from her forehead posteriorly to her neck. She is not eating or drinking as much as she knows she should, it has been difficult emotionally dealing with her pregnancy loss.  Pregnancy Course: report called by Dr. Normand Sloop from [facility], concern for postpartum preeclampsia but patient also noted to be upset in office due to recent pregnancy loss.  Past Medical History:  Diagnosis Date   Allergic rhinitis 02/27/2008   Qualifier: Diagnosis of   By: Tawanna Cooler MD, Eugenio Hoes     IMO SNOMED Dx Update Oct 2024     Anxiety    Morbid obesity (HCC) 10/09/2016   Pituitary tumor    Polycystic ovary disease    Prolactinoma (HCC)    OB History  Gravida Para Term Preterm AB Living  5 3 1 2 2 2   SAB IAB Ectopic Multiple Live Births  0 1 1 0 1    # Outcome Date GA Lbr Len/2nd Weight Sex Type Anes PTL Lv  5 Preterm 10/19/23 [redacted]w[redacted]d   M CS-LTranv Spinal  LIV  4 Preterm 06/20/22 [redacted]w[redacted]d  1151 g M Vag-Breech Other  FD     Birth Comments: extra digit left hand no bone involvement  3 IAB      Biochemical     2 Ectopic           1 Term      Vag-Spont      Past Surgical History:  Procedure Laterality Date   CESAREAN SECTION N/A 10/19/2023   Procedure: CESAREAN SECTION;  Surgeon: Tereso Newcomer, MD;  Location: [facility] LD ORS;  Service: Obstetrics;  Laterality: N/A;   CYSTOSCOPY/URETEROSCOPY/HOLMIUM LASER/STENT PLACEMENT Right 07/06/2023   Procedure: CYSTOSCOPY/RIGHT RETROGRADE URETEROSCOPY/HOLMIUM LASER/STENT PLACEMENT;  Surgeon: Jannifer Hick, MD;  Location: [facility] ORS;  Service: Urology;  Laterality: Right;   JOINT  REPLACEMENT Left 01/13/2021   hip   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)     TUBAL LIGATION     right fallopian tube removed   tube removed     Family History  Problem Relation Age of Onset   Hypertension Father    Heart failure Father    Hypertension Mother    Heart failure Brother    Social History   Tobacco Use   Smoking status: Never   Smokeless tobacco: Never  Vaping Use   Vaping status: Never Used  Substance Use Topics   Alcohol use: No   Drug use: No   Allergies  Allergen Reactions   Butorphanol Hives and Itching   No medications prior to admission.    I have reviewed patient's Past Medical Hx, Surgical Hx, Family Hx, Social Hx, medications and allergies.   ROS  Pertinent items noted in HPI and remainder of comprehensive ROS otherwise negative.   PHYSICAL EXAM  Patient Vitals for the past 24 hrs:  BP Temp Pulse Resp Height Weight  10/30/23 1230 (!) 144/77 -- 63 -- -- --  10/30/23 1215 138/74 -- 66 -- -- --  10/30/23 1200 (!) 124/106 -- 70 -- -- --  10/30/23 1145 132/66 -- 66 -- -- --  10/30/23 1130 129/68 -- 62 -- -- --  10/30/23 1115 138/64 -- 61 -- -- --  10/30/23 1100 (!) 143/72 -- 65 -- -- --  10/30/23 1049 (!) 145/86 97.9 F (36.6 C) 76 18 5' 7.5" (1.715 m) 107.5 kg    Constitutional: Well-developed, well-nourished female in no acute distress.  Cardiovascular: warm and well-perfused Respiratory: normal effort, no problems with respiration noted GI: Abd soft, non-tender, non-distended MS: Extremities nontender, no edema, normal ROM Neurologic: Alert and oriented x 4.      Fetal Tracing: Baseline: Variability: Accelerations:  Decelerations: Toco:    Labs: Results for orders placed or performed during the hospital encounter of 10/30/23 (from the past 24 hours)  Comprehensive metabolic panel     Status: Abnormal   Collection Time: 10/30/23 10:43 AM  Result Value Ref Range   Sodium 142 135 - 145 mmol/L    Potassium 3.9 3.5 - 5.1 mmol/L   Chloride 117 (H) 98 - 111 mmol/L   CO2 19 (L) 22 - 32 mmol/L   Glucose, Bld 83 70 - 99 mg/dL   BUN 12 6 - 20 mg/dL   Creatinine, Ser 1.61 0.44 - 1.00 mg/dL   Calcium 8.6 (L) 8.9 - 10.3 mg/dL   Total Protein 5.8 (L) 6.5 - 8.1 g/dL   Albumin 2.7 (L) 3.5 - 5.0 g/dL   AST 22 15 - 41 U/L   ALT 18 0 - 44 U/L   Alkaline Phosphatase 108 38 - 126 U/L   Total Bilirubin 0.5 0.0 - 1.2 mg/dL   GFR, Estimated >09 >60 mL/min   Anion gap 6 5 - 15  CBC     Status: Abnormal   Collection Time: 10/30/23 10:43 AM  Result Value Ref Range   WBC 5.0 4.0 - 10.5 K/uL   RBC 3.56 (L) 3.87 - 5.11 MIL/uL   Hemoglobin 9.4 (L) 12.0 - 15.0 g/dL   HCT 45.4 (L) 09.8 - 11.9 %   MCV 86.5 80.0 - 100.0 fL   MCH 26.4 26.0 - 34.0 pg   MCHC 30.5 30.0 - 36.0 g/dL   RDW 14.7 (H) 82.9 - 56.2 %   Platelets 234 150 - 400 K/uL   nRBC 0.0 0.0 - 0.2 %    Imaging:  No results found.  MDM & MAU COURSE  MDM: Moderate  MAU Course: Preeclampsia labs within normal limits, high blood pressure resolved on its own without intervention. Excedrin Tension and Flexeril completely resolved headache. Stable to go home and follow up with her doctor.  Orders Placed This Encounter  Procedures   Comprehensive metabolic panel   CBC   Notify physician (specify) Confirmatory reading of BP> 160/110 15 minutes later   Apply Hypertensive Disorders of Pregnancy Care Plan   Measure blood pressure   Discharge patient   Meds ordered this encounter  Medications   AND Linked Order Group    NIFEdipine (PROCARDIA) capsule 10 mg    NIFEdipine (PROCARDIA) capsule 20 mg    NIFEdipine (PROCARDIA) capsule 20 mg    labetalol (NORMODYNE) injection 40 mg   acetaminophen-caffeine (EXCEDRIN TENSION HEADACHE) 500-65 MG per tablet 2 tablet   cyclobenzaprine (FLEXERIL) tablet 10 mg    ASSESSMENT   1. Elevated blood pressure reading   2. History of preterm delivery   3. History of fetal loss     PLAN  Discharge  home in stable condition with return precautions.     Follow-up Information     [facility]  Follow up.   Why: as scheduled for postpartum follow up Contact information: [address] [place] [place] [address] [phone]                Allergies as of 10/30/2023       Reactions   Butorphanol Hives, Itching        Medication List     STOP taking these medications    valACYclovir 1000 MG tablet Commonly known as: VALTREX       TAKE these medications    acetaminophen 325 MG tablet Commonly known as: TYLENOL Take 2 tablets (650 mg total) by mouth every 6 (six) hours.   albuterol 108 (90 Base) MCG/ACT inhaler Commonly known as: VENTOLIN HFA Inhale 2 puffs into the lungs every 4 (four) hours as needed for wheezing or shortness of breath.   docusate sodium 100 MG capsule Commonly known as: COLACE Take 1 capsule (100 mg total) by mouth daily.   ibuprofen 600 MG tablet Commonly known as: ADVIL Take 1 tablet (600 mg total) by mouth every 6 (six) hours.   ipratropium 0.03 % nasal spray Commonly known as: ATROVENT Place 1 spray into both nostrils in the morning and at bedtime.   metoprolol tartrate 25 MG tablet Commonly known as: LOPRESSOR Take 0.5 tablets (12.5 mg total) by mouth 2 (two) times daily as needed (svt flare ups).   oxyCODONE 5 MG immediate release tablet Commonly known as: Oxy IR/ROXICODONE Take 1-2 tablets (5-10 mg total) by mouth every 4 (four) hours as needed for moderate pain (pain score 4-6).   pantoprazole 40 MG tablet Commonly known as: PROTONIX Take 40 mg by mouth daily.   PRENATAL VITAMIN PO Take 1 tablet by mouth daily.   sertraline 100 MG tablet Commonly known as: ZOLOFT Take 150 mg by mouth daily.   VITAMIN D PO Take 2,000 Units by mouth every morning.   ZyrTEC Allergy 10 MG Caps Generic drug: Cetirizine HCl Take 10 mg by mouth daily.        Melida Quitter, PA   [facility]

## 2023-11-08 NOTE — BH Specialist Note (Unsigned)
 Integrated Behavioral Health via Telemedicine Visit  11/16/2023 [name] [id number]  Number of Integrated Behavioral Health Clinician visits: 1- Initial Visit  Session Start time: [phone]   Session End time: 0935  Total time in minutes: 49   Referring Provider: Scheryl Darter, MD Patient/Family location: Home [facility] Provider location: [facility]  All persons participating in visit: Patient Sarah Saunders and [facility] Sarah Saunders   Types of Service: Individual psychotherapy and Video visit  I connected with Sarah Saunders and/or Sarah Saunders's  n/a  via  Telephone or Engineer, civil (consulting)  (Video is Caregility application) and verified that I am speaking with the correct person using two identifiers. Discussed confidentiality: Yes   I discussed the limitations of telemedicine and the availability of in person appointments.  Discussed there is a possibility of technology failure and discussed alternative modes of communication if that failure occurs.  I discussed that engaging in this telemedicine visit, they consent to the provision of behavioral healthcare and the services will be billed under their insurance.  Patient and/or legal guardian expressed understanding and consented to Telemedicine visit: Yes   Presenting Concerns: Patient and/or family reports the following symptoms/concerns: Grieving the loss of her son after being taken off life support at four days old; one day of passive SI after her loss, with no intent and no plan. Pt is taking Zoloft 200mg  and Klonopin; good support from husband; has an upcoming appointment with an ongoing therapist at [facility].  Pt has mixed emotions regarding going back to work on 11/30/23 (works from home); open to implementing additional self-coping strategy today. Pt also cares for her parents in the home with heath issues.    Patient and/or Family's  Strengths/Protective Factors: Social connections, Concrete supports in place (healthy food, safe environments, etc.), Sense of purpose, and Physical Health (exercise, healthy diet, medication compliance, etc.)  Goals Addressed: Patient will:  Reduce symptoms of: anxiety and depression   Increase knowledge and/or ability of: coping skills and healthy habits   Demonstrate ability to: Increase healthy adjustment to current life circumstances, Increase adequate support systems for patient/family, and Continue healthy grieving over loss  Progress towards Goals: Ongoing  Interventions: Interventions utilized:  Mindfulness or Management consultant, Psychoeducation and/or Health Education, Link to [employer], and Supportive Reflection Standardized Assessments completed: Not Needed  Patient and/or Family Response: Patient agrees with treatment plan.   Assessment: Patient currently experiencing Grief.   Patient may benefit from psychoeducation and brief therapeutic interventions regarding coping with symptoms of anxiety and depression related to loss .  Plan: Follow up with behavioral health clinician on : Call [name] at [phone], as needed. Behavioral recommendations:  -Continue taking Zoloft and Klonopin as prescribed by medical provider -Continue allowing time and space to grieve -Continue prioritizing healthy self-care (regular meals, adequate rest; allowing practical help from supportive friends and family) until at least postpartum medical appointment -Consider loss support group as needed at either www.postpartum.net or [email or web address]  -CALM relaxation breathing exercise twice daily (morning; at bedtime with sleep sounds); as needed throughout the day -Continue plan to attend upcoming SEL therapy appointment -Read through information on After Visit Summary; use as needed and discussed  Referral(s): Integrated Art gallery manager (In Clinic) and [employer]  Resources:  Loss support; Caregiver support; [facility]  I discussed the assessment and treatment plan with the patient and/or parent/guardian. They were provided an opportunity to ask questions and all were answered.  They agreed with the plan and demonstrated an understanding of the instructions.   They were advised to call back or seek an in-person evaluation if the symptoms worsen or if the condition fails to improve as anticipated.  [name], LCSW      No data to display             No data to display

## 2023-11-09 ENCOUNTER — Other Ambulatory Visit: Payer: Self-pay | Admitting: Obstetrics and Gynecology

## 2023-11-12 ENCOUNTER — Other Ambulatory Visit: Payer: Self-pay

## 2023-11-12 DIAGNOSIS — D509 Iron deficiency anemia, unspecified: Secondary | ICD-10-CM

## 2023-11-13 ENCOUNTER — Encounter: Payer: Self-pay | Admitting: Hematology

## 2023-11-13 ENCOUNTER — Inpatient Hospital Stay (HOSPITAL_BASED_OUTPATIENT_CLINIC_OR_DEPARTMENT_OTHER): Payer: BC Managed Care – PPO | Admitting: Hematology

## 2023-11-13 ENCOUNTER — Inpatient Hospital Stay: Payer: BC Managed Care – PPO | Attending: Hematology

## 2023-11-13 VITALS — BP 129/85 | HR 88 | Temp 97.7°F | Resp 18 | Wt 236.0 lb

## 2023-11-13 DIAGNOSIS — D509 Iron deficiency anemia, unspecified: Secondary | ICD-10-CM | POA: Insufficient documentation

## 2023-11-13 DIAGNOSIS — R748 Abnormal levels of other serum enzymes: Secondary | ICD-10-CM | POA: Diagnosis not present

## 2023-11-13 DIAGNOSIS — R7401 Elevation of levels of liver transaminase levels: Secondary | ICD-10-CM | POA: Insufficient documentation

## 2023-11-13 DIAGNOSIS — Z9884 Bariatric surgery status: Secondary | ICD-10-CM | POA: Insufficient documentation

## 2023-11-13 LAB — CMP (CANCER CENTER ONLY)
ALT: 43 U/L (ref 0–44)
AST: 43 U/L — ABNORMAL HIGH (ref 15–41)
Albumin: 3.5 g/dL (ref 3.5–5.0)
Alkaline Phosphatase: 154 U/L — ABNORMAL HIGH (ref 38–126)
Anion gap: 4 — ABNORMAL LOW (ref 5–15)
BUN: 16 mg/dL (ref 6–20)
CO2: 26 mmol/L (ref 22–32)
Calcium: 8.7 mg/dL — ABNORMAL LOW (ref 8.9–10.3)
Chloride: 112 mmol/L — ABNORMAL HIGH (ref 98–111)
Creatinine: 0.8 mg/dL (ref 0.44–1.00)
GFR, Estimated: >60 mL/min (ref >60)
Glucose, Bld: 79 mg/dL (ref 70–99)
Potassium: 3.5 mmol/L (ref 3.5–5.1)
Sodium: 142 mmol/L (ref 135–145)
Total Bilirubin: 0.2 mg/dL (ref 0.0–1.2)
Total Protein: 6.2 g/dL — ABNORMAL LOW (ref 6.5–8.1)

## 2023-11-13 LAB — CBC WITH DIFFERENTIAL (CANCER CENTER ONLY)
Abs Immature Granulocytes: 0.01 10*3/uL (ref 0.00–0.07)
Basophils Absolute: 0 10*3/uL (ref 0.0–0.1)
Basophils Relative: 1 %
Eosinophils Absolute: 0.1 10*3/uL (ref 0.0–0.5)
Eosinophils Relative: 2 %
HCT: 34.5 % — ABNORMAL LOW (ref 36.0–46.0)
Hemoglobin: 10.6 g/dL — ABNORMAL LOW (ref 12.0–15.0)
Immature Granulocytes: 0 %
Lymphocytes Relative: 43 %
Lymphs Abs: 1.8 10*3/uL (ref 0.7–4.0)
MCH: 26.8 pg (ref 26.0–34.0)
MCHC: 30.7 g/dL (ref 30.0–36.0)
MCV: 87.3 fL (ref 80.0–100.0)
Monocytes Absolute: 0.2 10*3/uL (ref 0.1–1.0)
Monocytes Relative: 5 %
Neutro Abs: 2.1 10*3/uL (ref 1.7–7.7)
Neutrophils Relative %: 49 %
Platelet Count: 215 10*3/uL (ref 150–400)
RBC: 3.95 MIL/uL (ref 3.87–5.11)
RDW: 17.8 % — ABNORMAL HIGH (ref 11.5–15.5)
WBC Count: 4.2 10*3/uL (ref 4.0–10.5)
nRBC: 0 % (ref 0.0–0.2)

## 2023-11-13 LAB — IRON AND IRON BINDING CAPACITY (CC-WL,HP ONLY)
Iron: 49 ug/dL (ref 28–170)
Saturation Ratios: 16 % (ref 10.4–31.8)
TIBC: 298 ug/dL (ref 250–450)
UIBC: 249 ug/dL (ref 148–442)

## 2023-11-13 LAB — VITAMIN B12: Vitamin B-12: 869 pg/mL (ref 180–914)

## 2023-11-13 LAB — FERRITIN: Ferritin: 120 ng/mL (ref 11–307)

## 2023-11-13 NOTE — Progress Notes (Signed)
 HEMATOLOGY/ONCOLOGY CLINIC NOTE  Date of Service: 11/13/2023  Patient Care Team: Georgianne Fick, MD as PCP - General (Internal Medicine) Elder Negus, MD as PCP - Cardiology (Cardiology)  CHIEF COMPLAINTS/PURPOSE OF CONSULTATION:  Anemia  HISTORY OF PRESENTING ILLNESS: (12/13/2016)    Sarah Saunders is a wonderful 48 y.o. female who has been referred to [place] by Dr .Georgianne Fick, MD  for evaluation and management of iron deficiency anemia not responsive to oral iron therapy.    Patient has a history of prolactinoma, polycystic ovarian disease, morbid obesity, previous history of iron deficiency, GERD on chronic PPI therapy who is undergoing evaluation for bariatric surgery with Dr. Delrae Alfred at [facility].   As part of her pre-bariatric surgery evaluation (plan for duodenal switch surgery) she was noted to have zinc deficiency, iron deficiency. She notes that she has been on ferrous sulfate 1 tablet by mouth twice a day since December 2017 with no improvement in her hemoglobin or ferritin levels.     She notes that she has been on AcipHex for "years". She notes no overt GI bleeding. Notes that she has had no menstrual losses for about 5 months and that her periods have been fairly irregular due to her PCOS.   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 Recent labs with her primary care physician  from 10/30/2016 showed a hemoglobin of 9.5 with an MCV of 77 elevated RDW of 18.5 with normal W BC count of 11.2k and platelets of 312k. Iron studies were not available in referable information that she had an iron saturation of about 8% earlier this year.   No other acute new symptoms at this time. She notes that her bariatric medicine team has told her that they cannot proceed for surgical planning until her anemia and iron deficiency are completely corrected.   She notes that she is on zinc and copper  replacement as per her bariatric medicine team.   Has not had any overt evidence of B12 deficiency.   Currently he is taking ferrous sulfate 1 tablet by mouth twice a day with vitamin C without improvement in her anemia. Patient notes that she has never required previous blood transfusions or IV iron.   We discussed about the role of IV iron replacement in details and the possible etiologies for her iron deficiency anemia including poor absorption due to chronic PPI therapy. She has been on high doses of ibuprofen for one acute pains and is also on SSRI and we discussed that this combination could significantly increase her risk of GI bleeding. She was recommended to discuss this with her primary care physician.  HISTORY OF PRESENTING ILLNESS: (09/18/2023) Sarah Saunders is a wonderful 48 y.o. female who has been referred to [place] by CNM Nigel Bridgeman for evaluation and management of Anemia. Hx of chronic anemia, bariatric surgery. She has had iron infusions prior pregnancy.   Labs from 07/15/2023 showed patient is anemic with hgb of 9.9 g/dL with hct of 16%. No recent iron labs.  Patient was previously seen by [place] in 12/13/2016 for iron deficiency anemia. She had IV Injectafer 750 mg weekly for 2 dose for surgeries. She was recommended to follow-up with [place] 8 weeks after initial visit. Patient did not follow-up.  Patient is currently 26 weeks 4d pregnant. She reports this is her 5th pregnancy. Still birth at 6 week pregnancy in 2023, unsure of the cause. G5P1. Patient  notes she has been doing well overall since our last visit. She had her bariatric surgery and she follows-up with her PCP for Vitamin B-12 levels. She had lost around 170 lbs before her 2023 pregnancy.   Current medications: 500 mg Vitamin B-12 sublingually, pre-natal vitamin, and Vitamin D-3 supplement. NU Iron once daily, started by OGYN around one month ago. She has been tolerating NU Iron well without any toxicities. Denies  calcium supplement.   She complains of significant fatigue and chronic hip pain. She also complains of sleep depravation. She occasionally takes Benadryl to help her sleep.   She denies any new infection issues, fever, chills, night sweats, unexpected weight loss, back pain, chest pain, SOB, abdominal pain, or leg swelling.   Patient notes she has been eating well, gained around 13-14 lbs.   FmHx of anemia - mother. Denies any FmHx of blood disorders.   She has been anemic since she was 18. She denies any PmHx of blood disorder.   She has PmHx of kidney stones. She currently has occasional microscopic blood. She currently has 2 kidney stones in her right kidney.   She denies any IV Iron infusions since 2018.   INTERVAL HISTORY: Sarah Saunders is a wonderful 48 y.o. female who is here for continued evaluation and management of iron-deficiency anemia. She was initially seen by me on 09/18/2023.   Recent hospitalization: Patient was admitted to the hospital on 09/25/2023 due to abdominal pain and short cervix. She was thought to be 1.5 cm dilated on admission, but recheck did not show any dilation. She was given IV hydration and steroids (BMZ) and was discharged on 09/26/2023.   Patient was admitted to the hospital on 10/19/2023 for pregnancy and placental abruption in third trimester at 31 weeks. Patient notes Perinatal Death after 4-days in the NICU. She was discharged on 10/23/2023.   Patient is unaccompanied during this visit. Patient notes she is doing fairly well since her discharge. She notes she is depressed and she has been referred to a mental health counselor by her OBGYN. She denies suicidal indentations.  She also complains of sleep difficulties due to depression and hip pain.   She is planning on getting an hip surgery and her Physician recommended her Hgb be above 11.0 g/dL. Today, Hgb is 10.6 g/dL.   She denies any new infection issues, fever, chills, night sweats,  unexpected weight loss, back pain, chest pain, or leg swelling. She does complain of bilateral hip pain and mild abdominal pain.   MEDICAL HISTORY:  Past Medical History:  Diagnosis Date   Allergic rhinitis 02/27/2008   Qualifier: Diagnosis of   By: Tawanna Cooler MD, Eugenio Hoes     IMO SNOMED Dx Update Oct 2024     Anxiety    Morbid obesity (HCC) 10/09/2016   Pituitary tumor    Polycystic ovary disease    Prolactinoma (HCC)     SURGICAL HISTORY: Past Surgical History:  Procedure Laterality Date   CESAREAN SECTION N/A 10/19/2023   Procedure: CESAREAN SECTION;  Surgeon: Tereso Newcomer, MD;  Location: [facility] LD ORS;  Service: Obstetrics;  Laterality: N/A;   CYSTOSCOPY/URETEROSCOPY/HOLMIUM LASER/STENT PLACEMENT Right 07/06/2023   Procedure: CYSTOSCOPY/RIGHT RETROGRADE URETEROSCOPY/HOLMIUM LASER/STENT PLACEMENT;  Surgeon: Jannifer Hick, MD;  Location: [facility] ORS;  Service: Urology;  Laterality: Right;   JOINT REPLACEMENT Left 01/13/2021   hip   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)     TUBAL LIGATION     right fallopian tube removed  tube removed      SOCIAL HISTORY: Social History   Socioeconomic History   Marital status: Married    Spouse name: Casimiro Needle   Number of children: 1   Years of education: Not on file   Highest education level: Not on file  Occupational History   Occupation: Charity fundraiser    Comment: [employer]  Tobacco Use   Smoking status: Never   Smokeless tobacco: Never  Vaping Use   Vaping status: Never Used  Substance and Sexual Activity   Alcohol use: No   Drug use: No   Sexual activity: Yes    Birth control/protection: None  Other Topics Concern   Not on file  Social History Narrative   Lives with husband.   Social Drivers of Corporate investment banker Strain: Not on file  Food Insecurity: No Food Insecurity (10/19/2023)   Hunger Vital Sign    Worried About Running Out of Food in the Last Year: Never true    Ran Out of  Food in the Last Year: Never true  Transportation Needs: No Transportation Needs (10/19/2023)   PRAPARE - Administrator, Civil Service (Medical): No    Lack of Transportation (Non-Medical): No  Physical Activity: Not on file  Stress: Not on file  Social Connections: Unknown (08/29/2023)   Social Connection and Isolation Panel [NHANES]    Frequency of Communication with Friends and Family: More than three times a week    Frequency of Social Gatherings with Friends and Family: Three times a week    Attends Religious Services: More than 4 times per year    Active Member of Clubs or Organizations: Patient declined    Attends Banker Meetings: Patient declined    Marital Status: Married  Catering manager Violence: Not At Risk (10/19/2023)   Humiliation, Afraid, Rape, and Kick questionnaire    Fear of Current or Ex-Partner: No    Emotionally Abused: No    Physically Abused: No    Sexually Abused: No    FAMILY HISTORY: Family History  Problem Relation Age of Onset   Hypertension Father    Heart failure Father    Hypertension Mother    Heart failure Brother     ALLERGIES:  is allergic to butorphanol.  MEDICATIONS:  Current Outpatient Medications  Medication Sig Dispense Refill   acetaminophen (TYLENOL) 325 MG tablet Take 2 tablets (650 mg total) by mouth every 6 (six) hours. 30 tablet 0   albuterol (PROVENTIL HFA;VENTOLIN HFA) 108 (90 BASE) MCG/ACT inhaler Inhale 2 puffs into the lungs every 4 (four) hours as needed for wheezing or shortness of breath.     Cetirizine HCl (ZYRTEC ALLERGY) 10 MG CAPS Take 10 mg by mouth daily.     docusate sodium (COLACE) 100 MG capsule Take 1 capsule (100 mg total) by mouth daily. 10 capsule 0   ibuprofen (ADVIL) 600 MG tablet Take 1 tablet (600 mg total) by mouth every 6 (six) hours. 30 tablet 0   ipratropium (ATROVENT) 0.03 % nasal spray Place 1 spray into both nostrils in the morning and at bedtime.     metoprolol tartrate  (LOPRESSOR) 25 MG tablet Take 0.5 tablets (12.5 mg total) by mouth 2 (two) times daily as needed (svt flare ups). 60 tablet 1   oxyCODONE (OXY IR/ROXICODONE) 5 MG immediate release tablet Take 1-2 tablets (5-10 mg total) by mouth every 4 (four) hours as needed for moderate pain (pain score 4-6). 30 tablet 0  pantoprazole (PROTONIX) 40 MG tablet Take 40 mg by mouth daily.     Prenatal Vit-Fe Fumarate-FA (PRENATAL VITAMIN PO) Take 1 tablet by mouth daily.     sertraline (ZOLOFT) 100 MG tablet Take 150 mg by mouth daily.      VITAMIN D PO Take 2,000 Units by mouth every morning.     No current facility-administered medications for this visit.    REVIEW OF SYSTEMS:    10 Point review of Systems was done is negative except as noted above.  PHYSICAL EXAMINATION: ECOG PERFORMANCE STATUS: 1 - Symptomatic but completely ambulatory  . Vitals:   11/13/23 0950  BP: 129/85  Pulse: 88  Resp: 18  Temp: 97.7 F (36.5 C)  SpO2: 99%    Filed Weights   11/13/23 0950  Weight: 236 lb (107 kg)    .Body mass index is 36.42 kg/m.  GENERAL:alert, in no acute distress and comfortable SKIN: no acute rashes, no significant lesions EYES: conjunctiva are pink and non-injected, sclera anicteric OROPHARYNX: MMM, no exudates, no oropharyngeal erythema or ulceration NECK: supple, no JVD LYMPH:  no palpable lymphadenopathy in the cervical, axillary or inguinal regions LUNGS: clear to auscultation b/l with normal respiratory effort HEART: regular rate & rhythm ABDOMEN:  normoactive bowel sounds , non tender, not distended. Extremity: no pedal edema PSYCH: alert & oriented x 3 with fluent speech NEURO: no focal motor/sensory deficits  LABORATORY DATA:  I have reviewed the data as listed  .    Latest Ref Rng & Units 10/30/2023   10:43 AM 10/20/2023    4:06 AM 10/19/2023    2:53 PM  CBC  WBC 4.0 - 10.5 K/uL 5.0  13.2  7.4   Hemoglobin 12.0 - 15.0 g/dL 9.4  8.6  9.7   Hematocrit 36.0 - 46.0 % 30.8   27.7  31.8   Platelets 150 - 400 K/uL 234  164  167     .    Latest Ref Rng & Units 10/30/2023   10:43 AM 10/20/2023    4:06 AM 09/25/2023    7:06 PM  CMP  Glucose 70 - 99 mg/dL 83  409  88   BUN 6 - 20 mg/dL 12  13  9    Creatinine 0.44 - 1.00 mg/dL 8.11  9.14  7.82   Sodium 135 - 145 mmol/L 142  132  136   Potassium 3.5 - 5.1 mmol/L 3.9  4.4  3.9   Chloride 98 - 111 mmol/L 117  109  110   CO2 22 - 32 mmol/L 19  18  17    Calcium 8.9 - 10.3 mg/dL 8.6  8.7  8.9   Total Protein 6.5 - 8.1 g/dL 5.8  5.3  6.3   Total Bilirubin 0.0 - 1.2 mg/dL 0.5  0.4  <9.5   Alkaline Phos 38 - 126 U/L 108  109  124   AST 15 - 41 U/L 22  28  24    ALT 0 - 44 U/L 18  14  14     . Lab Results  Component Value Date   IRON 41 09/18/2023   TIBC 609 (H) 09/18/2023   IRONPCTSAT 7 (L) 09/18/2023   (Iron and TIBC)  Lab Results  Component Value Date   FERRITIN 11 09/18/2023   Component     Latest Ref Rng 09/18/2023  Folate, Hemolysate     Not Estab. ng/mL 460.0   HCT     34.0 - 46.6 % 30.6 (L)  Folate, RBC     >498 ng/mL 1,503   T4,Free(Direct)     0.61 - 1.12 ng/dL 1.61   TSH     0.960 - 4.500 uIU/mL 1.038   Vitamin B12     180 - 914 pg/mL 594     Legend: (L) Low  RADIOGRAPHIC STUDIES: I have personally reviewed the radiological images as listed and agreed with the findings in the report. No results found.   ASSESSMENT & PLAN:    Anemia with hgb of 9.9. g/dL with hct of 45%.  Patient is [redacted] weeks pregnant.  Labs today with iron panel.   PLAN: -Discussed lab results from today, 11/13/2023, in detail with the patient. CBC shows low but improved Hgb of 10.6 g/dL with Hct of 40.9%. CMP shows low calcium level of 8.7 mg/dL, elevated Alkaline phosphate level of 154 U/L, and elevated AST level of 43 U/L. -Iron labs are pending.  -Discussed with the patient the option of IV Iron infusions if she is anemic. Pt agrees.  -Patient is planning to get a hip surgery soon, so the goal is to have Hgb  above 11.0 g/dL.  -Recommend to start sublingual B-12 and liquid B-Complex.Marland Kitchen  -Answered all of patient's questions.  IV Feraheme 510 mg weekly x 2 doses @ [address] infusion ASAP RTC with Dr Candise Che with labs in 2 months   FOLLOW-UP: IV Feraheme 510 mg weekly x 2 doses @ [address] infusion ASAP RTC with Dr Candise Che with labs in 2 months  The total time spent in the appointment was 30 minutes* .  All of the patient's questions were answered with apparent satisfaction. The patient knows to call the clinic with any problems, questions or concerns.   Wyvonnia Lora MD MS AAHIVMS [facility] Hematology/Oncology Physician [facility]  .*Total Encounter Time as defined by the Centers for Medicare and Medicaid Services includes, in addition to the face-to-face time of a patient visit (documented in the note above) non-face-to-face time: obtaining and reviewing outside history, ordering and reviewing medications, tests or procedures, care coordination (communications with other health care professionals or caregivers) and documentation in the medical record.   I,Param Shah,acting as a Neurosurgeon for Wyvonnia Lora, MD.,have documented all relevant documentation on the behalf of Wyvonnia Lora, MD,as directed by  Wyvonnia Lora, MD while in the presence of Wyvonnia Lora, MD.  .I have reviewed the above documentation for accuracy and completeness, and I agree with the above. Johney Maine MD

## 2023-11-15 ENCOUNTER — Telehealth: Payer: Self-pay | Admitting: Hematology

## 2023-11-15 NOTE — Telephone Encounter (Signed)
 Spoke with patient confirming upcoming appointment

## 2023-11-16 ENCOUNTER — Ambulatory Visit: Payer: Self-pay | Admitting: Clinical

## 2023-11-16 DIAGNOSIS — F4321 Adjustment disorder with depressed mood: Secondary | ICD-10-CM

## 2023-11-16 NOTE — Patient Instructions (Addendum)
 Center for [facility] [address] [place], [place] [address] [phone] (main office) [phone] (Antwione Picotte's office)   Loss Support Groups:   Postpartum Scientific laboratory [email or web address]  Holly Loss Support Please reach out to Chaplain [employer] [email or web address] or [phone]).  Individual grief counseling is also available free of charge through our [employer].  [facility] in [place] offers a monthly Child Loss Connect group that is primarily conducted online. The group meets on the 2nd Thursday of the month from 6:00-7:15.   Please call [phone] to register.   Wellspring (Caregiver Support) www.well-springsolutions.org  /Emotional Wellbeing Writer Here are a few free apps meant to help you to help yourself.  To find, try searching on the internet to see if the app is offered on Apple/Android devices. If your first choice doesn't come up on your device, the good news is that there are many choices! Play around with different apps to see which ones are helpful to you.    Calm This is an app meant to help increase calm feelings. Includes info, strategies, and tools for tracking your feelings.      Calm Harm  This app is meant to help with self-harm. Provides many 5-minute or 15-min coping strategies for doing instead of hurting yourself.       Healthy Minds Health Minds is a problem-solving tool to help deal with emotions and cope with stress you encounter wherever you are.      MindShift This app can help people cope with anxiety. Rather than trying to avoid anxiety, you can make an important shift and face it.      MY3  MY3 features a support system, safety plan and resources with the goal of offering a tool to use in a time of need.       My Life My Voice  This mood journal offers a simple solution for tracking your thoughts,  feelings and moods. Animated emoticons can help identify your mood.       Relax Melodies Designed to help with sleep, on this app you can mix sounds and meditations for relaxation.      Smiling Mind Smiling Mind is meditation made easy: it's a simple tool that helps put a smile on your mind.        Stop, Breathe & Think  A friendly, simple guide for people through meditations for mindfulness and compassion.  Stop, Breathe and Think Kids Enter your current feelings and choose a "mission" to help you cope. Offers videos for certain moods instead of just sound recordings.       Team Orange The goal of this tool is to help teens change how they think, act, and react. This app helps you focus on your own good feelings and experiences.      The United Station[employer] Box The United Stationers Box (VHB) contains simple tools to help patients with coping, relaxation, distraction, and positive thinking.    [facility]  [address] [place], [place] [address] [phone] or [phone] [facility] 24/7 FOR ANYONE [address], [place], [place]  [phone] Fax: [phone] [email or web address] *Interpreters available *Accepts all insurance and uninsured for Urgent Care needs *Accepts Medicaid and uninsured for outpatient treatment (below)    ONLY FOR [facility]  Below:   Outpatient New Patient Assessment/Therapy Walk-ins:        Monday -Thursday 8am until slots are full.  Every Friday 1pm-4pm  (first come, first served)                   New Patient Psychiatry/Medication Management        Monday-Friday 8am-11am (first come, first served)              For all walk-ins we ask that you arrive by 7:15am, because patients will be seen in the order of arrival.

## 2023-11-19 ENCOUNTER — Telehealth (HOSPITAL_COMMUNITY): Payer: Self-pay | Admitting: *Deleted

## 2023-11-19 ENCOUNTER — Encounter: Payer: Self-pay | Admitting: Hematology

## 2023-11-19 ENCOUNTER — Telehealth: Payer: Self-pay | Admitting: Pharmacy Technician

## 2023-11-19 ENCOUNTER — Encounter (HOSPITAL_COMMUNITY): Payer: Self-pay

## 2023-11-19 NOTE — Telephone Encounter (Signed)
 Auth Submission: APPROVED Site of care: Site of care: [facility] Payer: BCBS Medication & CPT/J Code(s) submitted: Feraheme (ferumoxytol) [id number] Route of submission (phone, fax, portal):  Phone # Fax # Auth type: Buy/Bill PB Units/visits requested: 2 DOSES Reference number: [id number] Approval from: 11/14/23 to 02/16/24

## 2023-11-19 NOTE — Telephone Encounter (Signed)
 Preadmission screen

## 2023-11-20 ENCOUNTER — Ambulatory Visit (INDEPENDENT_AMBULATORY_CARE_PROVIDER_SITE_OTHER)

## 2023-11-20 ENCOUNTER — Ambulatory Visit: Attending: Cardiology | Admitting: Cardiology

## 2023-11-20 ENCOUNTER — Encounter: Payer: Self-pay | Admitting: Cardiology

## 2023-11-20 VITALS — BP 124/90 | HR 86 | Ht 67.5 in | Wt 234.0 lb

## 2023-11-20 VITALS — BP 138/96 | HR 78 | Temp 98.1°F | Resp 16 | Ht 67.0 in | Wt 241.0 lb

## 2023-11-20 DIAGNOSIS — Z01810 Encounter for preprocedural cardiovascular examination: Secondary | ICD-10-CM

## 2023-11-20 DIAGNOSIS — D509 Iron deficiency anemia, unspecified: Secondary | ICD-10-CM | POA: Diagnosis not present

## 2023-11-20 DIAGNOSIS — I471 Supraventricular tachycardia, unspecified: Secondary | ICD-10-CM | POA: Diagnosis not present

## 2023-11-20 DIAGNOSIS — Z01812 Encounter for preprocedural laboratory examination: Secondary | ICD-10-CM

## 2023-11-20 MED ORDER — DIPHENHYDRAMINE HCL 25 MG PO CAPS
25.0000 mg | ORAL_CAPSULE | Freq: Once | ORAL | Status: AC
  Administered 2023-11-20: 25 mg via ORAL
  Filled 2023-11-20: qty 1

## 2023-11-20 MED ORDER — ACETAMINOPHEN 325 MG PO TABS
650.0000 mg | ORAL_TABLET | Freq: Once | ORAL | Status: AC
  Administered 2023-11-20: 650 mg via ORAL
  Filled 2023-11-20: qty 2

## 2023-11-20 MED ORDER — FERUMOXYTOL INJECTION 510 MG/17 ML
510.0000 mg | Freq: Once | INTRAVENOUS | Status: AC
  Administered 2023-11-20: 510 mg via INTRAVENOUS
  Filled 2023-11-20: qty 17

## 2023-11-20 NOTE — Progress Notes (Signed)
 Diagnosis: Iron Deficiency Anemia  Provider:  Chilton Greathouse MD  Procedure: IV Infusion  IV Type: Peripheral, IV Location: R Antecubital  Feraheme (Ferumoxytol), Dose: 510 mg  Infusion Start Time: 1526  Infusion Stop Time: 1547  Post Infusion IV Care: Observation period completed and Peripheral IV Discontinued  Discharge: Condition: Good, Destination: Home . AVS Declined  Performed by:  Rico Ala, LPN

## 2023-11-20 NOTE — Patient Instructions (Addendum)
 Medication Instructions:  Your physician recommends that you continue on your current medications as directed. Please refer to the Current Medication list given to you today.  *If you need a refill on your cardiac medications before your next appointment, please call your pharmacy*   Lab Work: None ordered    Testing/Procedures: Your physician has recommended that you have an ablation. Catheter ablation is a medical procedure used to treat some cardiac arrhythmias (irregular heartbeats). During catheter ablation, a long, thin, flexible tube is put into a blood vessel in your groin (upper thigh), or neck. This tube is called an ablation catheter. It is then guided to your heart through the blood vessel. Radio frequency waves destroy small areas of heart tissue where abnormal heartbeats may cause an arrhythmia to start.   You will scheduled for 01/25/24, arrive to [facility] at 10:30 am.  We will be in contact at a later date to go over procedure instructions.  (1 week of recovery post procedure)   Follow-Up: At [facility], you and your health needs are our priority.  As part of our continuing mission to provide you with exceptional heart care, we have created designated Provider Care Teams.  These Care Teams include your primary Cardiologist (physician) and Advanced Practice Providers (APPs -  Physician Assistants and Nurse Practitioners) who all work together to provide you with the care you need, when you need it.  Your next appointment:   4 week(s)  The format for your next appointment:   In Person  Provider:   You may see Dr. Elberta Fortis or one of the following Advanced Practice Providers on your designated Care Team:   Francis Dowse, PA-C Casimiro Needle "[place]" Penuelas, PA-C Canary Brim, [place]    Thank you for choosing [facility]!!   Dory Horn, RN [phone]  Other Instructions  Cardiac Ablation Cardiac ablation is a procedure to destroy (ablate) heart tissue that  is sending bad signals. These bad signals cause the heart to beat very fast or in a way that is not normal. Destroying some tissues can help make the heart rhythm normal. Tell your doctor about: Any allergies you have. All medicines you are taking. These include vitamins, herbs, eye drops, creams, and over-the-counter medicines. Any problems you or family members have had with anesthesia. Any bleeding problems you have. Any surgeries you have had. Any medical conditions you have. Whether you are pregnant or may be pregnant. What are the risks? Your doctor will talk with you about risks. These may include: Infection. Bruising and bleeding. Stroke or blood clots. Damage to nearby areas of your body. Allergies to medicines or dyes. Needing a pacemaker if the heart gets damaged. A pacemaker helps the heart beat normally. The procedure not working. What happens before the procedure? Medicines Ask your doctor about changing or stopping: Your normal medicines. Vitamins, herbs, and supplements. Over-the-counter medicines. Do not take aspirin or ibuprofen unless you are told to. General instructions Follow instructions from your doctor about what you may eat and drink. If you will be going home right after the procedure, plan to have a responsible adult: Take you home from the hospital or clinic. You will not be allowed to drive. Care for you for the time you are told. Ask your doctor what steps will be taken to prevent the spread of germs. What happens during the procedure?  An IV tube will be put into one of your veins. You may be given: A sedative. This helps you relax.  Anesthesia. This will: Numb certain areas of your body. The skin on your neck or groin will be numbed. A cut (incision) will be made in your neck or groin. A needle will be put through the cut and into a large vein. The small, thin tube (catheter) will be put into the needle. The tube will be moved to your heart. A  type of X-ray (fluoroscopy) will be used to help guide the tube. It will also show constant images of the heart on a screen. Dye may be put through the tube. This helps your doctor see your heart. An electric current will be sent from the tube to destroy heart tissue in certain areas. The tube will be taken out. Pressure will be held on your cut. This helps stop bleeding. A bandage (dressing) will be put over your cut. The procedure may vary among doctors and hospitals. What happens after the procedure? You will be monitored until you leave the hospital or clinic. This includes checking your blood pressure, heart rate and rhythm, breathing rate, and blood oxygen level. Your cut will be checked for bleeding. You will need to lie still for a few hours. If your groin was used, you will need to keep your leg straight for a few hours after the small, thin tube is removed. This information is not intended to replace advice given to you by your health care provider. Make sure you discuss any questions you have with your health care provider. Document Revised: 01/24/2022 Document Reviewed: 01/24/2022 Elsevier Patient Education  2024 [employer].

## 2023-11-20 NOTE — Progress Notes (Signed)
 Electrophysiology Office Note:   Date:  11/20/2023  ID:  Sarah Saunders, DOB July 01, 1976, MRN [id number]  Primary Cardiologist: Elder Negus, MD Primary Heart Failure: None Electrophysiologist: Earl Losee Jorja Loa, MD      History of Present Illness:   Sarah Saunders is a 48 y.o. female with h/o SVT, obesity post bariatric surgery seen today for  for Electrophysiology evaluation of SVT at the request of Jari Favre.    She was diagnosed in 2023.  She went to use the restroom and around 2:30 AM.  She began having palpitations which did not improved with vagal maneuvers.  The episode lasted for 20 minutes.  SVT was noted on the EKG via EMS.  Resolved after 6 mg of adenosine.  Today, denies symptoms of palpitations, chest pain, shortness of breath, orthopnea, PND, lower extremity edema, claudication, dizziness, presyncope, syncope, bleeding, or neurologic sequela. The patient is tolerating medications without difficulties.  Today she feels well.  She has had a increased burden of episodes over the last few months.  She used to be able to do vagal maneuvers to terminate the tachycardia.  More recently, her husband has had to pour ice water over her head.  This has helped with her tachycardia episodes.  She was taking daily metoprolol and diltiazem, but would prefer to avoid long-term medications.  Review of systems complete and found to be negative unless listed in HPI.   EP Information / Studies Reviewed:    EKG is ordered today. Personal review as below.  EKG Interpretation Date/Time:  Tuesday November 20 2023 10:03:07 EDT Ventricular Rate:  86 PR Interval:  146 QRS Duration:  84 QT Interval:  368 QTC Calculation: 440 R Axis:   73  Text Interpretation: Normal sinus rhythm Normal ECG When compared with ECG of 06-Sep-2023 11:04, No significant change was found Confirmed by [employer], Lanell Dubie ([id number]) on 11/20/2023 10:06:00 AM     Risk Assessment/Calculations:             Physical  Exam:   VS:  BP (!) 124/90 (BP Location: Left Arm, Patient Position: Sitting, Cuff Size: Large)   Pulse 86   Ht 5' 7.5" (1.715 m)   Wt 234 lb (106.1 kg)   SpO2 97%   BMI 36.11 kg/m    Wt Readings from Last 3 Encounters:  11/20/23 234 lb (106.1 kg)  11/13/23 236 lb (107 kg)  10/30/23 237 lb (107.5 kg)     GEN: Well nourished, well developed in no acute distress NECK: No JVD; No carotid bruits CARDIAC: Regular rate and rhythm, no murmurs, rubs, gallops RESPIRATORY:  Clear to auscultation without rales, wheezing or rhonchi  ABDOMEN: Soft, non-tender, non-distended EXTREMITIES:  No edema; No deformity   ASSESSMENT AND PLAN:    1.  SVT: Has had 4 episodes during pregnancy, all resolving spontaneously.  She has as needed metoprolol but has not needed to take it.  An increased burden over the last few months.  Has responded to adenosine in the past.  At this point, she would prefer to avoid medication management.  Due to that, we Dalessandro Baldyga plan for ablation.  Risk and benefits have been discussed.  She understands these risks and is agreed to the procedure.  Risk, benefits, and alternatives to EP study and radiofrequency/pulse field ablation for afib were also discussed in detail today. These risks include but are not limited to stroke, bleeding, vascular damage, tamponade, perforation, damage to the esophagus, lungs, and other structures, pulmonary vein stenosis,  worsening renal function, and death. The patient understands these risk and wishes to proceed.   2.  Preoperative evaluation: Patient has plans for SVT ablation.  She also needs a hip replacement.  She is able to do her daily activities.  She would be at intermediate risk for this intermediate risk procedure.  No further cardiac testing is necessary at this time.  If she does have episodes of SVT around the time of surgery, Keylon Labelle treat with adenosine.  Follow up with Dr. Elberta Fortis as usual post procedure  Signed, Djon Tith Jorja Loa, MD

## 2023-11-21 ENCOUNTER — Telehealth (HOSPITAL_COMMUNITY): Payer: Self-pay | Admitting: *Deleted

## 2023-11-21 NOTE — Telephone Encounter (Signed)
 Preadmission screen

## 2023-11-22 ENCOUNTER — Telehealth (HOSPITAL_COMMUNITY): Payer: Self-pay | Admitting: *Deleted

## 2023-11-22 NOTE — Telephone Encounter (Signed)
 Preadmission screen

## 2023-11-26 ENCOUNTER — Telehealth (HOSPITAL_COMMUNITY): Payer: Self-pay | Admitting: *Deleted

## 2023-11-26 NOTE — Telephone Encounter (Signed)
 Preadmission screen

## 2023-11-28 ENCOUNTER — Ambulatory Visit (INDEPENDENT_AMBULATORY_CARE_PROVIDER_SITE_OTHER)

## 2023-11-28 VITALS — BP 139/89 | HR 75 | Temp 97.7°F | Resp 18 | Ht 67.0 in | Wt 239.6 lb

## 2023-11-28 DIAGNOSIS — D509 Iron deficiency anemia, unspecified: Secondary | ICD-10-CM

## 2023-11-28 MED ORDER — SODIUM CHLORIDE 0.9 % IV SOLN
510.0000 mg | Freq: Once | INTRAVENOUS | Status: AC
  Administered 2023-11-28: 510 mg via INTRAVENOUS
  Filled 2023-11-28: qty 17

## 2023-11-28 MED ORDER — DIPHENHYDRAMINE HCL 25 MG PO CAPS: 25.0000 mg | ORAL_CAPSULE | Freq: Once | ORAL | Status: DC

## 2023-11-28 MED ORDER — ACETAMINOPHEN 325 MG PO TABS
650.0000 mg | ORAL_TABLET | Freq: Once | ORAL | Status: AC
  Administered 2023-11-28: 650 mg via ORAL
  Filled 2023-11-28: qty 2

## 2023-11-28 NOTE — Progress Notes (Signed)
 Diagnosis: Acute Anemia  Provider:  Chilton Greathouse MD  Procedure: IV Infusion  IV Type: Peripheral, IV Location: L Hand  Feraheme (Ferumoxytol), Dose: 510 mg  Infusion Start Time: 1011  Infusion Stop Time: 1030  Post Infusion IV Care: Observation period completed and Peripheral IV Discontinued  Discharge: Condition: Good, Destination: Home . AVS Declined  Performed by:  Nat Math, RN

## 2023-12-03 ENCOUNTER — Inpatient Hospital Stay (HOSPITAL_COMMUNITY): Payer: BC Managed Care – PPO

## 2023-12-03 ENCOUNTER — Inpatient Hospital Stay (HOSPITAL_COMMUNITY): Admission: RE | Admit: 2023-12-03 | Payer: BC Managed Care – PPO | Source: Ambulatory Visit

## 2023-12-03 ENCOUNTER — Inpatient Hospital Stay (HOSPITAL_COMMUNITY): Admission: RE | Admit: 2023-12-03 | Source: Home / Self Care | Admitting: Family Medicine

## 2023-12-09 ENCOUNTER — Other Ambulatory Visit: Payer: Self-pay | Admitting: Physician Assistant

## 2023-12-09 DIAGNOSIS — I471 Supraventricular tachycardia, unspecified: Secondary | ICD-10-CM

## 2024-01-09 ENCOUNTER — Telehealth: Payer: Self-pay

## 2024-01-09 DIAGNOSIS — I471 Supraventricular tachycardia, unspecified: Secondary | ICD-10-CM

## 2024-01-09 NOTE — Telephone Encounter (Signed)
 Pt's SVT Ablation with Dr. Lawana Pray has been moved from 6/6 to 8/26 at 12:30 PM due to pt having Hip Surgery on 5/29. Per Dr. Lawana Pray, he requested we move her out 2 months. He would like for her to heal and be more ambulatory prior to her ablation.   She will have updated labs on 8/5.   I will send updated instruction letter via MyChart.

## 2024-01-11 ENCOUNTER — Other Ambulatory Visit: Payer: Self-pay

## 2024-01-11 DIAGNOSIS — D509 Iron deficiency anemia, unspecified: Secondary | ICD-10-CM

## 2024-01-15 ENCOUNTER — Inpatient Hospital Stay

## 2024-01-15 ENCOUNTER — Inpatient Hospital Stay: Admitting: Hematology

## 2024-01-25 ENCOUNTER — Encounter (HOSPITAL_COMMUNITY): Payer: Self-pay | Admitting: Orthopedic Surgery

## 2024-01-25 NOTE — Progress Notes (Signed)
 Attempted to obtain medical history via telephone, unable to reach at this time. HIPAA compliant voicemail message left requesting return call to pre surgical testing department.

## 2024-01-25 NOTE — H&P (Signed)
 PREOPERATIVE H&P  Chief Complaint: INFECTED RIGHT HIP PROSTHESIS  HPI: Sarah Saunders is a 48 y.o. female who presents with a diagnosis of INFECTED RIGHT HIP PROSTHESIS. Symptoms are rated as moderate to severe, and have been worsening.  This is significantly impairing activities of daily living.  She has elected for surgical management.   Past Medical History:  Diagnosis Date   Allergic rhinitis 02/27/2008   Qualifier: Diagnosis of   By: Ena Harries MD, Viktoria Gray     IMO SNOMED Dx Update Oct 2024     Anemia    iron  def   Antepartum fetal death    Anxiety    Genital herpes    History of bariatric surgery    History of left hip replacement    History of urinary stone    Morbid obesity (HCC) 10/09/2016   Pituitary tumor    Polycystic ovary disease    Prolactinoma (HCC)    SVT (supraventricular tachycardia) (HCC)    Vitamin D deficiency    Past Surgical History:  Procedure Laterality Date   CESAREAN SECTION N/A 10/19/2023   Procedure: CESAREAN SECTION;  Surgeon: Julianne Octave, MD;  Location: [facility] LD ORS;  Service: Obstetrics;  Laterality: N/A;   CYSTOSCOPY/URETEROSCOPY/HOLMIUM LASER/STENT PLACEMENT Right 07/06/2023   Procedure: CYSTOSCOPY/RIGHT RETROGRADE URETEROSCOPY/HOLMIUM LASER/STENT PLACEMENT;  Surgeon: Lahoma Pigg, MD;  Location: [facility] ORS;  Service: Urology;  Laterality: Right;   JOINT REPLACEMENT Left 01/13/2021   hip   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)     TUBAL LIGATION     right fallopian tube removed   tube removed     Social History   Socioeconomic History   Marital status: Married    Spouse name: Bambi Lever   Number of children: 1   Years of education: Not on file   Highest education level: Not on file  Occupational History   Occupation: Charity fundraiser    Comment: [employer]  Tobacco Use   Smoking status: Never   Smokeless tobacco: Never  Vaping Use   Vaping status: Never Used  Substance and Sexual Activity   Alcohol  use: No   Drug use: No   Sexual activity: Yes    Birth control/protection: None  Other Topics Concern   Not on file  Social History Narrative   Lives with husband.   Social Drivers of Corporate investment banker Strain: Not on file  Food Insecurity: Low Risk  (11/19/2023)   Received from [facility]   Hunger Vital Sign    Worried About Running Out of Food in the Last Year: Never true    Ran Out of Food in the Last Year: Never true  Transportation Needs: No Transportation Needs (11/19/2023)   Received from [facility]    In the past 12 months, has lack of reliable transportation kept you from medical appointments, meetings, work or from getting things needed for daily living? : No  Physical Activity: Not on file  Stress: Not on file  Social Connections: Unknown (08/29/2023)   Social Connection and Isolation Panel [NHANES]    Frequency of Communication with Friends and Family: More than three times a week    Frequency of Social Gatherings with Friends and Family: Three times a week    Attends Religious Services: More than 4 times per year    Active Member of Clubs or Organizations: Patient declined    Attends Banker Meetings: Patient declined    Marital  Status: Married   Family History  Problem Relation Age of Onset   Hypertension Father    Heart failure Father    Hypertension Mother    Heart failure Brother    Allergies  Allergen Reactions   Butorphanol Hives and Itching   Prior to Admission medications   Medication Sig Start Date End Date Taking? Authorizing Provider  acetaminophen  (TYLENOL ) 325 MG tablet Take 2 tablets (650 mg total) by mouth every 6 (six) hours. 10/23/23   Marylu Soda, MD  albuterol  (PROVENTIL  HFA;VENTOLIN  HFA) 108 (90 BASE) MCG/ACT inhaler Inhale 2 puffs into the lungs every 4 (four) hours as needed for wheezing or shortness of breath.    [provider]  Cetirizine HCl (ZYRTEC ALLERGY) 10 MG CAPS Take 10 mg  by mouth daily.    [provider]  clonazePAM (KLONOPIN) 0.5 MG tablet Take 0.5 mg by mouth daily. 11/13/23   [provider]  docusate sodium  (COLACE) 100 MG capsule Take 1 capsule (100 mg total) by mouth daily. 09/27/23   Marylu Soda, MD  ibuprofen  (ADVIL ) 600 MG tablet Take 1 tablet (600 mg total) by mouth every 6 (six) hours. 10/23/23   Dillard, Naima, MD  ipratropium (ATROVENT ) 0.03 % nasal spray Place 1 spray into both nostrils in the morning and at bedtime. 07/23/21   [provider]  metoprolol  tartrate (LOPRESSOR ) 25 MG tablet TAKE 0.5 TABLETS (12.5 MG TOTAL) BY MOUTH 2 (TWO) TIMES DAILY AS NEEDED (SVT FLARE UPS). 12/11/23   Patwardhan, Kaye Parsons, MD  oxyCODONE  (OXY IR/ROXICODONE ) 5 MG immediate release tablet Take 1-2 tablets (5-10 mg total) by mouth every 4 (four) hours as needed for moderate pain (pain score 4-6). 10/23/23   Dillard, Naima, MD  pantoprazole  (PROTONIX ) 40 MG tablet Take 40 mg by mouth daily.    [provider]  Prenatal Vit-Fe Fumarate-FA (PRENATAL VITAMIN PO) Take 1 tablet by mouth daily.    [provider]  sertraline  (ZOLOFT ) 100 MG tablet Take 150 mg by mouth daily.     [provider]  VITAMIN D PO Take 2,000 Units by mouth every morning.    [provider]     Positive ROS: All other systems have been reviewed and were otherwise negative with the exception of those mentioned in the HPI and as above.  Physical Exam: General: Alert, no acute distress Cardiovascular: No pedal edema Respiratory: No cyanosis, no use of accessory musculature GI: No organomegaly, abdomen is soft and non-tender Skin: No lesions in the area of chief complaint Neurologic: Sensation intact distally Psychiatric: Patient is competent for consent with normal mood and affect Lymphatic: No axillary or cervical lymphadenopathy  MUSCULOSKELETAL: TTP anterior thigh around incision site, small opening at inferior pole of incision with  dark yellow fluid discharge, rest of incision is intact, mild maceration to skin edges, painful hip ROM, induration present, ecchymosis present, no erythema, NVI   Imaging: U/S exam shows a collection of fluid in the extra-articular subcutaneous space that does not connect to the hip joint   Assessment: INFECTED RIGHT HIP PROSTHESIS  Plan: Plan for Procedure(s): REVISION, TOTAL ARTHROPLASTY, HIP, ANTERIOR APPROACH  The risks benefits and alternatives were discussed with the patient including but not limited to the risks of nonoperative treatment, versus surgical intervention including infection, bleeding, nerve injury,  blood clots, cardiopulmonary complications, morbidity, mortality, among others, and they were willing to proceed.   Weightbearing: WBAT RLE Orthopedic devices: none Showering: POD 3 Dressing: reinforce PRN Medicines: ASA, Oxy, Tylenol ,  Mobic, Robaxin , Zofran  Will likely need ID consult and possible PICC line  Discharge: OBS Follow up: 02/06/24 at 3:45pm    Lore Rode, PA-C Office [phone] 01/25/2024 2:27 PM

## 2024-01-25 NOTE — Progress Notes (Addendum)
 For Anesthesia: PCP - Virgle Grime, MD last office visit note 11/30/23 Cody Das, MD as PCP - Cardiology  EP - Will Cortland Ding, MD last office visit note 11/20/23 in [facility]  Bowel Prep reminder: N/A  Chest x-ray - greater than 1 year in [facility] EKG - 11/20/23 (NSR) Stress Test - 04/06/2019 in [facility] ECHO - 09/11/23 in [facility] Cardiac Cath - N/A Pacemaker/ICD device last checked:N/A Pacemaker orders received: N/A Device Rep notified: N/A  Spinal Cord Stimulator: N/A  Sleep Study - 07/29/2013 in [facility] CPAP - N/A  Fasting Blood Sugar - N/A Checks Blood Sugar __N/A___ times a day Date and result of last Hgb A1c-N/A  Blood Thinner Instructions: N/A Aspirin Instructions: 81 mg Last Dose: 01/25/2024 at 830 AM  Activity level: Can go up a flight of stairs and activities of daily living without stopping and without chest pain and/or shortness of breath      Anesthesia review: SVT  Patient denies shortness of breath, fever, cough and chest pain at PAT appointment   Patient verbalized understanding of instructions that were given to them at the PAT appointment. Patient was also instructed that they will need to review over the PAT instructions again at home before surgery.

## 2024-01-28 ENCOUNTER — Encounter (HOSPITAL_COMMUNITY): Payer: Self-pay

## 2024-01-28 ENCOUNTER — Encounter (HOSPITAL_COMMUNITY)
Admission: RE | Admit: 2024-01-28 | Discharge: 2024-01-28 | Disposition: A | Discharge status: Home / Self Care | Source: Ambulatory Visit | Attending: Orthopedic Surgery | Admitting: Orthopedic Surgery

## 2024-01-28 ENCOUNTER — Other Ambulatory Visit: Payer: Self-pay

## 2024-01-28 DIAGNOSIS — T8451XA Infection and inflammatory reaction due to internal right hip prosthesis, initial encounter: Secondary | ICD-10-CM | POA: Insufficient documentation

## 2024-01-28 DIAGNOSIS — I471 Supraventricular tachycardia, unspecified: Secondary | ICD-10-CM | POA: Diagnosis not present

## 2024-01-28 DIAGNOSIS — M25551 Pain in right hip: Secondary | ICD-10-CM | POA: Insufficient documentation

## 2024-01-28 DIAGNOSIS — Z01818 Encounter for other preprocedural examination: Secondary | ICD-10-CM

## 2024-01-28 DIAGNOSIS — D509 Iron deficiency anemia, unspecified: Secondary | ICD-10-CM | POA: Insufficient documentation

## 2024-01-28 DIAGNOSIS — Z01812 Encounter for preprocedural laboratory examination: Secondary | ICD-10-CM | POA: Insufficient documentation

## 2024-01-28 DIAGNOSIS — Z9884 Bariatric surgery status: Secondary | ICD-10-CM | POA: Insufficient documentation

## 2024-01-28 LAB — BASIC METABOLIC PANEL WITH GFR
Anion gap: 8 (ref 5–15)
BUN: 15 mg/dL (ref 6–20)
CO2: 23 mmol/L (ref 22–32)
Calcium: 9.2 mg/dL (ref 8.9–10.3)
Chloride: 106 mmol/L (ref 98–111)
Creatinine, Ser: 0.87 mg/dL (ref 0.44–1.00)
GFR, Estimated: >60 mL/min (ref >60)
Glucose, Bld: 131 mg/dL — ABNORMAL HIGH (ref 70–99)
Potassium: 4.3 mmol/L (ref 3.5–5.1)
Sodium: 137 mmol/L (ref 135–145)

## 2024-01-28 LAB — CBC
HCT: 34 % — ABNORMAL LOW (ref 36.0–46.0)
Hemoglobin: 10.3 g/dL — ABNORMAL LOW (ref 12.0–15.0)
MCH: 27.6 pg (ref 26.0–34.0)
MCHC: 30.3 g/dL (ref 30.0–36.0)
MCV: 91.2 fL (ref 80.0–100.0)
Platelets: 311 10*3/uL (ref 150–400)
RBC: 3.73 MIL/uL — ABNORMAL LOW (ref 3.87–5.11)
RDW: 14.1 % (ref 11.5–15.5)
WBC: 5.7 10*3/uL (ref 4.0–10.5)
nRBC: 0 % (ref 0.0–0.2)

## 2024-01-28 LAB — SURGICAL PCR SCREEN
MRSA, PCR: NEGATIVE
Staphylococcus aureus: NEGATIVE

## 2024-01-28 NOTE — Patient Instructions (Signed)
 SURGICAL WAITING ROOM VISITATION  Patients having surgery or a procedure may have no more than 2 support people in the waiting area - these visitors may rotate.    Children under the age of 39 must have an adult with them who is not the patient.  Visitors with respiratory illnesses are discouraged from visiting and should remain at home.  If the patient needs to stay at the hospital during part of their recovery, the visitor guidelines for inpatient rooms apply. Pre-op nurse will coordinate an appropriate time for 1 support person to accompany patient in pre-op.  This support person may not rotate.    Please refer to the [facility] website for the visitor guidelines for Inpatients (after your surgery is over and you are in a regular room).       Your procedure is scheduled on: Tomorrow, January 29, 2024   Report to [facility] Main Entrance    Report to admitting at 11:00 AM   Call this number if you have problems the morning of surgery [phone]   Do not eat food :After Midnight.   After Midnight you may have the following liquids until 10:45 AM/ PM DAY OF SURGERY  Water  Non-Citrus Juices (without pulp, NO RED-Apple, White grape, White cranberry) Black Coffee (NO MILK/CREAM OR CREAMERS, sugar ok)  Clear Tea (NO MILK/CREAM OR CREAMERS, sugar ok) regular and decaf                             Plain Jell-O (NO RED)                                           Fruit ices (not with fruit pulp, NO RED)                                     Popsicles (NO RED)                                                               Sports drinks like Gatorade (NO RED)                   The day of surgery:  Drink ONE (1) Pre-Surgery Clear Ensure at 1045 AM the morning of surgery. Drink in one sitting. Do not sip.  This drink was given to you during your hospital  pre-op appointment visit. Nothing else to drink after completing the  Pre-Surgery Clear Ensure.          If you have  questions, please contact your surgeon's office.   FOLLOW BOWEL PREP AND ANY ADDITIONAL PRE OP INSTRUCTIONS YOU RECEIVED FROM YOUR SURGEON'S OFFICE!!!     Oral Hygiene is also important to reduce your risk of infection.                                    Remember - BRUSH YOUR TEETH THE MORNING OF SURGERY WITH YOUR REGULAR TOOTHPASTE  DENTURES WILL BE REMOVED PRIOR  TO SURGERY PLEASE DO NOT APPLY "Poly grip" OR ADHESIVES!!!   Do NOT smoke after Midnight   Stop all vitamins and herbal supplements 7 days before surgery.   Take these medicines the morning of surgery with A SIP OF WATER :   Tylenol  if needed            Cetirizine            Sertraline             Colace            Rabeprazole            Metoprolol  if needed            Oxycodone  if needed            Use Nasal spray per normal routine            Bring Asthma Inhaler                                         You may not have any metal on your body including hair pins, jewelry, and body piercing             Do not wear make-up, lotions, powders, perfumes/cologne, or deodorant  Do not wear nail polish including gel and S&S, artificial/acrylic nails, or any other type of covering on natural nails including finger and toenails. If you have artificial nails, gel coating, etc. that needs to be removed by a nail salon please have this removed prior to surgery or surgery may need to be canceled/ delayed if the surgeon/ anesthesia feels like they are unable to be safely monitored.   Do not shave  48 hours prior to surgery.    Do not bring valuables to the hospital. [facility] IS NOT             RESPONSIBLE   FOR VALUABLES.   Contacts, glasses, dentures or bridgework may not be worn into surgery.   Bring small overnight bag day of surgery.   DO NOT BRING YOUR HOME MEDICATIONS TO THE HOSPITAL. PHARMACY WILL DISPENSE MEDICATIONS LISTED ON YOUR MEDICATION LIST TO YOU DURING YOUR ADMISSION IN THE HOSPITAL!    Patients discharged on  the day of surgery will not be allowed to drive home.  Someone NEEDS to stay with you for the first 24 hours after anesthesia.   Special Instructions: Bring a copy of your healthcare power of attorney and living will documents the day of surgery if you haven't scanned them before.              Please read over the following fact sheets you were given: IF YOU HAVE QUESTIONS ABOUT YOUR PRE-OP INSTRUCTIONS PLEASE CALL [phone]   If you received a COVID test during your pre-op visit  it is requested that you wear a mask when out in public, stay away from anyone that may not be feeling well and notify your surgeon if you develop symptoms. If you test positive for Covid or have been in contact with anyone that has tested positive in the last 10 days please notify you surgeon.    Pre-operative 5 CHG Bath Instructions   You can play a key role in reducing the risk of infection after surgery. Your skin needs to be as free of germs as possible. You can reduce the number of germs on your  skin by washing with CHG (chlorhexidine  gluconate) soap before surgery. CHG is an antiseptic soap that kills germs and continues to kill germs even after washing.   DO NOT use if you have an allergy to chlorhexidine /CHG or antibacterial soaps. If your skin becomes reddened or irritated, stop using the CHG and notify one of our RNs at [phone].   Please shower with the CHG soap starting 4 days before surgery using the following schedule:     Please keep in mind the following:  DO NOT shave, including legs and underarms, starting the day of your first shower.   You may shave your face at any point before/day of surgery.  Place clean sheets on your bed the day you start using CHG soap. Use a clean washcloth (not used since being washed) for each shower. DO NOT sleep with pets once you start using the CHG.   CHG Shower Instructions:  If you choose to wash your hair and private area, wash first with your normal  shampoo/soap.  After you use shampoo/soap, rinse your hair and body thoroughly to remove shampoo/soap residue.  Turn the water  OFF and apply about 3 tablespoons (45 ml) of CHG soap to a CLEAN washcloth.  Apply CHG soap ONLY FROM YOUR NECK DOWN TO YOUR TOES (washing for 3-5 minutes)  DO NOT use CHG soap on face, private areas, open wounds, or sores.  Pay special attention to the area where your surgery is being performed.  If you are having back surgery, having someone wash your back for you may be helpful. Wait 2 minutes after CHG soap is applied, then you may rinse off the CHG soap.  Pat dry with a clean towel  Put on clean clothes/pajamas   If you choose to wear lotion, please use ONLY the CHG-compatible lotions on the back of this paper.     Additional instructions for the day of surgery: DO NOT APPLY any lotions, deodorants, cologne, or perfumes.   Put on clean/comfortable clothes.  Brush your teeth.  Ask your nurse before applying any prescription medications to the skin.      CHG Compatible Lotions   Aveeno Moisturizing lotion  Cetaphil Moisturizing Cream  Cetaphil Moisturizing Lotion  Clairol Herbal Essence Moisturizing Lotion, Dry Skin  Clairol Herbal Essence Moisturizing Lotion, Extra Dry Skin  Clairol Herbal Essence Moisturizing Lotion, Normal Skin  Curel Age Defying Therapeutic Moisturizing Lotion with Alpha Hydroxy  Curel Extreme Care Body Lotion  Curel Soothing Hands Moisturizing Hand Lotion  Curel Therapeutic Moisturizing Cream, Fragrance-Free  Curel Therapeutic Moisturizing Lotion, Fragrance-Free  Curel Therapeutic Moisturizing Lotion, Original Formula  Eucerin Daily Replenishing Lotion  Eucerin Dry Skin Therapy Plus Alpha Hydroxy Crme  Eucerin Dry Skin Therapy Plus Alpha Hydroxy Lotion  Eucerin Original Crme  Eucerin Original Lotion  Eucerin Plus Crme Eucerin Plus Lotion  Eucerin TriLipid Replenishing Lotion  Keri Anti-Bacterial Hand Lotion  Keri Deep  Conditioning Original Lotion Dry Skin Formula Softly Scented  Keri Deep Conditioning Original Lotion, Fragrance Free Sensitive Skin Formula  Keri Lotion Fast Absorbing Fragrance Free Sensitive Skin Formula  Keri Lotion Fast Absorbing Softly Scented Dry Skin Formula  Keri Original Lotion  Keri Skin Renewal Lotion Keri Silky Smooth Lotion  Keri Silky Smooth Sensitive Skin Lotion  Nivea Body Creamy Conditioning Oil  Nivea Body Extra Enriched Teacher, adult education Moisturizing Lotion Nivea Crme  Nivea Skin Firming Lotion  NutraDerm 30 Skin Lotion  NutraDerm Skin Lotion  NutraDerm Therapeutic  Skin Cream  NutraDerm Therapeutic Skin Lotion  ProShield Protective Hand Cream  Provon moisturizing lotion     WHAT IS A BLOOD TRANSFUSION? Blood Transfusion Information  A transfusion is the replacement of blood or some of its parts. Blood is made up of multiple cells which provide different functions. Red blood cells carry oxygen and are used for blood loss replacement. White blood cells fight against infection. Platelets control bleeding. Plasma helps clot blood. Other blood products are available for specialized needs, such as hemophilia or other clotting disorders. BEFORE THE TRANSFUSION  Who gives blood for transfusions?  Healthy volunteers who are fully evaluated to make sure their blood is safe. This is blood bank blood. Transfusion therapy is the safest it has ever been in the practice of medicine. Before blood is taken from a donor, a complete history is taken to make sure that person has no history of diseases nor engages in risky social behavior (examples are intravenous drug use or sexual activity with multiple partners). The donor's travel history is screened to minimize risk of transmitting infections, such as malaria. The donated blood is tested for signs of infectious diseases, such as HIV and hepatitis. The blood is then tested to be sure it is  compatible with you in order to minimize the chance of a transfusion reaction. If you or a relative donates blood, this is often done in anticipation of surgery and is not appropriate for emergency situations. It takes many days to process the donated blood. RISKS AND COMPLICATIONS Although transfusion therapy is very safe and saves many lives, the main dangers of transfusion include:  Getting an infectious disease. Developing a transfusion reaction. This is an allergic reaction to something in the blood you were given. Every precaution is taken to prevent this. The decision to have a blood transfusion has been considered carefully by your caregiver before blood is given. Blood is not given unless the benefits outweigh the risks. AFTER THE TRANSFUSION Right after receiving a blood transfusion, you will usually feel much better and more energetic. This is especially true if your red blood cells have gotten low (anemic). The transfusion raises the level of the red blood cells which carry oxygen, and this usually causes an energy increase. The nurse administering the transfusion will monitor you carefully for complications. HOME CARE INSTRUCTIONS  No special instructions are needed after a transfusion. You may find your energy is better. Speak with your caregiver about any limitations on activity for underlying diseases you may have. SEEK MEDICAL CARE IF:  Your condition is not improving after your transfusion. You develop redness or irritation at the intravenous (IV) site. SEEK IMMEDIATE MEDICAL CARE IF:  Any of the following symptoms occur over the next 12 hours: Shaking chills. You have a temperature by mouth above 102 F (38.9 C), not controlled by medicine. Chest, back, or muscle pain. People around you feel you are not acting correctly or are confused. Shortness of breath or difficulty breathing. Dizziness and fainting. You get a rash or develop hives. You have a decrease in urine  output. Your urine turns a dark color or changes to pink, red, or brown. Any of the following symptoms occur over the next 10 days: You have a temperature by mouth above 102 F (38.9 C), not controlled by medicine. Shortness of breath. Weakness after normal activity. The white part of the eye turns yellow (jaundice). You have a decrease in the amount of urine or are urinating less often. Your urine turns  a dark color or changes to pink, red, or brown. Document Released: 08/04/2000 Document Revised: 10/30/2011 Document Reviewed: 03/23/2008 [facility] Patient Information 2014 [place], [place].  _______________________________________________________________________

## 2024-01-28 NOTE — Progress Notes (Signed)
 Anesthesia Chart Review   Case: [id number] Date/Time: 01/29/24 1330   Procedure: REVISION, TOTAL ARTHROPLASTY, HIP, ANTERIOR APPROACH (Right: Hip)   Anesthesia type: Spinal   Diagnosis: Hip pain, acute, right [M25.551]   Pre-op diagnosis: INFECTED RIGHT HIP PROSTHESIS   Location: [facility] ROOM 06 / [facility]   Surgeons: [name], MD       DISCUSSION:48 y.o. never smoker with h/o SVT, s/p bariatric surgery, infected right hip prosthesis scheduled for above procedure 01/29/2024 with Dr. Randal Bury.   Pt with h/o SVT, follows with EP.  Last seen 11/20/2023. Per OV note, "Patient has plans for SVT ablation. She also needs a hip replacement. She is able to do her daily activities. She would be at intermediate risk for this intermediate risk procedure. No further cardiac testing is necessary at this time. If she does have episodes of SVT around the time of surgery, will treat with adenosine. "  VS: BP (!) 154/70 (BP Location: Right Arm)   Pulse 80   Temp 36.4 C (Oral)   Resp 16   Ht 5\' 7"  (1.702 m)   Wt 114.2 kg   LMP 04/05/2023   SpO2 99%   BMI 39.44 kg/m   PROVIDERS: Virgle Grime, MD is PCP   Electrophysiologist: Will Cortland Ding, MD  LABS: Labs reviewed: Acceptable for surgery. (all labs ordered are listed, but only abnormal results are displayed)  Labs Reviewed  BASIC METABOLIC PANEL WITH GFR - Abnormal; Notable for the following components:      Result Value   Glucose, Bld 131 (*)    All other components within normal limits  CBC - Abnormal; Notable for the following components:   RBC 3.73 (*)    Hemoglobin 10.3 (*)    HCT 34.0 (*)    All other components within normal limits  SURGICAL PCR SCREEN  TYPE AND SCREEN     IMAGES:   EKG:   CV:  Past Medical History:  Diagnosis Date   Allergic rhinitis 02/27/2008   Qualifier: Diagnosis of   By: Ena Harries MD, Viktoria Gray     IMO SNOMED Dx Update Oct 2024     Anemia    iron  def   Antepartum fetal death     Anxiety    Arthritis    Genital herpes    GERD (gastroesophageal reflux disease)    History of bariatric surgery    History of kidney stones    History of left hip replacement    History of urinary stone    Migraines    Morbid obesity (HCC) 10/09/2016   Pituitary tumor    Polycystic ovary disease    Prolactinoma (HCC)    SVT (supraventricular tachycardia) (HCC)    Vitamin D deficiency     Past Surgical History:  Procedure Laterality Date   CESAREAN SECTION N/A 10/19/2023   Procedure: CESAREAN SECTION;  Surgeon: Julianne Octave, MD;  Location: [facility] ORS;  Service: Obstetrics;  Laterality: N/A;   CYSTOSCOPY/URETEROSCOPY/HOLMIUM LASER/STENT PLACEMENT Right 07/06/2023   Procedure: CYSTOSCOPY/RIGHT RETROGRADE URETEROSCOPY/HOLMIUM LASER/STENT PLACEMENT;  Surgeon: Lahoma Pigg, MD;  Location: [facility];  Service: Urology;  Laterality: Right;   JOINT REPLACEMENT Left 01/13/2021   hip   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)     TUBAL LIGATION     right fallopian tube removed   tube removed      MEDICATIONS:  acetaminophen  (TYLENOL ) 325 MG tablet   albuterol  (PROVENTIL  HFA;VENTOLIN  HFA) 108 (90 BASE) MCG/ACT inhaler  aspirin 81 MG chewable tablet   Cetirizine HCl (ZYRTEC ALLERGY) 10 MG CAPS   Cholecalciferol (VITAMIN D3) 50 MCG (2000 UT) capsule   diclofenac (VOLTAREN) 75 MG EC tablet   docusate sodium  (COLACE) 100 MG capsule   ibuprofen  (ADVIL ) 600 MG tablet   ipratropium (ATROVENT ) 0.03 % nasal spray   liothyronine (CYTOMEL) 5 MCG tablet   methocarbamol  (ROBAXIN ) 750 MG tablet   metoprolol  tartrate (LOPRESSOR ) 25 MG tablet   ondansetron  (ZOFRAN -ODT) 4 MG disintegrating tablet   oxyCODONE  (OXY IR/ROXICODONE ) 5 MG immediate release tablet   pantoprazole  (PROTONIX ) 40 MG tablet   Prenatal Vit-Fe Fumarate-FA (PRENATAL VITAMIN PO)   RABEprazole (ACIPHEX) 20 MG tablet   sertraline  (ZOLOFT ) 100 MG tablet   sulfamethoxazole -trimethoprim  (BACTRIM  DS)  800-160 MG tablet   traZODone (DESYREL) 50 MG tablet   No current facility-administered medications for this encounter.     Chick Cotton Ward, PA-C [facility] Pre-Surgical Testing [phone]

## 2024-01-29 ENCOUNTER — Encounter (HOSPITAL_COMMUNITY): Payer: Self-pay

## 2024-01-29 NOTE — Progress Notes (Signed)
 Spoke with Athena Bland in blood bank to make him aware that patients surgery was changed to 02/05/2024.

## 2024-01-29 NOTE — Progress Notes (Signed)
 Updated date of surgery: 02/05/24  Updated time of arrival: 1230PM  Patient denies any changes in allergies, medications, medical history since pre op appointment on: 01/29/24  Pre op instructions reviewed, follow up questions addressed and patient verbalized understanding at this time.

## 2024-02-05 ENCOUNTER — Ambulatory Visit (HOSPITAL_COMMUNITY): Admitting: Certified Registered Nurse Anesthetist

## 2024-02-05 ENCOUNTER — Encounter (HOSPITAL_COMMUNITY)
Admission: AD | Disposition: A | Discharge status: Home Health | Payer: Self-pay | Source: Home / Self Care | Attending: Orthopedic Surgery

## 2024-02-05 ENCOUNTER — Other Ambulatory Visit: Payer: Self-pay

## 2024-02-05 ENCOUNTER — Inpatient Hospital Stay (HOSPITAL_COMMUNITY)
Admission: AD | Admit: 2024-02-05 | Discharge: 2024-02-09 | DRG: 467 | Disposition: A | Discharge status: Home Health | Attending: Orthopedic Surgery | Admitting: Orthopedic Surgery

## 2024-02-05 ENCOUNTER — Ambulatory Visit (HOSPITAL_COMMUNITY)

## 2024-02-05 ENCOUNTER — Ambulatory Visit (HOSPITAL_COMMUNITY): Payer: Self-pay | Admitting: Physician Assistant

## 2024-02-05 ENCOUNTER — Encounter (HOSPITAL_COMMUNITY): Payer: Self-pay | Admitting: Orthopedic Surgery

## 2024-02-05 DIAGNOSIS — E559 Vitamin D deficiency, unspecified: Secondary | ICD-10-CM | POA: Diagnosis present

## 2024-02-05 DIAGNOSIS — T8450XA Infection and inflammatory reaction due to unspecified internal joint prosthesis, initial encounter: Secondary | ICD-10-CM

## 2024-02-05 DIAGNOSIS — Y831 Surgical operation with implant of artificial internal device as the cause of abnormal reaction of the patient, or of later complication, without mention of misadventure at the time of the procedure: Secondary | ICD-10-CM | POA: Diagnosis present

## 2024-02-05 DIAGNOSIS — Z01818 Encounter for other preprocedural examination: Secondary | ICD-10-CM

## 2024-02-05 DIAGNOSIS — Z87442 Personal history of urinary calculi: Secondary | ICD-10-CM

## 2024-02-05 DIAGNOSIS — L02415 Cutaneous abscess of right lower limb: Secondary | ICD-10-CM | POA: Diagnosis present

## 2024-02-05 DIAGNOSIS — I471 Supraventricular tachycardia, unspecified: Secondary | ICD-10-CM

## 2024-02-05 DIAGNOSIS — E282 Polycystic ovarian syndrome: Secondary | ICD-10-CM | POA: Diagnosis present

## 2024-02-05 DIAGNOSIS — Z888 Allergy status to other drugs, medicaments and biological substances status: Secondary | ICD-10-CM

## 2024-02-05 DIAGNOSIS — F419 Anxiety disorder, unspecified: Secondary | ICD-10-CM | POA: Diagnosis present

## 2024-02-05 DIAGNOSIS — Z96642 Presence of left artificial hip joint: Secondary | ICD-10-CM | POA: Diagnosis present

## 2024-02-05 DIAGNOSIS — T8451XA Infection and inflammatory reaction due to internal right hip prosthesis, initial encounter: Principal | ICD-10-CM | POA: Diagnosis present

## 2024-02-05 DIAGNOSIS — Z8249 Family history of ischemic heart disease and other diseases of the circulatory system: Secondary | ICD-10-CM

## 2024-02-05 DIAGNOSIS — D509 Iron deficiency anemia, unspecified: Principal | ICD-10-CM

## 2024-02-05 DIAGNOSIS — Z96641 Presence of right artificial hip joint: Secondary | ICD-10-CM | POA: Diagnosis present

## 2024-02-05 DIAGNOSIS — Z9884 Bariatric surgery status: Secondary | ICD-10-CM

## 2024-02-05 DIAGNOSIS — Z79899 Other long term (current) drug therapy: Secondary | ICD-10-CM

## 2024-02-05 HISTORY — DX: Migraine, unspecified, not intractable, without status migrainosus: G43.909

## 2024-02-05 HISTORY — DX: Personal history of urinary calculi: Z87.442

## 2024-02-05 HISTORY — PX: ANTERIOR HIP REVISION: SHX6527

## 2024-02-05 HISTORY — DX: Gastro-esophageal reflux disease without esophagitis: K21.9

## 2024-02-05 HISTORY — DX: Unspecified osteoarthritis, unspecified site: M19.90

## 2024-02-05 LAB — POCT PREGNANCY, URINE: Preg Test, Ur: NEGATIVE

## 2024-02-05 LAB — TYPE AND SCREEN
ABO/RH(D): O POS
Antibody Screen: NEGATIVE

## 2024-02-05 SURGERY — REVISION, TOTAL ARTHROPLASTY, HIP, ANTERIOR APPROACH
Anesthesia: Spinal | Site: Hip | Laterality: Right

## 2024-02-05 MED ORDER — LACTATED RINGERS IV SOLN: INTRAVENOUS | Status: DC | PRN

## 2024-02-05 MED ORDER — PROPOFOL 1000 MG/100ML IV EMUL
INTRAVENOUS | Status: AC
  Filled 2024-02-05: qty 100

## 2024-02-05 MED ORDER — METHOCARBAMOL 1000 MG/10ML IJ SOLN: 500.0000 mg | Freq: Four times a day (QID) | INTRAMUSCULAR | Status: DC | PRN

## 2024-02-05 MED ORDER — SERTRALINE HCL 100 MG PO TABS
200.0000 mg | ORAL_TABLET | Freq: Every day | ORAL | Status: DC
  Administered 2024-02-06 – 2024-02-09 (×4): 200 mg via ORAL
  Filled 2024-02-05 (×3): qty 2

## 2024-02-05 MED ORDER — SODIUM CHLORIDE 0.9 % IR SOLN
Status: DC | PRN
  Administered 2024-02-05: 3000 mL

## 2024-02-05 MED ORDER — KETOROLAC TROMETHAMINE 0.5 % OP SOLN: 1.0000 [drp] | Freq: Four times a day (QID) | OPHTHALMIC | Status: DC

## 2024-02-05 MED ORDER — CEFAZOLIN SODIUM-DEXTROSE 2-4 GM/100ML-% IV SOLN
2.0000 g | Freq: Four times a day (QID) | INTRAVENOUS | Status: AC
  Administered 2024-02-05 – 2024-02-06 (×2): 2 g via INTRAVENOUS
  Filled 2024-02-05 (×2): qty 100

## 2024-02-05 MED ORDER — BUPIVACAINE IN DEXTROSE 0.75-8.25 % IT SOLN
INTRATHECAL | Status: DC | PRN
  Administered 2024-02-05: 2 mL via INTRATHECAL

## 2024-02-05 MED ORDER — ACETAMINOPHEN 500 MG PO TABS
1000.0000 mg | ORAL_TABLET | Freq: Once | ORAL | Status: AC
  Administered 2024-02-05: 1000 mg via ORAL
  Filled 2024-02-05: qty 2

## 2024-02-05 MED ORDER — OXYCODONE HCL 5 MG PO TABS: 5.0000 mg | ORAL_TABLET | Freq: Once | ORAL | Status: DC | PRN

## 2024-02-05 MED ORDER — ALUM & MAG HYDROXIDE-SIMETH 200-200-20 MG/5ML PO SUSP: 30.0000 mL | ORAL | Status: DC | PRN

## 2024-02-05 MED ORDER — VANCOMYCIN HCL 1000 MG IV SOLR
INTRAVENOUS | Status: DC | PRN
  Administered 2024-02-05: 1000 mg via TOPICAL

## 2024-02-05 MED ORDER — ACETAMINOPHEN 500 MG PO TABS
1000.0000 mg | ORAL_TABLET | Freq: Four times a day (QID) | ORAL | Status: AC
Start: 2024-02-05 — End: 2024-02-06
  Administered 2024-02-05 – 2024-02-06 (×3): 1000 mg via ORAL
  Filled 2024-02-05 (×3): qty 2

## 2024-02-05 MED ORDER — PROPOFOL 500 MG/50ML IV EMUL
INTRAVENOUS | Status: DC | PRN
  Administered 2024-02-05: 110 ug/kg/min via INTRAVENOUS

## 2024-02-05 MED ORDER — DOCUSATE SODIUM 100 MG PO CAPS
100.0000 mg | ORAL_CAPSULE | Freq: Two times a day (BID) | ORAL | Status: DC
  Administered 2024-02-05 – 2024-02-09 (×8): 100 mg via ORAL
  Filled 2024-02-05 (×8): qty 1

## 2024-02-05 MED ORDER — ONDANSETRON HCL 4 MG/2ML IJ SOLN
INTRAMUSCULAR | Status: AC
  Filled 2024-02-05: qty 2

## 2024-02-05 MED ORDER — LORATADINE 10 MG PO TABS
10.0000 mg | ORAL_TABLET | Freq: Every day | ORAL | Status: DC
  Administered 2024-02-06 – 2024-02-09 (×4): 10 mg via ORAL
  Filled 2024-02-05 (×4): qty 1

## 2024-02-05 MED ORDER — KETOROLAC TROMETHAMINE 30 MG/ML IJ SOLN
INTRAMUSCULAR | Status: AC
  Filled 2024-02-05: qty 1

## 2024-02-05 MED ORDER — ASPIRIN 81 MG PO CHEW
81.0000 mg | CHEWABLE_TABLET | Freq: Two times a day (BID) | ORAL | Status: DC
  Administered 2024-02-05 – 2024-02-09 (×8): 81 mg via ORAL
  Filled 2024-02-05 (×8): qty 1

## 2024-02-05 MED ORDER — BISACODYL 10 MG RE SUPP: 10.0000 mg | Freq: Every day | RECTAL | Status: DC | PRN

## 2024-02-05 MED ORDER — BUPIVACAINE LIPOSOME 1.3 % IJ SUSP: 10.0000 mL | Freq: Once | INTRAMUSCULAR | Status: DC

## 2024-02-05 MED ORDER — ONDANSETRON HCL 4 MG/2ML IJ SOLN: 4.0000 mg | Freq: Four times a day (QID) | INTRAMUSCULAR | Status: DC | PRN

## 2024-02-05 MED ORDER — KETOROLAC TROMETHAMINE 0.5 % OP SOLN
1.0000 [drp] | Freq: Four times a day (QID) | OPHTHALMIC | Status: DC
Start: 2024-02-05 — End: 2024-02-05
  Administered 2024-02-05: 1 [drp] via OPHTHALMIC
  Filled 2024-02-05: qty 5

## 2024-02-05 MED ORDER — ALBUTEROL SULFATE (2.5 MG/3ML) 0.083% IN NEBU: 2.5000 mg | INHALATION_SOLUTION | RESPIRATORY_TRACT | Status: DC | PRN

## 2024-02-05 MED ORDER — PROPOFOL 10 MG/ML IV BOLUS
INTRAVENOUS | Status: AC
  Filled 2024-02-05: qty 20

## 2024-02-05 MED ORDER — CEFAZOLIN SODIUM-DEXTROSE 2-4 GM/100ML-% IV SOLN
2.0000 g | Freq: Once | INTRAVENOUS | Status: AC
  Administered 2024-02-05: 2 g via INTRAVENOUS
  Filled 2024-02-05: qty 100

## 2024-02-05 MED ORDER — POLYETHYLENE GLYCOL 3350 17 G PO PACK: 17.0000 g | PACK | Freq: Every day | ORAL | Status: DC | PRN

## 2024-02-05 MED ORDER — HYDROMORPHONE HCL 2 MG PO TABS
2.0000 mg | ORAL_TABLET | ORAL | Status: DC | PRN
  Administered 2024-02-07 – 2024-02-08 (×2): 2 mg via ORAL
  Administered 2024-02-08: 3 mg via ORAL
  Administered 2024-02-08 – 2024-02-09 (×2): 2 mg via ORAL
  Administered 2024-02-09: 3 mg via ORAL
  Filled 2024-02-05 (×2): qty 1
  Filled 2024-02-05 (×2): qty 2
  Filled 2024-02-05 (×2): qty 1

## 2024-02-05 MED ORDER — PHENYLEPHRINE HCL-NACL 20-0.9 MG/250ML-% IV SOLN
INTRAVENOUS | Status: DC | PRN
Start: 2024-02-05 — End: 2024-02-05
  Administered 2024-02-05: 50 ug/min via INTRAVENOUS

## 2024-02-05 MED ORDER — PHENOL 1.4 % MT LIQD: 1.0000 | OROMUCOSAL | Status: DC | PRN

## 2024-02-05 MED ORDER — PHENYLEPHRINE HCL (PRESSORS) 10 MG/ML IV SOLN
INTRAVENOUS | Status: DC | PRN
  Administered 2024-02-05: 80 ug via INTRAVENOUS

## 2024-02-05 MED ORDER — METHOCARBAMOL 500 MG PO TABS
500.0000 mg | ORAL_TABLET | Freq: Four times a day (QID) | ORAL | Status: DC | PRN
Start: 2024-02-05 — End: 2024-02-09
  Administered 2024-02-05 – 2024-02-09 (×5): 500 mg via ORAL
  Filled 2024-02-05 (×6): qty 1

## 2024-02-05 MED ORDER — DEXAMETHASONE SODIUM PHOSPHATE 10 MG/ML IJ SOLN
INTRAMUSCULAR | Status: DC | PRN
  Administered 2024-02-05: 8 mg via INTRAVENOUS

## 2024-02-05 MED ORDER — METOPROLOL TARTRATE 12.5 MG HALF TABLET: 12.5000 mg | ORAL_TABLET | Freq: Two times a day (BID) | ORAL | Status: DC | PRN

## 2024-02-05 MED ORDER — AMOXICILLIN 500 MG PO CAPS
500.0000 mg | ORAL_CAPSULE | Freq: Three times a day (TID) | ORAL | Status: DC
  Administered 2024-02-05 – 2024-02-06 (×2): 500 mg via ORAL
  Filled 2024-02-05 (×3): qty 1

## 2024-02-05 MED ORDER — VANCOMYCIN HCL 1000 MG IV SOLR
INTRAVENOUS | Status: AC
  Filled 2024-02-05: qty 20

## 2024-02-05 MED ORDER — ONDANSETRON HCL 4 MG PO TABS: 4.0000 mg | ORAL_TABLET | Freq: Four times a day (QID) | ORAL | Status: DC | PRN

## 2024-02-05 MED ORDER — FENTANYL CITRATE PF 50 MCG/ML IJ SOSY: 25.0000 ug | PREFILLED_SYRINGE | INTRAMUSCULAR | Status: DC | PRN

## 2024-02-05 MED ORDER — MIDAZOLAM HCL 2 MG/2ML IJ SOLN
INTRAMUSCULAR | Status: DC | PRN
  Administered 2024-02-05: 2 mg via INTRAVENOUS

## 2024-02-05 MED ORDER — TRANEXAMIC ACID-NACL 1000-0.7 MG/100ML-% IV SOLN
1000.0000 mg | INTRAVENOUS | Status: AC
  Administered 2024-02-05: 100 mg via INTRAVENOUS
  Filled 2024-02-05: qty 100

## 2024-02-05 MED ORDER — TRAZODONE HCL 100 MG PO TABS
100.0000 mg | ORAL_TABLET | Freq: Every evening | ORAL | Status: DC | PRN
  Administered 2024-02-05 – 2024-02-07 (×3): 100 mg via ORAL
  Filled 2024-02-05 (×3): qty 1

## 2024-02-05 MED ORDER — LIOTHYRONINE SODIUM 5 MCG PO TABS
5.0000 ug | ORAL_TABLET | Freq: Every day | ORAL | Status: DC
  Administered 2024-02-06 – 2024-02-09 (×4): 5 ug via ORAL
  Filled 2024-02-05 (×4): qty 1

## 2024-02-05 MED ORDER — ALBUTEROL SULFATE HFA 108 (90 BASE) MCG/ACT IN AERS: 2.0000 | INHALATION_SPRAY | RESPIRATORY_TRACT | Status: DC | PRN

## 2024-02-05 MED ORDER — ONDANSETRON HCL 4 MG/2ML IJ SOLN
INTRAMUSCULAR | Status: DC | PRN
  Administered 2024-02-05: 4 mg via INTRAVENOUS

## 2024-02-05 MED ORDER — POLYVINYL ALCOHOL 1.4 % OP SOLN
1.0000 [drp] | OPHTHALMIC | Status: DC | PRN
  Administered 2024-02-05: 1 [drp] via OPHTHALMIC
  Filled 2024-02-05: qty 15

## 2024-02-05 MED ORDER — POVIDONE-IODINE 10 % EX SWAB: 2.0000 | Freq: Once | CUTANEOUS | Status: DC

## 2024-02-05 MED ORDER — MAGNESIUM CITRATE PO SOLN: 1.0000 | Freq: Once | ORAL | Status: DC | PRN

## 2024-02-05 MED ORDER — OXYCODONE HCL 5 MG/5ML PO SOLN: 5.0000 mg | Freq: Once | ORAL | Status: DC | PRN

## 2024-02-05 MED ORDER — TRANEXAMIC ACID-NACL 1000-0.7 MG/100ML-% IV SOLN
1000.0000 mg | Freq: Once | INTRAVENOUS | Status: AC
  Administered 2024-02-05: 1000 mg via INTRAVENOUS
  Filled 2024-02-05: qty 100

## 2024-02-05 MED ORDER — HYDROMORPHONE HCL 1 MG/ML IJ SOLN
0.5000 mg | INTRAMUSCULAR | Status: DC | PRN
  Administered 2024-02-06 – 2024-02-08 (×4): 1 mg via INTRAVENOUS
  Filled 2024-02-05 (×4): qty 1

## 2024-02-05 MED ORDER — POLYMYXIN B-TRIMETHOPRIM 10000-0.1 UNIT/ML-% OP SOLN
1.0000 [drp] | Freq: Four times a day (QID) | OPHTHALMIC | Status: DC
Start: 2024-02-05 — End: 2024-02-05
  Administered 2024-02-05: 1 [drp] via OPHTHALMIC
  Filled 2024-02-05: qty 10

## 2024-02-05 MED ORDER — DEXAMETHASONE SODIUM PHOSPHATE 10 MG/ML IJ SOLN: 8.0000 mg | Freq: Once | INTRAMUSCULAR | Status: DC

## 2024-02-05 MED ORDER — AMISULPRIDE (ANTIEMETIC) 5 MG/2ML IV SOLN: 10.0000 mg | Freq: Once | INTRAVENOUS | Status: DC | PRN

## 2024-02-05 MED ORDER — DEXAMETHASONE SODIUM PHOSPHATE 10 MG/ML IJ SOLN
INTRAMUSCULAR | Status: AC
  Filled 2024-02-05: qty 1

## 2024-02-05 MED ORDER — DEXAMETHASONE SODIUM PHOSPHATE 10 MG/ML IJ SOLN
10.0000 mg | Freq: Once | INTRAMUSCULAR | Status: AC
  Administered 2024-02-06: 10 mg via INTRAVENOUS
  Filled 2024-02-05: qty 1

## 2024-02-05 MED ORDER — OXYCODONE HCL 5 MG PO TABS
5.0000 mg | ORAL_TABLET | ORAL | Status: DC | PRN
  Administered 2024-02-05: 5 mg via ORAL
  Administered 2024-02-06: 10 mg via ORAL
  Administered 2024-02-06: 5 mg via ORAL
  Administered 2024-02-07: 10 mg via ORAL
  Filled 2024-02-05 (×2): qty 2
  Filled 2024-02-05 (×2): qty 1
  Filled 2024-02-05: qty 2

## 2024-02-05 MED ORDER — CHLORHEXIDINE GLUCONATE 0.12 % MT SOLN
15.0000 mL | Freq: Once | OROMUCOSAL | Status: AC
  Administered 2024-02-05: 15 mL via OROMUCOSAL

## 2024-02-05 MED ORDER — DIPHENHYDRAMINE HCL 12.5 MG/5ML PO ELIX
12.5000 mg | ORAL_SOLUTION | ORAL | Status: DC | PRN
  Administered 2024-02-06: 25 mg via ORAL
  Filled 2024-02-05: qty 10

## 2024-02-05 MED ORDER — MIDAZOLAM HCL 2 MG/2ML IJ SOLN
INTRAMUSCULAR | Status: AC
  Filled 2024-02-05: qty 2

## 2024-02-05 MED ORDER — LACTATED RINGERS IV SOLN: INTRAVENOUS | Status: DC

## 2024-02-05 MED ORDER — POVIDONE-IODINE 10 % EX SWAB
2.0000 | Freq: Once | CUTANEOUS | Status: AC
  Administered 2024-02-05: 2 via TOPICAL

## 2024-02-05 MED ORDER — WATER FOR IRRIGATION, STERILE IR SOLN
Status: DC | PRN
  Administered 2024-02-05: 1000 mL

## 2024-02-05 MED ORDER — KETOROLAC TROMETHAMINE 30 MG/ML IJ SOLN
INTRAMUSCULAR | Status: DC | PRN
  Administered 2024-02-05: 30 mg via INTRAVENOUS

## 2024-02-05 MED ORDER — ORAL CARE MOUTH RINSE: 15.0000 mL | Freq: Once | OROMUCOSAL | Status: AC

## 2024-02-05 MED ORDER — IPRATROPIUM BROMIDE 0.03 % NA SOLN
1.0000 | Freq: Two times a day (BID) | NASAL | Status: DC
  Administered 2024-02-05 – 2024-02-09 (×8): 1 via NASAL
  Filled 2024-02-05: qty 30

## 2024-02-05 MED ORDER — MENTHOL 3 MG MT LOZG: 1.0000 | LOZENGE | OROMUCOSAL | Status: DC | PRN

## 2024-02-05 MED ORDER — PANTOPRAZOLE SODIUM 40 MG PO TBEC
40.0000 mg | DELAYED_RELEASE_TABLET | Freq: Every day | ORAL | Status: DC
  Administered 2024-02-06 – 2024-02-09 (×4): 40 mg via ORAL
  Filled 2024-02-05 (×4): qty 1

## 2024-02-05 MED ORDER — ACETAMINOPHEN 325 MG PO TABS
325.0000 mg | ORAL_TABLET | Freq: Four times a day (QID) | ORAL | Status: DC | PRN
  Administered 2024-02-07: 325 mg via ORAL
  Administered 2024-02-08: 650 mg via ORAL
  Filled 2024-02-05: qty 2
  Filled 2024-02-05: qty 1

## 2024-02-05 MED ORDER — 0.9 % SODIUM CHLORIDE (POUR BTL) OPTIME
TOPICAL | Status: DC | PRN
  Administered 2024-02-05: 1000 mL

## 2024-02-05 MED ORDER — METOCLOPRAMIDE HCL 5 MG/ML IJ SOLN: 5.0000 mg | Freq: Three times a day (TID) | INTRAMUSCULAR | Status: DC | PRN

## 2024-02-05 MED ORDER — OXYCODONE HCL ER 10 MG PO T12A
10.0000 mg | EXTENDED_RELEASE_TABLET | Freq: Two times a day (BID) | ORAL | Status: DC
  Administered 2024-02-05 – 2024-02-09 (×8): 10 mg via ORAL
  Filled 2024-02-05 (×8): qty 1

## 2024-02-05 MED ORDER — METOCLOPRAMIDE HCL 5 MG PO TABS: 5.0000 mg | ORAL_TABLET | Freq: Three times a day (TID) | ORAL | Status: DC | PRN

## 2024-02-05 SURGICAL SUPPLY — 43 items
BAG COUNTER SPONGE SURGICOUNT (BAG) IMPLANT
BAG ZIPLOCK 12X15 (MISCELLANEOUS) IMPLANT
BLADE SAG 18X100X1.27 (BLADE) ×1 IMPLANT
CANISTER WOUNDNEG PRESSURE 500 (CANNISTER) IMPLANT
CHLORAPREP W/TINT 26 (MISCELLANEOUS) ×1 IMPLANT
CLSR STERI-STRIP ANTIMIC 1/2X4 (GAUZE/BANDAGES/DRESSINGS) ×1 IMPLANT
COVER PERINEAL POST (MISCELLANEOUS) ×1 IMPLANT
COVER SURGICAL LIGHT HANDLE (MISCELLANEOUS) ×1 IMPLANT
DRAPE IMP U-DRAPE 54X76 (DRAPES) ×1 IMPLANT
DRAPE STERI IOBAN 125X83 (DRAPES) ×1 IMPLANT
DRAPE U-SHAPE 47X51 STRL (DRAPES) ×2 IMPLANT
DRESSING PEEL AND PLC PRVNA 13 (GAUZE/BANDAGES/DRESSINGS) IMPLANT
DRSG MEPILEX POST OP 4X8 (GAUZE/BANDAGES/DRESSINGS) ×1 IMPLANT
ELECT PENCIL ROCKER SW 15FT (MISCELLANEOUS) ×1 IMPLANT
ELECT REM PT RETURN 15FT ADLT (MISCELLANEOUS) ×1 IMPLANT
GLOVE BIO SURGEON STRL SZ7.5 (GLOVE) ×1 IMPLANT
GLOVE BIOGEL PI IND STRL 7.5 (GLOVE) ×1 IMPLANT
GLOVE BIOGEL PI IND STRL 8 (GLOVE) ×1 IMPLANT
GLOVE SURG SYN 7.0 (GLOVE) ×1 IMPLANT
GLOVE SURG SYN 7.0 PF PI (GLOVE) ×1 IMPLANT
GOWN STRL REUS W/ TWL LRG LVL3 (GOWN DISPOSABLE) ×1 IMPLANT
GOWN STRL REUS W/ TWL XL LVL3 (GOWN DISPOSABLE) ×1 IMPLANT
HEAD BIOLOX HIP 36/-5 (Joint) IMPLANT
HOLDER FOLEY CATH W/STRAP (MISCELLANEOUS) IMPLANT
INSERT TRIDENT POLY 36 0DEG (Insert) IMPLANT
KIT DRSG PREVENA PLUS 7DAY 125 (MISCELLANEOUS) IMPLANT
KIT TURNOVER KIT A (KITS) ×2 IMPLANT
LAVAGE JET IRRISEPT WOUND (IRRIGATION / IRRIGATOR) IMPLANT
MANIFOLD NEPTUNE II (INSTRUMENTS) ×1 IMPLANT
NS IRRIG 1000ML POUR BTL (IV SOLUTION) ×1 IMPLANT
PACK ANTERIOR HIP CUSTOM (KITS) ×1 IMPLANT
PROTECTOR NERVE ULNAR (MISCELLANEOUS) ×1 IMPLANT
SPIKE FLUID TRANSFER (MISCELLANEOUS) ×2 IMPLANT
SUT ETHILON 3 0 PS 1 (SUTURE) IMPLANT
SUT MNCRL AB 3-0 PS2 18 (SUTURE) ×1 IMPLANT
SUT STRATAFIX PDS+ 0 24IN (SUTURE) IMPLANT
SUT VIC AB 0 CT1 36 (SUTURE) ×1 IMPLANT
SUT VIC AB 1 CT1 36 (SUTURE) ×1 IMPLANT
SUT VIC AB 2-0 CT1 TAPERPNT 27 (SUTURE) ×1 IMPLANT
SUTURE STRATFX 0 PDS 27 VIOLET (SUTURE) IMPLANT
TRAY FOLEY MTR SLVR 16FR STAT (SET/KITS/TRAYS/PACK) ×1 IMPLANT
TUBE SUCTION HIGH CAP CLEAR NV (SUCTIONS) ×1 IMPLANT
WATER STERILE IRR 1000ML POUR (IV SOLUTION) ×2 IMPLANT

## 2024-02-05 NOTE — Anesthesia Procedure Notes (Signed)
 Spinal  Patient location during procedure: OR Start time: 02/05/2024 3:05 PM End time: 02/05/2024 3:10 PM Reason for block: surgical anesthesia Staffing Performed: anesthesiologist  Anesthesiologist: Peggi Bowels, MD Performed by: Peggi Bowels, MD Authorized by: Peggi Bowels, MD   Preanesthetic Checklist Completed: patient identified, IV checked, risks and benefits discussed, surgical consent, monitors and equipment checked, pre-op evaluation and timeout performed Spinal Block Patient position: sitting Prep: DuraPrep Patient monitoring: cardiac monitor, continuous pulse ox and blood pressure Approach: midline Location: L3-4 Injection technique: single-shot Needle Needle type: Pencan  Needle gauge: 24 G Needle length: 9 cm Assessment Sensory level: T10 Events: CSF return Additional Notes Functioning IV was confirmed and monitors were applied. Sterile prep and drape, including hand hygiene and sterile gloves were used. The patient was positioned and the spine was prepped. The skin was anesthetized with lidocaine .  Free flow of clear CSF was obtained prior to injecting local anesthetic into the CSF.  The spinal needle aspirated freely following injection.  The needle was carefully withdrawn.  The patient tolerated the procedure well.

## 2024-02-05 NOTE — Discharge Instructions (Addendum)

## 2024-02-05 NOTE — Interval H&P Note (Signed)
 History and Physical Interval Note:  02/05/2024 12:05 PM  Sarah Saunders  has presented today for surgery, with the diagnosis of INFECTED RIGHT HIP PROSTHESIS.  The various methods of treatment have been discussed with the patient and family. After consideration of risks, benefits and other options for treatment, the patient has consented to  Procedure(s): REVISION, TOTAL ARTHROPLASTY, HIP, ANTERIOR APPROACH (Right) as a surgical intervention.  The patient's history has been reviewed, patient examined, no change in status, stable for surgery.  I have reviewed the patient's chart and labs.  Questions were answered to the patient's satisfaction.     [name]

## 2024-02-05 NOTE — Anesthesia Postprocedure Evaluation (Signed)
 Anesthesia Post Note  Patient: Sarah Saunders  Procedure(s) Performed: REVISION, TOTAL ARTHROPLASTY, HIP, ANTERIOR APPROACH (Right: Hip)     Patient location during evaluation: PACU Anesthesia Type: Spinal Level of consciousness: awake and alert Pain management: pain level controlled Vital Signs Assessment: post-procedure vital signs reviewed and stable Respiratory status: spontaneous breathing, nonlabored ventilation and respiratory function stable Cardiovascular status: blood pressure returned to baseline Postop Assessment: no apparent nausea or vomiting, spinal receding, no backache and no headache Anesthetic complications: yes Comments: Patient seen in PACU with c/o bilateral eye pain/feels like sand in my eyes following surgery. Conjunctiva injected bilaterally. No vision changes. Eyes irrigated with saline without improvement. Suspect corneal abrasions. Antibiotic and ketorolac  drops ordered. Jarrell Merritts, MD    Encounter Notable Events  Notable Event Outcome Phase Comment  Corneal abrasion  In Recovery     Last Vitals:  Vitals:   02/05/24 1800 02/05/24 1815  BP: 109/73 102/68  Pulse: 82 85  Resp: 16 18  Temp:    SpO2: 95% 97%    Last Pain:  Vitals:   02/05/24 1815  TempSrc:   PainSc: 0-No pain                 Rayfield Cairo

## 2024-02-05 NOTE — Progress Notes (Signed)
     In office right hip joint ultrasound guided aspiration 01/25/24 results   Synovasure alpha defensin       POSITIVE Synovial fluid CRP                    15.1 Neutrophils                                83.6% RBCs                                         60,000 WBCs                                        1112  P. Acnes                                    Negative Staph panel                               Negative Candida panel                           Negative Enterococcus panel                   POSITIVE     Ander Bame Office [phone] 02/05/2024, 1:37 PM

## 2024-02-05 NOTE — Anesthesia Preprocedure Evaluation (Addendum)
 Anesthesia Evaluation  Patient identified by MRN, date of birth, ID band Patient awake    Reviewed: Allergy & Precautions, NPO status , Patient's Chart, lab work & pertinent test results  Airway Mallampati: II  TM Distance: >3 FB Neck ROM: Full    Dental no notable dental hx.    Pulmonary neg pulmonary ROS   Pulmonary exam normal        Cardiovascular Normal cardiovascular exam+ dysrhythmias Supra Ventricular Tachycardia      Neuro/Psych  Headaches  Anxiety        GI/Hepatic negative GI ROS,,,(+)     substance abuse    Endo/Other  negative endocrine ROS    Renal/GU negative Renal ROS     Musculoskeletal  (+) Arthritis ,  narcotic dependentPrimary surgery May 29th 2025   Abdominal  (+) + obese  Peds  Hematology  (+) Blood dyscrasia, anemia PLT: 311   Anesthesia Other Findings INFECTED RIGHT HIP PROSTHESIS  Reproductive/Obstetrics Hcg negative                              Anesthesia Physical Anesthesia Plan  ASA: 3  Anesthesia Plan: Spinal   Post-op Pain Management:    Induction:   PONV Risk Score and Plan: 2 and Ondansetron , Dexamethasone , Propofol  infusion, Midazolam and Treatment may vary due to age or medical condition  Airway Management Planned: Simple Face Mask  Additional Equipment:   Intra-op Plan:   Post-operative Plan:   Informed Consent: I have reviewed the patients History and Physical, chart, labs and discussed the procedure including the risks, benefits and alternatives for the proposed anesthesia with the patient or authorized representative who has indicated his/her understanding and acceptance.     Dental advisory given  Plan Discussed with: CRNA  Anesthesia Plan Comments:        Anesthesia Quick Evaluation

## 2024-02-05 NOTE — Interval H&P Note (Signed)
 History and Physical Interval Note:  02/05/2024 1:26 PM  Sarah Saunders  has presented today for surgery, with the diagnosis of INFECTED RIGHT HIP PROSTHESIS.  The various methods of treatment have been discussed with the patient and family. After consideration of risks, benefits and other options for treatment, the patient has consented to  Procedure(s): REVISION, TOTAL ARTHROPLASTY, HIP, ANTERIOR APPROACH (Right) as a surgical intervention.  The patient's history has been reviewed, patient examined, no change in status, stable for surgery.  I have reviewed the patient's chart and labs.  Questions were answered to the patient's satisfaction.     [name]

## 2024-02-05 NOTE — Transfer of Care (Signed)
 Immediate Anesthesia Transfer of Care Note  Patient: Sarah Saunders  Procedure(s) Performed: Procedure(s): REVISION, TOTAL ARTHROPLASTY, HIP, ANTERIOR APPROACH (Right)  Patient Location: PACU  Anesthesia Type:Spinal  Level of Consciousness:  sedated, patient cooperative and responds to stimulation  Airway & Oxygen Therapy:Patient Spontanous Breathing and Patient connected to face mask oxgen  Post-op Assessment:  Report given to PACU RN and Post -op Vital signs reviewed and stable  Post vital signs:  Reviewed and stable  Last Vitals:  Vitals:   02/05/24 1210  BP: (!) 142/92  Pulse: 92  Resp: 16  Temp: 36.6 C  SpO2: 99%    Complications: No apparent anesthesia complications

## 2024-02-05 NOTE — Anesthesia Procedure Notes (Signed)
 Procedure Name: MAC Date/Time: 02/05/2024 3:48 PM  Performed by: Darlena Ego, CRNAPre-anesthesia Checklist: Patient identified, Emergency Drugs available, Suction available, Patient being monitored and Timeout performed Patient Re-evaluated:Patient Re-evaluated prior to induction Oxygen Delivery Method: Simple face mask Preoxygenation: Pre-oxygenation with 100% oxygen Induction Type: IV induction Airway Equipment and Method: Oral airway

## 2024-02-06 ENCOUNTER — Encounter (HOSPITAL_COMMUNITY): Payer: Self-pay | Admitting: Orthopedic Surgery

## 2024-02-06 DIAGNOSIS — F419 Anxiety disorder, unspecified: Secondary | ICD-10-CM | POA: Diagnosis present

## 2024-02-06 DIAGNOSIS — Z9884 Bariatric surgery status: Secondary | ICD-10-CM | POA: Diagnosis not present

## 2024-02-06 DIAGNOSIS — T8451XA Infection and inflammatory reaction due to internal right hip prosthesis, initial encounter: Secondary | ICD-10-CM | POA: Diagnosis present

## 2024-02-06 DIAGNOSIS — Z888 Allergy status to other drugs, medicaments and biological substances status: Secondary | ICD-10-CM | POA: Diagnosis not present

## 2024-02-06 DIAGNOSIS — L02415 Cutaneous abscess of right lower limb: Secondary | ICD-10-CM | POA: Diagnosis present

## 2024-02-06 DIAGNOSIS — B952 Enterococcus as the cause of diseases classified elsewhere: Secondary | ICD-10-CM

## 2024-02-06 DIAGNOSIS — Z79899 Other long term (current) drug therapy: Secondary | ICD-10-CM | POA: Diagnosis not present

## 2024-02-06 DIAGNOSIS — Z96642 Presence of left artificial hip joint: Secondary | ICD-10-CM | POA: Diagnosis present

## 2024-02-06 DIAGNOSIS — E559 Vitamin D deficiency, unspecified: Secondary | ICD-10-CM | POA: Diagnosis present

## 2024-02-06 DIAGNOSIS — E282 Polycystic ovarian syndrome: Secondary | ICD-10-CM | POA: Diagnosis present

## 2024-02-06 DIAGNOSIS — M25551 Pain in right hip: Secondary | ICD-10-CM | POA: Diagnosis present

## 2024-02-06 DIAGNOSIS — Y831 Surgical operation with implant of artificial internal device as the cause of abnormal reaction of the patient, or of later complication, without mention of misadventure at the time of the procedure: Secondary | ICD-10-CM | POA: Diagnosis present

## 2024-02-06 DIAGNOSIS — Z8249 Family history of ischemic heart disease and other diseases of the circulatory system: Secondary | ICD-10-CM | POA: Diagnosis not present

## 2024-02-06 DIAGNOSIS — Z96641 Presence of right artificial hip joint: Secondary | ICD-10-CM

## 2024-02-06 DIAGNOSIS — Z87442 Personal history of urinary calculi: Secondary | ICD-10-CM | POA: Diagnosis not present

## 2024-02-06 LAB — CBC
HCT: 31.8 % — ABNORMAL LOW (ref 36.0–46.0)
Hemoglobin: 9.7 g/dL — ABNORMAL LOW (ref 12.0–15.0)
MCH: 27.8 pg (ref 26.0–34.0)
MCHC: 30.5 g/dL (ref 30.0–36.0)
MCV: 91.1 fL (ref 80.0–100.0)
Platelets: 300 10*3/uL (ref 150–400)
RBC: 3.49 MIL/uL — ABNORMAL LOW (ref 3.87–5.11)
RDW: 14.4 % (ref 11.5–15.5)
WBC: 7.4 10*3/uL (ref 4.0–10.5)
nRBC: 0 % (ref 0.0–0.2)

## 2024-02-06 LAB — BASIC METABOLIC PANEL WITH GFR
Anion gap: 4 — ABNORMAL LOW (ref 5–15)
BUN: 19 mg/dL (ref 6–20)
CO2: 24 mmol/L (ref 22–32)
Calcium: 8.7 mg/dL — ABNORMAL LOW (ref 8.9–10.3)
Chloride: 108 mmol/L (ref 98–111)
Creatinine, Ser: 0.66 mg/dL (ref 0.44–1.00)
GFR, Estimated: >60 mL/min (ref >60)
Glucose, Bld: 117 mg/dL — ABNORMAL HIGH (ref 70–99)
Potassium: 4.8 mmol/L (ref 3.5–5.1)
Sodium: 136 mmol/L (ref 135–145)

## 2024-02-06 MED ORDER — SODIUM CHLORIDE 0.9 % IV SOLN
3.0000 g | Freq: Four times a day (QID) | INTRAVENOUS | Status: DC
  Administered 2024-02-06 – 2024-02-08 (×8): 3 g via INTRAVENOUS
  Filled 2024-02-06 (×9): qty 8

## 2024-02-06 MED ORDER — SODIUM CHLORIDE 0.9 % IV SOLN
10.0000 mg/kg | Freq: Every day | INTRAVENOUS | Status: DC
  Administered 2024-02-06 – 2024-02-09 (×4): 800 mg via INTRAVENOUS
  Filled 2024-02-06 (×4): qty 16

## 2024-02-06 NOTE — Progress Notes (Signed)
    Subjective: Patient reports pain as {DEGREE - MILD, MOD, SEV:20224}.  Tolerating diet.  Urinating.   No CP, SOB.  Has not mobilized OOB with PT yet. Wound vac in place and functioning.   Objective:   VITALS:   Vitals:   02/05/24 2113 02/06/24 0151 02/06/24 0627 02/06/24 0911  BP: 125/67 (!) 96/58 101/72 112/76  Pulse: 90 81 80 76  Resp: 16 16 17 17   Temp: 97.7 F (36.5 C) 97.9 F (36.6 C) 97.7 F (36.5 C) 97.6 F (36.4 C)  TempSrc: Oral Oral  Oral  SpO2: 96% 97% 100% 100%  Weight:      Height:          Latest Ref Rng & Units 02/06/2024    3:38 AM 01/28/2024   11:38 AM 11/13/2023    9:30 AM  CBC  WBC 4.0 - 10.5 K/uL 7.4  5.7  4.2   Hemoglobin 12.0 - 15.0 g/dL 9.7  16.1  09.6   Hematocrit 36.0 - 46.0 % 31.8  34.0  34.5   Platelets 150 - 400 K/uL 300  311  215       Latest Ref Rng & Units 02/06/2024    3:38 AM 01/28/2024   11:38 AM 11/13/2023    9:30 AM  BMP  Glucose 70 - 99 mg/dL 045  409  79   BUN 6 - 20 mg/dL 19  15  16    Creatinine 0.44 - 1.00 mg/dL 8.11  9.14  7.82   Sodium 135 - 145 mmol/L 136  137  142   Potassium 3.5 - 5.1 mmol/L 4.8  4.3  3.5   Chloride 98 - 111 mmol/L 108  106  112   CO2 22 - 32 mmol/L 24  23  26    Calcium  8.9 - 10.3 mg/dL 8.7  9.2  8.7    Intake/Output      06/17 0701 06/18 0700 06/18 0701 06/19 0700   P.O. 360 240   I.V. (mL/kg) 1000 (8.8)    IV Piggyback 300 71.5   Total Intake(mL/kg) 1660 (14.5) 311.5 (2.7)   Urine (mL/kg/hr) 1450 300 (0.5)   Drains 0    Blood 300    Total Output 1750 300   Net -90 +11.5           Physical Exam: General: NAD.  *** Resp: No increased wob Cardio: regular rate and rhythm ABD soft Neurologically intact MSK Neurovascularly intact Sensation intact distally Intact pulses distally Dorsiflexion/Plantar flexion intact Incision: dressing C/D/I Wound vac on with *** cc of *** fluid in the canister  Assessment: 1 Day Post-Op  S/P Procedure(s) (LRB): REVISION, TOTAL ARTHROPLASTY, HIP,  ANTERIOR APPROACH (Right) by Dr. Deloras Fess. Murphy on 02/05/24  Principal Problem:   History of revision of total replacement of right hip joint   Plan: Have consulted ID   Advance diet Up with therapy Incentive Spirometry Elevate and Apply ice  Weightbearing: WBAT RLE Insicional and dressing care: Dressings left intact until follow-up and Reinforce dressings as needed Orthopedic device(s): Wound Vac:*** Showering: Keep dressing dry VTE prophylaxis: Aspirin 81mg  BID x 30 days postop, SCDs, ambulation Pain control: PRN  Follow - up plan: 1 week Contact information:  Randal Bury MD, Marzella Solan PA-C  Dispo: TBD pending ID consult, pain control, and PT evaluation.***     Lore Rode, PA-C Office [phone] 02/06/2024, 12:22 PM

## 2024-02-06 NOTE — Evaluation (Addendum)
 Physical Therapy Evaluation Patient Details Name: Sarah Saunders MRN: [id number] DOB: 1976-07-12 Today's Date: 02/06/2024  History of Present Illness  48 yo female s/p R THA revision 02/05/24. Hx of anemia, L THA, obesity, anxiety, SVT  Clinical Impression  On eval, pt required Min A for mobility. She walked ~85 feet with a RW. Moderate pain with activity. Tolerated session well. Will plan to follow and progress activity as tolerated.         If plan is discharge home, recommend the following: A little help with walking and/or transfers;A little help with bathing/dressing/bathroom;Assistance with cooking/housework;Assist for transportation;Help with stairs or ramp for entrance   Can travel by private vehicle        Equipment Recommendations None recommended by PT  Recommendations for Other Services       Functional Status Assessment Patient has had a recent decline in their functional status and demonstrates the ability to make significant improvements in function in a reasonable and predictable amount of time.     Precautions / Restrictions Precautions Precautions: Fall Restrictions Weight Bearing Restrictions Per Provider Order: Yes RLE Weight Bearing Per Provider Order: Weight bearing as tolerated      Mobility  Bed Mobility Overal bed mobility: Needs Assistance Bed Mobility: Supine to Sit     Supine to sit: Contact guard, HOB elevated, Used rails     General bed mobility comments: Pt used gait belt as leg lifter. Relied on bedrail as well. Increased time. Cues provided.    Transfers Overall transfer level: Needs assistance Equipment used: Rolling walker (2 wheels) Transfers: Sit to/from Stand Sit to Stand: Min assist, From elevated surface           General transfer comment: Cues for safety, technqiue, hand placement. Increased time.    Ambulation/Gait Ambulation/Gait assistance: Min assist Gait Distance (Feet): 85 Feet Assistive device: Rolling  walker (2 wheels) Gait Pattern/deviations: Step-to pattern       General Gait Details: Cues for safety. Slow gait speed. Tolerated distance well.  Stairs            Wheelchair Mobility     Tilt Bed    Modified Rankin (Stroke Patients Only)       Balance Overall balance assessment: Needs assistance         Standing balance support: During functional activity, Reliant on assistive device for balance Standing balance-Leahy Scale: Fair                               Pertinent Vitals/Pain Pain Assessment Pain Assessment: Faces Faces Pain Scale: Hurts even more Pain Location: R hip/thigh Pain Descriptors / Indicators: Aching, Grimacing, Operative site guarding Pain Intervention(s): Limited activity within patient's tolerance, Monitored during session, Repositioned    Home Living Family/patient expects to be discharged to:: Private residence Living Arrangements: Spouse/significant other Available Help at Discharge: Family Type of Home: House Home Access: Stairs to enter Entrance Stairs-Rails: None Entrance Stairs-Number of Steps: 1+1 Alternate Level Stairs-Number of Steps: 1 flight Home Layout: Able to live on main level with bedroom/bathroom;Two level Home Equipment: Agricultural consultant (2 wheels)      Prior Function Prior Level of Function : Independent/Modified Independent                     Extremity/Trunk Assessment   Upper Extremity Assessment Upper Extremity Assessment: Overall WFL for tasks assessed    Lower Extremity Assessment Lower Extremity Assessment: Generalized  weakness    Cervical / Trunk Assessment Cervical / Trunk Assessment: Normal  Communication   Communication Communication: No apparent difficulties    Cognition Arousal: Alert Behavior During Therapy: WFL for tasks assessed/performed   PT - Cognitive impairments: No apparent impairments                         Following commands: Intact        Cueing Cueing Techniques: Verbal cues     General Comments      Exercises Ankle Pumps, 10 reps, active Quad Sets, 10 reps, active Heel Slides, 10 reps, active-assist with gait belt Hip Abduction, 10 reps, active assist    Assessment/Plan    PT Assessment Patient needs continued PT services  PT Problem List Decreased strength;Decreased range of motion;Decreased activity tolerance;Decreased balance;Decreased mobility;Decreased knowledge of use of DME;Pain       PT Treatment Interventions DME instruction;Gait training;Functional mobility training;Therapeutic activities;Therapeutic exercise;Stair training;Patient/family education;Balance training    PT Goals (Current goals can be found in the Care Plan section)  Acute Rehab PT Goals Patient Stated Goal: home PT Goal Formulation: With patient Time For Goal Achievement: 02/20/24 Potential to Achieve Goals: Good    Frequency 7X/week     Co-evaluation               AM-PAC PT 6 Clicks Mobility  Outcome Measure Help needed turning from your back to your side while in a flat bed without using bedrails?: A Little Help needed moving from lying on your back to sitting on the side of a flat bed without using bedrails?: A Little Help needed moving to and from a bed to a chair (including a wheelchair)?: A Little Help needed standing up from a chair using your arms (e.g., wheelchair or bedside chair)?: A Little Help needed to walk in hospital room?: A Little Help needed climbing 3-5 steps with a railing? : A Little 6 Click Score: 18    End of Session Equipment Utilized During Treatment: Gait belt Activity Tolerance: Patient tolerated treatment well Patient left: in chair;with call bell/phone within reach;with family/visitor present        Time: 1191-4782 PT Time Calculation (min) (ACUTE ONLY): 17 min   Charges:   PT Evaluation $PT Eval Low Complexity: 1 Low   PT General Charges $$ ACUTE PT VISIT: 1 Visit             Tanda Falter, PT Acute Rehabilitation  Office: [phone]

## 2024-02-06 NOTE — Op Note (Signed)
 02/05/2024  8:02 AM  PATIENT:  Sarah Saunders    PRE-OPERATIVE DIAGNOSIS:  INFECTED RIGHT HIP PROSTHESIS  POST-OPERATIVE DIAGNOSIS:  Same  PROCEDURE:  REVISION, TOTAL ARTHROPLASTY, HIP, ANTERIOR APPROACH  SURGEON:  [name], MD  ASSISTANT: Marzella Solan, PA-C, he was present and scrubbed throughout the case, critical for completion in a timely fashion, and for retraction, instrumentation, and closure.   ANESTHESIA:   Spinal  PREOPERATIVE INDICATIONS:  Sarah Saunders is a  48 y.o. female with a diagnosis of INFECTED RIGHT HIP PROSTHESIS who failed conservative measures and elected for surgical management.    The risks benefits and alternatives were discussed with the patient preoperatively including but not limited to the risks of infection, bleeding, nerve injury, cardiopulmonary complications, the need for revision surgery, among others, and the patient was willing to proceed.  OPERATIVE IMPLANTS: Stryker head and liner replacement  OPERATIVE FINDINGS: Purulent fluid at deep hip joint as well as just superficial to the tensor muscle  BLOOD LOSS: 200  COMPLICATIONS: None  TOURNIQUET TIME: None  OPERATIVE PROCEDURE:  Patient was identified in the preoperative holding area and site was marked by me She was transported to the operating theater and placed on the table in supine position taking care to pad all bony prominences. After a preincinduction time out anesthesia was induced. The right lower extremity was prepped and draped in normal sterile fashion and a pre-incision timeout was performed. She received Ancef  after cultures for preoperative antibiotics.   She was placed on the Hana bed with appropriate padding  Made an incision through her previous incision on her right hip purulent fluid was noted just superficial to the tensor fascia this was collected for culture I then carried our previous incision around the tensor muscle and noted deep fluid collection around  the hip joint as well.  I performed a thorough debridement of all devitalized tissue and all pericapsular tissue that could be removed this was done with a rondure Bovie and pickups  I removed the polyethylene liner as well as the head ball.  I examined the trunnion of the stem the ceramic ball had only been in place for a few weeks there was no where to this I elected to go back with a ceramic ball given her young age.  I then performed repeat irrigation with 3 L of saline I performed additional brief debridement as well as more irrigation  Next I irrigated with Aricept  We then redraped changed instruments regloved I reimplanted same sized polyethylene component of the acetabular cup as well as ceramic component of the femoral stem.  Thus completing revision of both components  Next I placed vancomycin powder around the joint space and superficially I performed a layered closure with monofilament suture and a negative pressure dressing was applied  POST OPERATIVE PLAN: She will be admitted to the hospital infectious disease consult weightbearing as tolerated advance with therapy

## 2024-02-06 NOTE — Plan of Care (Signed)
  Problem: Education: Goal: Knowledge of General Education information will improve Description: Including pain rating scale, medication(s)/side effects and non-pharmacologic comfort measures Outcome: Progressing   Problem: Coping: Goal: Level of anxiety will decrease Outcome: Progressing   Problem: Safety: Goal: Ability to remain free from injury will improve Outcome: Progressing   Problem: Education: Goal: Knowledge of the prescribed therapeutic regimen will improve Outcome: Progressing   Problem: Clinical Measurements: Goal: Postoperative complications will be avoided or minimized Outcome: Progressing   Problem: Pain Management: Goal: Pain level will decrease with appropriate interventions Outcome: Progressing

## 2024-02-06 NOTE — Progress Notes (Signed)
 Physical Therapy Treatment Patient Details Name: AMORIA MCLEES MRN: [id number] DOB: 01-01-1976 Today's Date: 02/06/2024   History of Present Illness 48 yo female s/p R THA revision 02/05/24. Hx of anemia, L THA, obesity, anxiety, SVT    PT Comments  Pt agreeable to 2nd session. Progressing well. Pain creeping up a bit-encouraged her to request her pain meds as needed. Will continue to follow.     If plan is discharge home, recommend the following: A little help with walking and/or transfers;A little help with bathing/dressing/bathroom;Assistance with cooking/housework;Assist for transportation;Help with stairs or ramp for entrance   Can travel by private vehicle        Equipment Recommendations  None recommended by PT    Recommendations for Other Services       Precautions / Restrictions Precautions Precautions: Fall Restrictions Weight Bearing Restrictions Per Provider Order: Yes RLE Weight Bearing Per Provider Order: Weight bearing as tolerated     Mobility  Bed Mobility Overal bed mobility: Needs Assistance Bed Mobility: Supine to Sit     Supine to sit: Contact guard, HOB elevated, Used rails     General bed mobility comments: oob in recliner    Transfers Overall transfer level: Needs assistance Equipment used: Rolling walker (2 wheels) Transfers: Sit to/from Stand Sit to Stand: From elevated surface, Contact guard assist           General transfer comment: Cues for safety, technqiue, hand placement. Increased time.    Ambulation/Gait Ambulation/Gait assistance: Contact guard assist Gait Distance (Feet): 150 Feet Assistive device: Rolling walker (2 wheels) Gait Pattern/deviations: Step-through pattern, Decreased stride length       General Gait Details: Cues for safety. Slow gait speed. Tolerated distance well.   Stairs             Wheelchair Mobility     Tilt Bed    Modified Rankin (Stroke Patients Only)       Balance Overall  balance assessment: Needs assistance         Standing balance support: During functional activity, Reliant on assistive device for balance Standing balance-Leahy Scale: Fair                              Hotel manager: No apparent difficulties  Cognition Arousal: Alert Behavior During Therapy: WFL for tasks assessed/performed   PT - Cognitive impairments: No apparent impairments                         Following commands: Intact      Cueing Cueing Techniques: Verbal cues  Exercises      General Comments        Pertinent Vitals/Pain Pain Assessment Pain Assessment: Faces Faces Pain Scale: Hurts even more Pain Location: R hip/thigh Pain Descriptors / Indicators: Aching, Grimacing, Operative site guarding Pain Intervention(s): Monitored during session, Repositioned    Home Living Family/patient expects to be discharged to:: Private residence Living Arrangements: Spouse/significant other Available Help at Discharge: Family Type of Home: House Home Access: Stairs to enter Entrance Stairs-Rails: None Entrance Stairs-Number of Steps: 1+1 Alternate Level Stairs-Number of Steps: 1 flight Home Layout: Able to live on main level with bedroom/bathroom;Two level Home Equipment: Agricultural consultant (2 wheels)      Prior Function            PT Goals (current goals can now be found in the care plan section) Acute Rehab PT  Goals Patient Stated Goal: home PT Goal Formulation: With patient Time For Goal Achievement: 02/20/24 Potential to Achieve Goals: Good Progress towards PT goals: Progressing toward goals    Frequency    7X/week      PT Plan      Co-evaluation              AM-PAC PT 6 Clicks Mobility   Outcome Measure  Help needed turning from your back to your side while in a flat bed without using bedrails?: A Little Help needed moving from lying on your back to sitting on the side of a flat bed  without using bedrails?: A Little Help needed moving to and from a bed to a chair (including a wheelchair)?: A Little Help needed standing up from a chair using your arms (e.g., wheelchair or bedside chair)?: A Little Help needed to walk in hospital room?: A Little Help needed climbing 3-5 steps with a railing? : A Little 6 Click Score: 18    End of Session Equipment Utilized During Treatment: Gait belt Activity Tolerance: Patient tolerated treatment well Patient left: in chair;with call bell/phone within reach;with family/visitor present   PT Visit Diagnosis: Difficulty in walking, not elsewhere classified (R26.2);Pain Pain - Right/Left: Right Pain - part of body: Hip     Time: 1315-1331 PT Time Calculation (min) (ACUTE ONLY): 16 min  Charges:    $Gait Training: 8-22 mins PT General Charges $$ ACUTE PT VISIT: 1 Visit                        Tanda Falter, PT Acute Rehabilitation  Office: [phone]

## 2024-02-06 NOTE — TOC Initial Note (Addendum)
 Transition of Care [facility]) - Initial/Assessment Note    Patient Details  Name: Sarah Saunders MRN: [id number] Date of Birth: [birth year]  [facility]) CM/SW Contact:    Bari Leys, RN Phone Number: 02/06/2024, 9:53 AM  Clinical Narrative:  Met with patient at bedside to introduce role of TOC/NCM and review for dc planning, patient aware will likely need ID consult and possible PICC line. Resides with private residence with spouse, reports has RW at home, no current home health services. Referral sent to [facility] with [facility]  for  home iv abx, awaiting ID consult. Prevena wound vac in place.  TOC will continue to follow.                   Expected Discharge Plan: Home w Home Health Services Barriers to Discharge: Continued Medical Work up   Patient Goals and CMS Choice Patient states their goals for this hospitalization and ongoing recovery are:: return home CMS Medicare.gov Compare Post Acute Care list provided to:: Patient Choice offered to / list presented to : Patient [place] ownership interest in [facility].provided to:: Patient    Expected Discharge Plan and Services       Living arrangements for the past 2 months: Single Family Home                                      Prior Living Arrangements/Services Living arrangements for the past 2 months: Single Family Home Lives with:: Spouse Patient language and need for interpreter reviewed:: Yes Do you feel safe going back to the place where you live?: Yes      Need for Family Participation in Patient Care: Yes (Comment) Care giver support system in place?: Yes (comment) Current home services: DME (RW) Criminal Activity/Legal Involvement Pertinent to Current Situation/Hospitalization: No - Comment as needed  Activities of Daily Living   ADL Screening (condition at time of admission) Independently performs ADLs?: No Does the patient have a NEW difficulty with  bathing/dressing/toileting/self-feeding that is expected to last >3 days?: No Does the patient have a NEW difficulty with getting in/out of bed, walking, or climbing stairs that is expected to last >3 days?: Yes (Initiates electronic notice to provider for possible PT consult) Does the patient have a NEW difficulty with communication that is expected to last >3 days?: No Is the patient deaf or have difficulty hearing?: No Does the patient have difficulty seeing, even when wearing glasses/contacts?: No Does the patient have difficulty concentrating, remembering, or making decisions?: No  Permission Sought/Granted                  Emotional Assessment Appearance:: Appears stated age Attitude/Demeanor/Rapport: Engaged Affect (typically observed): Accepting Orientation: : Oriented to Self, Oriented to Place, Oriented to  Time, Oriented to Situation Alcohol / Substance Use: Not Applicable Psych Involvement: No (comment)  Admission diagnosis:  Hip pain, acute, right [M25.551] History of revision of total replacement of right hip joint [Z96.641] Patient Active Problem List   Diagnosis Date Noted   History of revision of total replacement of right hip joint 02/05/2024   ABLA (acute blood loss anemia) 10/20/2023   Pregnancy 10/19/2023   S/P C-section 10/19/2023   AMA (advanced maternal age) multigravida 35+, third trimester 10/19/2023   Decreased fetal movement affecting management of pregnancy in third trimester, fetus 1 10/19/2023   Placental abruption in third  trimester 10/19/2023   Short cervix affecting pregnancy 09/25/2023   Kidney stone complicating pregnancy 07/05/2023   IUFD at 20 weeks or more of gestation 06/19/2022   PSVT (paroxysmal supraventricular tachycardia) (HCC) 05/26/2019   Iron  deficiency anemia 12/13/2016   Morbid obesity (HCC) 10/09/2016   Tachycardia 01/08/2012   Benign neoplasm of skin 03/06/2008   POLYCYSTIC OVARIAN DISEASE 02/27/2008   Overweight  02/27/2008   PCP:  Virgle Grime, MD Pharmacy:   [facility] [phone] Jonette Nestle, [place] - [address] AT [facility] [address] [place] [address] Phone: [phone] Fax: [phone]  [facility] [id number] - [place], [place] - [address] [place] [address] Phone: [phone] Fax: [phone]  Melodee Spruce [facility] - [facility] [address] [place] [address] Phone: [phone] Fax: [phone]     Social Drivers of Health (SDOH) Social History: SDOH Screenings   Food Insecurity: No Food Insecurity (02/05/2024)  Housing: Low Risk  (02/05/2024)  Transportation Needs: No Transportation Needs (02/05/2024)  Utilities: Not At Risk (02/05/2024)  Social Connections: Unknown (02/05/2024)  Tobacco Use: Low Risk  (02/05/2024)   SDOH Interventions:     Readmission Risk Interventions     No data to display

## 2024-02-07 ENCOUNTER — Other Ambulatory Visit: Payer: Self-pay

## 2024-02-07 LAB — CK: Total CK: 126 U/L (ref 38–234)

## 2024-02-07 LAB — CBC
HCT: 29.8 % — ABNORMAL LOW (ref 36.0–46.0)
Hemoglobin: 8.9 g/dL — ABNORMAL LOW (ref 12.0–15.0)
MCH: 27.3 pg (ref 26.0–34.0)
MCHC: 29.9 g/dL — ABNORMAL LOW (ref 30.0–36.0)
MCV: 91.4 fL (ref 80.0–100.0)
Platelets: 248 10*3/uL (ref 150–400)
RBC: 3.26 MIL/uL — ABNORMAL LOW (ref 3.87–5.11)
RDW: 14.3 % (ref 11.5–15.5)
WBC: 9.5 10*3/uL (ref 4.0–10.5)
nRBC: 0 % (ref 0.0–0.2)

## 2024-02-07 MED ORDER — SODIUM CHLORIDE 0.9% FLUSH: 10.0000 mL | INTRAVENOUS | Status: DC | PRN

## 2024-02-07 MED ORDER — CHLORHEXIDINE GLUCONATE CLOTH 2 % EX PADS
6.0000 | MEDICATED_PAD | Freq: Every day | CUTANEOUS | Status: DC
  Administered 2024-02-08: 6 via TOPICAL

## 2024-02-07 MED ORDER — SODIUM CHLORIDE 0.9% FLUSH
10.0000 mL | Freq: Two times a day (BID) | INTRAVENOUS | Status: DC
  Administered 2024-02-07: 10 mL

## 2024-02-07 NOTE — Progress Notes (Signed)
 Peripherally Inserted Central Catheter Placement  The IV Nurse has discussed with the patient and/or persons authorized to consent for the patient, the purpose of this procedure and the potential benefits and risks involved with this procedure.  The benefits include less needle sticks, lab draws from the catheter, and the patient may be discharged home with the catheter. Risks include, but not limited to, infection, bleeding, blood clot (thrombus formation), and puncture of an artery; nerve damage and irregular heartbeat and possibility to perform a PICC exchange if needed/ordered by physician.  Alternatives to this procedure were also discussed.  Bard Power PICC patient education guide, fact sheet on infection prevention and patient information card has been provided to patient /or left at bedside.  PICC inserted by Nadean August, RN   PICC Placement Documentation  PICC Single Lumen 02/07/24 Right Basilic 37 cm 0 cm (Active)  Indication for Insertion or Continuance of Line Home intravenous therapies (PICC only) 02/07/24 1733  Exposed Catheter (cm) 0 cm 02/07/24 1733  Site Assessment Clean, Dry, Intact 02/07/24 1733  Line Status Flushed;Saline locked;Blood return noted 02/07/24 1733  Dressing Type Transparent;Securing device 02/07/24 1733  Dressing Status Antimicrobial disc/dressing in place;Clean, Dry, Intact 02/07/24 1733  Line Care Connections checked and tightened 02/07/24 1733  Line Adjustment (NICU/IV Team Only) No 02/07/24 1733  Dressing Intervention New dressing 02/07/24 1733  Dressing Change Due 02/14/24 02/07/24 1733       Christop Hippert, Miles Allan 02/07/2024, 5:33 PM

## 2024-02-07 NOTE — TOC Progression Note (Addendum)
 Transition of Care [facility]) - Progression Note    Patient Details  Name: Sarah Saunders MRN: [id number] Date of Birth: 23-Sep-1975  Transition of Care [facility]) CM/SW Contact  Bari Leys, RN Phone Number: 02/07/2024, 3:08 PM  Clinical Narrative:  Plan for dc home with home iv abx. Met with patient at bedside to review [facility] agency preference, patient had no preference.  Request sent to out [facility] agencies [place], [place], [place], [place], [place]), no [facility] acceptance. Request sent to [facility] with [employer] for request to use [employer] RN, [name] agreeable, states she plans to see patent tomorrow for teaching/education. Request sent to attending for [facility] RN order. TOC will continue to follow.      Expected Discharge Plan: Home w Home Health Services Barriers to Discharge: Continued Medical Work up  Expected Discharge Plan and Services       Living arrangements for the past 2 months: Single Family Home                                       Social Determinants of Health (SDOH) Interventions SDOH Screenings   Food Insecurity: No Food Insecurity (02/05/2024)  Housing: Low Risk  (02/05/2024)  Transportation Needs: No Transportation Needs (02/05/2024)  Utilities: Not At Risk (02/05/2024)  Social Connections: Unknown (02/05/2024)  Tobacco Use: Low Risk  (02/05/2024)    Readmission Risk Interventions     No data to display

## 2024-02-07 NOTE — Progress Notes (Signed)
 Physical Therapy Treatment Patient Details Name: Sarah Saunders MRN: [id number] DOB: 1975/12/20 Today's Date: 02/07/2024   History of Present Illness 48 yo female s/p R THA revision 02/05/24. No formal precautions. Hx of anemia, L THA, obesity, anxiety, SVT    PT Comments  Pt is progressing well with mobility, she ambulated 450' with RW, no loss of balance.  Reviewed THA HEP, pt demonstrates good understanding. Pt puts forth good effort.     If plan is discharge home, recommend the following: A little help with walking and/or transfers;A little help with bathing/dressing/bathroom;Assistance with cooking/housework;Assist for transportation;Help with stairs or ramp for entrance   Can travel by private vehicle        Equipment Recommendations  None recommended by PT    Recommendations for Other Services       Precautions / Restrictions Precautions Precautions: Fall Recall of Precautions/Restrictions: Intact Restrictions Weight Bearing Restrictions Per Provider Order: No RLE Weight Bearing Per Provider Order: Weight bearing as tolerated     Mobility  Bed Mobility Overal bed mobility: Needs Assistance Bed Mobility: Supine to Sit     Supine to sit: HOB elevated, Used rails, Supervision     General bed mobility comments: Pt used gait belt as leg lifter. Relied on bedrail as well. Increased time. Cues provided.    Transfers Overall transfer level: Needs assistance Equipment used: Rolling walker (2 wheels) Transfers: Sit to/from Stand Sit to Stand: Supervision           General transfer comment: Cues for safety, technqiue, hand placement. Increased time.    Ambulation/Gait Ambulation/Gait assistance: Supervision Gait Distance (Feet): 450 Feet Assistive device: Rolling walker (2 wheels) Gait Pattern/deviations: Step-to pattern Gait velocity: decr     General Gait Details: steady, no loss of balance, 6/10 R hip pain   Stairs             Wheelchair  Mobility     Tilt Bed    Modified Rankin (Stroke Patients Only)       Balance Overall balance assessment: Needs assistance   Sitting balance-Leahy Scale: Good     Standing balance support: During functional activity, Reliant on assistive device for balance Standing balance-Leahy Scale: Fair                              Hotel manager: No apparent difficulties  Cognition Arousal: Alert Behavior During Therapy: WFL for tasks assessed/performed   PT - Cognitive impairments: No apparent impairments                         Following commands: Intact      Cueing Cueing Techniques: Verbal cues  Exercises Total Joint Exercises Ankle Circles/Pumps: AROM, Both, 10 reps, Supine Quad Sets: AROM, Both, 10 reps, Supine Short Arc Quad: AROM, Right, 10 reps, Supine Heel Slides: AAROM, Right, 10 reps, Supine Hip ABduction/ADduction: AAROM, Right, 10 reps, Supine    General Comments        Pertinent Vitals/Pain Pain Assessment Pain Score: 6  Pain Location: R hip/thigh Pain Descriptors / Indicators: Sore Pain Intervention(s): Limited activity within patient's tolerance, Monitored during session, Premedicated before session, Ice applied, Repositioned    Home Living                          Prior Function  PT Goals (current goals can now be found in the care plan section) Acute Rehab PT Goals Patient Stated Goal: home PT Goal Formulation: With patient Time For Goal Achievement: 02/20/24 Potential to Achieve Goals: Good Progress towards PT goals: Progressing toward goals    Frequency    7X/week      PT Plan      Co-evaluation              AM-PAC PT 6 Clicks Mobility   Outcome Measure  Help needed turning from your back to your side while in a flat bed without using bedrails?: A Little Help needed moving from lying on your back to sitting on the side of a flat bed without using  bedrails?: A Little Help needed moving to and from a bed to a chair (including a wheelchair)?: A Little Help needed standing up from a chair using your arms (e.g., wheelchair or bedside chair)?: A Little Help needed to walk in hospital room?: A Little Help needed climbing 3-5 steps with a railing? : A Little 6 Click Score: 18    End of Session Equipment Utilized During Treatment: Gait belt Activity Tolerance: Patient tolerated treatment well Patient left: in chair;with call bell/phone within reach;with family/visitor present   PT Visit Diagnosis: Difficulty in walking, not elsewhere classified (R26.2);Pain Pain - Right/Left: Right Pain - part of body: Hip     Time: 9528-4132 PT Time Calculation (min) (ACUTE ONLY): 35 min  Charges:    $Gait Training: 8-22 mins $Therapeutic Exercise: 8-22 mins PT General Charges $$ ACUTE PT VISIT: 1 Visit                     Lynann Sandman Kistler PT 02/07/2024  Acute Rehabilitation Services  Office [phone]

## 2024-02-07 NOTE — Plan of Care (Signed)
 Patient verbalized understanding of care plan teaching.

## 2024-02-07 NOTE — Progress Notes (Signed)
 Physical Therapy Treatment Patient Details Name: Sarah Saunders MRN: [id number] DOB: [birth year] Today's Date: 02/07/2024   History of Present Illness 48 yo female s/p R THA revision 02/05/24. No formal precautions. Hx of anemia, L THA, obesity, anxiety, SVT    PT Comments  Pt is mobilizing well, she ambulated 500' with RW, no loss of balance, 6/10 R hip pain. Pt tolerated progression of THA HEP.     If plan is discharge home, recommend the following: A little help with walking and/or transfers;A little help with bathing/dressing/bathroom;Assistance with cooking/housework;Assist for transportation;Help with stairs or ramp for entrance   Can travel by private vehicle        Equipment Recommendations  None recommended by PT    Recommendations for Other Services       Precautions / Restrictions Precautions Precautions: Fall Recall of Precautions/Restrictions: Intact Restrictions Weight Bearing Restrictions Per Provider Order: No RLE Weight Bearing Per Provider Order: Weight bearing as tolerated     Mobility  Bed Mobility Overal bed mobility: Needs Assistance Bed Mobility: Supine to Sit     Supine to sit: HOB elevated, Used rails, Supervision     General bed mobility comments: up in recliner    Transfers Overall transfer level: Needs assistance Equipment used: Rolling walker (2 wheels) Transfers: Sit to/from Stand Sit to Stand: Supervision           General transfer comment: Cues for safety, technqiue, hand placement. Increased time.    Ambulation/Gait Ambulation/Gait assistance: Supervision Gait Distance (Feet): 500 Feet Assistive device: Rolling walker (2 wheels) Gait Pattern/deviations: Step-through pattern, Decreased stride length Gait velocity: decr     General Gait Details: steady, no loss of balance, 6/10 R hip pain   Stairs             Wheelchair Mobility     Tilt Bed    Modified Rankin (Stroke Patients Only)       Balance  Overall balance assessment: Needs assistance   Sitting balance-Leahy Scale: Good     Standing balance support: During functional activity, Reliant on assistive device for balance Standing balance-Leahy Scale: Fair                              Hotel manager: No apparent difficulties  Cognition Arousal: Alert Behavior During Therapy: WFL for tasks assessed/performed   PT - Cognitive impairments: No apparent impairments                         Following commands: Intact      Cueing Cueing Techniques: Verbal cues  Exercises Total Joint Exercises Ankle Circles/Pumps: AROM, Both, 10 reps, Supine Quad Sets: AROM, Both, 10 reps, Supine Short Arc Quad: AROM, Right, Supine, 15 reps Heel Slides: AAROM, Right, Supine, 15 reps Hip ABduction/ADduction: AAROM, Right, Supine, 15 reps Long Arc Quad: AROM, Right, 15 reps, Seated    General Comments        Pertinent Vitals/Pain Pain Assessment Pain Score: 6  Pain Location: R hip Pain Descriptors / Indicators: Sore Pain Intervention(s): Limited activity within patient's tolerance, Monitored during session, Premedicated before session, Ice applied, Repositioned    Home Living                          Prior Function            PT Goals (current goals can now be  found in the care plan section) Acute Rehab PT Goals Patient Stated Goal: home, 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, go bowling PT Goal Formulation: With patient/family Time For Goal Achievement: 02/20/24 Potential to Achieve Goals: Good Progress towards PT goals: Progressing toward goals    Frequency    7X/week      PT Plan      Co-evaluation              AM-PAC PT 6 Clicks Mobility   Outcome Measure  Help needed turning from your back to your side while in a flat bed without using bedrails?: A Little Help needed moving from lying on your back to sitting on the side of a flat  bed without using bedrails?: A Little Help needed moving to and from a bed to a chair (including a wheelchair)?: None Help needed standing up from a chair using your arms (e.g., wheelchair or bedside chair)?: None Help needed to walk in hospital room?: None Help needed climbing 3-5 steps with a railing? : A Little 6 Click Score: 21    End of Session Equipment Utilized During Treatment: Gait belt Activity Tolerance: Patient tolerated treatment well Patient left: in chair;with call bell/phone within reach;with family/visitor present   PT Visit Diagnosis: Difficulty in walking, not elsewhere classified (R26.2);Pain Pain - Right/Left: Right Pain - part of body: Hip     Time: 1610-9604 PT Time Calculation (min) (ACUTE ONLY): 29 min  Charges:    $Gait Training: 8-22 mins $Therapeutic Exercise: 8-22 mins PT General Charges $$ ACUTE PT VISIT: 1 Visit                     Lynann Sandman Kistler PT 02/07/2024  Acute Rehabilitation Services  Office [phone]

## 2024-02-07 NOTE — Consult Note (Signed)
 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 - Taken to the OR on 6/18 with ORIF findings showing right hip purulent fluid.  Cultures sent. - Patient has been on amoxicillin  prior to hospitalization.  Noted Synovasure positive for E faecalis.  Recommendations:  -Daptomycin and Unasyn.  Anticipate 6 weeks of IV antibiotics followed by p.o. suppression for at least 6 months. - Place PICC - Follow-up cultures  Evaluation of this patient requires complex antimicrobial therapy evaluation and counseling + isolation needs for disease transmission risk assessment and mitigation   Microbiology:   Antibiotics: Amox 6/17- Cefazollin 6/170  Vancomycin 6/17-  Cultures: Blood  Urine  Other  OR culture 6/17 no growth HPI: Sarah Saunders is a 48 y.o. female with past medical history of anemia, allergic rhinitis, pituitary tumor, PCOD, procalcitonin normal, SVT, GERD, bariatric surgery, migraines, anxiety admitted with infected right hip prosthesis.  Patient has been on amoxicillin  prior to hospitalization.  She was taken to the OR on 6/18 for revision of total arthroplasty, or findings significant for right hip purulent fluid.   Review of Systems: ROS  Past Medical History:  Diagnosis Date   Allergic rhinitis 02/27/2008   Qualifier: Diagnosis of   By: Ena Harries MD, Viktoria Gray     IMO SNOMED Dx Update Oct 2024     Anemia    iron  def   Antepartum fetal death    Anxiety    Arthritis    Genital herpes    GERD (gastroesophageal reflux disease)    History of bariatric surgery    History of kidney stones     History of left hip replacement    History of urinary stone    Migraines    Morbid obesity (HCC) 10/09/2016   Pituitary tumor    Polycystic ovary disease    Prolactinoma (HCC)    SVT (supraventricular tachycardia) (HCC)    Vitamin D deficiency     Social History   Tobacco Use   Smoking status: Never   Smokeless tobacco: Never  Vaping Use   Vaping status: Never Used  Substance Use Topics   Alcohol use: No   Drug use: No    Family History  Problem Relation Age of Onset   Hypertension Father    Heart failure Father    Hypertension Mother    Heart failure Brother    Scheduled Meds:  aspirin  81 mg Oral BID   docusate sodium   100 mg Oral BID   ipratropium  1 spray Each Nare BID   liothyronine  5 mcg Oral Daily   loratadine   10 mg Oral Daily   oxyCODONE   10 mg Oral Q12H   pantoprazole   40 mg Oral Daily   sertraline   200 mg Oral Daily   Continuous Infusions:  ampicillin-sulbactam (UNASYN) IV 3 g (02/06/24 2205)   DAPTOmycin Stopped (02/06/24 1215)   PRN Meds:.acetaminophen , albuterol , alum & mag hydroxide-simeth, artificial tears, bisacodyl, diphenhydrAMINE , HYDROmorphone  (DILAUDID ) injection, HYDROmorphone , magnesium  citrate, menthol -cetylpyridinium **OR** phenol, methocarbamol  **OR** methocarbamol  (ROBAXIN ) injection, metoCLOPramide **OR** metoCLOPramide (REGLAN) injection, metoprolol  tartrate,  ondansetron  **OR** ondansetron  (ZOFRAN ) IV, oxyCODONE , polyethylene glycol, traZODone Allergies  Allergen Reactions   Butorphanol Hives and Itching   Other Other (See Comments)    Seasonal allergies    OBJECTIVE: Blood pressure 126/66, pulse 79, temperature 97.7 F (36.5 C), temperature source Oral, resp. rate 17, height 5' 7.5 (1.715 m), weight 114.2 kg, last menstrual period 04/05/2023, SpO2 97%, unknown if currently breastfeeding.  Physical Exam Constitutional:      Appearance: Normal appearance.  HENT:     Head: Normocephalic and atraumatic.     Right Ear: Tympanic  membrane normal.     Left Ear: Tympanic membrane normal.     Nose: Nose normal.     Mouth/Throat:     Mouth: Mucous membranes are moist.   Eyes:     Extraocular Movements: Extraocular movements intact.     Conjunctiva/sclera: Conjunctivae normal.     Pupils: Pupils are equal, round, and reactive to light.    Cardiovascular:     Rate and Rhythm: Normal rate and regular rhythm.     Heart sounds: No murmur heard.    No friction rub. No gallop.  Pulmonary:     Effort: Pulmonary effort is normal.     Breath sounds: Normal breath sounds.  Abdominal:     General: Abdomen is flat.     Palpations: Abdomen is soft.   Musculoskeletal:     Comments: R hip vac   Skin:    General: Skin is warm and dry.   Neurological:     General: No focal deficit present.     Mental Status: She is alert and oriented to person, place, and time.   Psychiatric:        Mood and Affect: Mood normal.     Lab Results Lab Results  Component Value Date   WBC 9.5 02/07/2024   HGB 8.9 (L) 02/07/2024   HCT 29.8 (L) 02/07/2024   MCV 91.4 02/07/2024   PLT 248 02/07/2024    Lab Results  Component Value Date   CREATININE 0.66 02/06/2024   BUN 19 02/06/2024   NA 136 02/06/2024   K 4.8 02/06/2024   CL 108 02/06/2024   CO2 24 02/06/2024    Lab Results  Component Value Date   ALT 43 11/13/2023   AST 43 (H) 11/13/2023   ALKPHOS 154 (H) 11/13/2023   BILITOT 0.2 11/13/2023       Orlie Bjornstad, MD [facility] 02/07/2024, 4:47 AM

## 2024-02-07 NOTE — Progress Notes (Signed)
 Subjective: Patient reports pain as moderate. More than yesterday but has been very active today. Tolerating diet.  Urinating.   No CP, SOB.  Has mobilized OOB with PT. Wound vac in place and functioning.   Objective:   VITALS:   Vitals:   02/07/24 1351 02/07/24 2013 02/08/24 0633 02/08/24 1329  BP: 109/64 (!) 109/58 (!) 105/59 109/67  Pulse: 92 85 84 (!) 101  Resp: 18 17  17   Temp: 98.2 F (36.8 C) 98.5 F (36.9 C) 98.3 F (36.8 C) 97.7 F (36.5 C)  TempSrc: Oral Oral Oral   SpO2: 97% 98% 96% 100%  Weight:      Height:          Latest Ref Rng & Units 02/07/2024    3:44 AM 02/06/2024    3:38 AM 01/28/2024   11:38 AM  CBC  WBC 4.0 - 10.5 K/uL 9.5  7.4  5.7   Hemoglobin 12.0 - 15.0 g/dL 8.9  9.7  62.1   Hematocrit 36.0 - 46.0 % 29.8  31.8  34.0   Platelets 150 - 400 K/uL 248  300  311       Latest Ref Rng & Units 02/06/2024    3:38 AM 01/28/2024   11:38 AM 11/13/2023    9:30 AM  BMP  Glucose 70 - 99 mg/dL 308  657  79   BUN 6 - 20 mg/dL 19  15  16    Creatinine 0.44 - 1.00 mg/dL 8.46  9.62  9.52   Sodium 135 - 145 mmol/L 136  137  142   Potassium 3.5 - 5.1 mmol/L 4.8  4.3  3.5   Chloride 98 - 111 mmol/L 108  106  112   CO2 22 - 32 mmol/L 24  23  26    Calcium  8.9 - 10.3 mg/dL 8.7  9.2  8.7    Intake/Output      06/19 0701 06/20 0700 06/20 0701 06/21 0700   P.O. 120 820   I.V. (mL/kg)     Other     IV Piggyback 66 166   Total Intake(mL/kg) 186 (1.6) 986 (8.6)   Urine (mL/kg/hr) 1150 (0.4)    Stool     Total Output 1150    Net -964 +986        Urine Occurrence  3 x      Physical Exam: General: NAD.  Sitting up in bed eating, calm, comfortable  Resp: No increased wob Cardio: regular rate and rhythm ABD soft Neurologically intact MSK Neurovascularly intact Sensation intact distally Intact pulses distally Dorsiflexion/Plantar flexion intact Incision: dressing C/D/I Wound vac on with 150 cc of blood in the canister  Assessment: 3 Days Post-Op  S/P  Procedure(s) (LRB): REVISION, TOTAL ARTHROPLASTY, HIP, ANTERIOR APPROACH (Right) by Dr. Deloras Fess. Murphy on 02/05/24  Principal Problem:   History of revision of total replacement of right hip joint   Plan: Continue to watch for specimen results. NGTD ABX per ID. PICC line has been placed.   Advance diet Up with therapy Incentive Spirometry Elevate and Apply ice  Weightbearing: WBAT RLE Insicional and dressing care: Dressings left intact until follow-up and Reinforce dressings as needed Orthopedic device(s): Wound WUX:LKGMW hip Showering: Keep dressing dry VTE prophylaxis: Aspirin 81mg  BID x 30 days postop, SCDs, ambulation Pain control: PRN  Follow - up plan: 1 week Contact information:  Randal Bury MD, Marzella Solan PA-C  Dispo: TBD pending pain control and PT evaluation. Hopefully tomorrow or Sunday  at the latest.     Ander Bame Office [phone] 02/08/2024, 6:23 PM

## 2024-02-07 NOTE — Plan of Care (Signed)
  Problem: Education: Goal: Knowledge of General Education information will improve Description: Including pain rating scale, medication(s)/side effects and non-pharmacologic comfort measures Outcome: Progressing   Problem: Health Behavior/Discharge Planning: Goal: Ability to manage health-related needs will improve Outcome: Progressing   Problem: Clinical Measurements: Goal: Respiratory complications will improve Outcome: Progressing   Problem: Activity: Goal: Risk for activity intolerance will decrease Outcome: Progressing   Problem: Nutrition: Goal: Adequate nutrition will be maintained Outcome: Progressing   Problem: Coping: Goal: Level of anxiety will decrease Outcome: Progressing   Problem: Elimination: Goal: Will not experience complications related to bowel motility Outcome: Progressing Goal: Will not experience complications related to urinary retention Outcome: Progressing   Problem: Pain Managment: Goal: General experience of comfort will improve and/or be controlled Outcome: Progressing   Problem: Safety: Goal: Ability to remain free from injury will improve Outcome: Progressing

## 2024-02-08 ENCOUNTER — Inpatient Hospital Stay (HOSPITAL_COMMUNITY)

## 2024-02-08 DIAGNOSIS — B952 Enterococcus as the cause of diseases classified elsewhere: Secondary | ICD-10-CM | POA: Diagnosis not present

## 2024-02-08 DIAGNOSIS — Z96641 Presence of right artificial hip joint: Secondary | ICD-10-CM | POA: Diagnosis not present

## 2024-02-08 DIAGNOSIS — T8451XA Infection and inflammatory reaction due to internal right hip prosthesis, initial encounter: Secondary | ICD-10-CM | POA: Diagnosis not present

## 2024-02-08 MED ORDER — SODIUM CHLORIDE 0.9 % IV SOLN
2.0000 g | INTRAVENOUS | Status: DC
  Administered 2024-02-08 – 2024-02-09 (×2): 2 g via INTRAVENOUS
  Filled 2024-02-08 (×2): qty 20

## 2024-02-08 MED ORDER — DAPTOMYCIN IV (FOR PTA / DISCHARGE USE ONLY): 800.0000 mg | INTRAVENOUS | 0 refills | Status: AC

## 2024-02-08 MED ORDER — CEFTRIAXONE IV (FOR PTA / DISCHARGE USE ONLY): 2.0000 g | INTRAVENOUS | 0 refills | Status: AC

## 2024-02-08 NOTE — Progress Notes (Signed)
 IVT consulted earlier in the day to assess PICC line. Pt c/o chest discomfort (similar to what pt felt when PICC line placed) off and since since placement and pain at the insertion site. CXR ordered to verify placement. Site assessed. No redness or swelling noted. Per CXR tip of PICC terminates at the brachiocephlaic SVC junction. Per PICC team, recommend not using line until exchanged. Primary RN notified in person. Diamond sent to MD & PA. Requesting PICC exchange order.

## 2024-02-08 NOTE — Progress Notes (Signed)
 Physical Therapy Treatment Patient Details Name: Sarah Saunders MRN: [id number] DOB: 1976/05/31 Today's Date: 02/08/2024   History of Present Illness 48 yo female s/p R THA revision 02/05/24. No formal precautions. Hx of anemia, L THA, obesity, anxiety, SVT    PT Comments  Pt ambulated 500' with RW, 7/10 R hip pain. Pt stated she will perform HEP independently after she receives pain medication. RN notified of pt's request for pain medication. Stairs deferred until tomorrow 2* elevated pain this session.   If plan is discharge home, recommend the following: A little help with walking and/or transfers;A little help with bathing/dressing/bathroom;Assistance with cooking/housework;Assist for transportation;Help with stairs or ramp for entrance   Can travel by private vehicle        Equipment Recommendations  None recommended by PT    Recommendations for Other Services       Precautions / Restrictions Precautions Precautions: Fall Recall of Precautions/Restrictions: Intact Restrictions Weight Bearing Restrictions Per Provider Order: No RLE Weight Bearing Per Provider Order: Weight bearing as tolerated     Mobility  Bed Mobility Overal bed mobility: Modified Independent Bed Mobility: Supine to Sit     Supine to sit: HOB elevated, Used rails, Modified independent (Device/Increase time)     General bed mobility comments: up in recliner    Transfers Overall transfer level: Modified independent Equipment used: Rolling walker (2 wheels) Transfers: Sit to/from Stand Sit to Stand: Modified independent (Device/Increase time)                Ambulation/Gait Ambulation/Gait assistance: Modified independent (Device/Increase time) Gait Distance (Feet): 500 Feet Assistive device: Rolling walker (2 wheels) Gait Pattern/deviations: Step-through pattern, Decreased stride length Gait velocity: decr     General Gait Details: steady, no loss of balance, 7/10 R hip  pain   Stairs             Wheelchair Mobility     Tilt Bed    Modified Rankin (Stroke Patients Only)       Balance Overall balance assessment: Modified Independent   Sitting balance-Leahy Scale: Good     Standing balance support: During functional activity, Reliant on assistive device for balance Standing balance-Leahy Scale: Fair                              Hotel manager: No apparent difficulties  Cognition Arousal: Alert Behavior During Therapy: WFL for tasks assessed/performed   PT - Cognitive impairments: No apparent impairments                         Following commands: Intact      Cueing Cueing Techniques: Verbal cues  Exercises Total Joint Exercises Ankle Circles/Pumps: AROM, Both, 10 reps, Supine Quad Sets: AROM, Both, 10 reps, Supine Short Arc Quad: AROM, Right, 10 reps, Supine Heel Slides: AAROM, Right, 10 reps, Supine Hip ABduction/ADduction: AAROM, Right, 10 reps, Supine Long Arc Quad: AROM, Right, 20 reps, Seated    General Comments        Pertinent Vitals/Pain Pain Assessment Pain Score: 7  Pain Location: R hip Pain Descriptors / Indicators: Sore Pain Intervention(s): Limited activity within patient's tolerance, Monitored during session, Patient requesting pain meds-RN notified, Ice applied    Home Living                          Prior Function  PT Goals (current goals can now be found in the care plan section) Acute Rehab PT Goals Patient Stated Goal: home, 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, go bowling PT Goal Formulation: With patient/family Time For Goal Achievement: 02/20/24 Potential to Achieve Goals: Good Progress towards PT goals: Goals met and updated - see care plan    Frequency    7X/week      PT Plan      Co-evaluation              AM-PAC PT 6 Clicks Mobility   Outcome Measure  Help needed turning from  your back to your side while in a flat bed without using bedrails?: None Help needed moving from lying on your back to sitting on the side of a flat bed without using bedrails?: None Help needed moving to and from a bed to a chair (including a wheelchair)?: None Help needed standing up from a chair using your arms (e.g., wheelchair or bedside chair)?: None Help needed to walk in hospital room?: None Help needed climbing 3-5 steps with a railing? : A Little 6 Click Score: 23    End of Session Equipment Utilized During Treatment: Gait belt Activity Tolerance: Patient tolerated treatment well Patient left: in chair;with call bell/phone within reach;with family/visitor present Nurse Communication: Mobility status PT Visit Diagnosis: Difficulty in walking, not elsewhere classified (R26.2);Pain Pain - Right/Left: Right Pain - part of body: Hip     Time: 1300-1315 PT Time Calculation (min) (ACUTE ONLY): 15 min  Charges:    $Gait Training: 8-22 mins $Therapeutic Exercise: 8-22 mins PT General Charges $$ ACUTE PT VISIT: 1 Visit                     Daymon Evans PT 02/08/2024  Acute Rehabilitation Services  Office [phone]

## 2024-02-08 NOTE — Progress Notes (Signed)
 Physical Therapy Treatment Patient Details Name: Sarah Saunders MRN: [id number] DOB: [birth year] Today's Date: 02/08/2024   History of Present Illness 48 yo female s/p R THA revision 02/05/24. No formal precautions. Hx of anemia, L THA, obesity, anxiety, SVT    PT Comments  Pt is mobilizing well, she ambulated 500' with RW without loss of balance, 6/10 R anterior hip pain while walking. Reviewed THA HEP, pt demonstrates good understanding. Will plan to review stair training this afternoon. From a PT standpoint she is ready to DC home.     If plan is discharge home, recommend the following: A little help with walking and/or transfers;A little help with bathing/dressing/bathroom;Assistance with cooking/housework;Assist for transportation;Help with stairs or ramp for entrance   Can travel by private vehicle        Equipment Recommendations  None recommended by PT    Recommendations for Other Services       Precautions / Restrictions Precautions Precautions: Fall Recall of Precautions/Restrictions: Intact Restrictions Weight Bearing Restrictions Per Provider Order: No RLE Weight Bearing Per Provider Order: Weight bearing as tolerated     Mobility  Bed Mobility Overal bed mobility: Modified Independent Bed Mobility: Supine to Sit     Supine to sit: HOB elevated, Used rails, Modified independent (Device/Increase time)          Transfers Overall transfer level: Modified independent Equipment used: Rolling walker (2 wheels) Transfers: Sit to/from Stand Sit to Stand: Modified independent (Device/Increase time)                Ambulation/Gait Ambulation/Gait assistance: Modified independent (Device/Increase time) Gait Distance (Feet): 500 Feet Assistive device: Rolling walker (2 wheels) Gait Pattern/deviations: Step-through pattern, Decreased stride length Gait velocity: decr     General Gait Details: steady, no loss of balance, 6/10 R hip pain   Stairs              Wheelchair Mobility     Tilt Bed    Modified Rankin (Stroke Patients Only)       Balance     Sitting balance-Leahy Scale: Good     Standing balance support: During functional activity, Reliant on assistive device for balance Standing balance-Leahy Scale: Fair                              Hotel manager: No apparent difficulties  Cognition Arousal: Alert Behavior During Therapy: WFL for tasks assessed/performed   PT - Cognitive impairments: No apparent impairments                         Following commands: Intact      Cueing Cueing Techniques: Verbal cues  Exercises Total Joint Exercises Ankle Circles/Pumps: AROM, Both, 10 reps, Supine Quad Sets: AROM, Both, 10 reps, Supine Short Arc Quad: AROM, Right, 10 reps, Supine Heel Slides: AAROM, Right, 10 reps, Supine Hip ABduction/ADduction: AAROM, Right, 10 reps, Supine    General Comments        Pertinent Vitals/Pain Pain Assessment Pain Score: 6  Pain Location: R hip Pain Descriptors / Indicators: Sore Pain Intervention(s): Limited activity within patient's tolerance, Monitored during session, Premedicated before session, Repositioned, Ice applied    Home Living                          Prior Function            PT  Goals (current goals can now be found in the care plan section) Acute Rehab PT Goals Patient Stated Goal: home, 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, go bowling PT Goal Formulation: With patient/family Time For Goal Achievement: 02/20/24 Potential to Achieve Goals: Good Progress towards PT goals: Goals met and updated - see care plan    Frequency    7X/week      PT Plan      Co-evaluation              AM-PAC PT 6 Clicks Mobility   Outcome Measure  Help needed turning from your back to your side while in a flat bed without using bedrails?: None Help needed moving from lying on your back  to sitting on the side of a flat bed without using bedrails?: None Help needed moving to and from a bed to a chair (including a wheelchair)?: None Help needed standing up from a chair using your arms (e.g., wheelchair or bedside chair)?: None Help needed to walk in hospital room?: None Help needed climbing 3-5 steps with a railing? : A Little 6 Click Score: 23    End of Session Equipment Utilized During Treatment: Gait belt Activity Tolerance: Patient tolerated treatment well Patient left: in chair;with call bell/phone within reach;with family/visitor present Nurse Communication: Mobility status PT Visit Diagnosis: Difficulty in walking, not elsewhere classified (R26.2);Pain Pain - Right/Left: Right Pain - part of body: Hip     Time: 1610-9604 PT Time Calculation (min) (ACUTE ONLY): 23 min  Charges:    $Gait Training: 8-22 mins $Therapeutic Exercise: 8-22 mins PT General Charges $$ ACUTE PT VISIT: 1 Visit                     Daymon Evans PT 02/08/2024  Acute Rehabilitation Services  Office [phone]

## 2024-02-08 NOTE — Plan of Care (Signed)
  Problem: Health Behavior/Discharge Planning: Goal: Ability to manage health-related needs will improve Outcome: Progressing   Problem: Clinical Measurements: Goal: Ability to maintain clinical measurements within normal limits will improve Outcome: Progressing Goal: Will remain free from infection Outcome: Progressing Goal: Diagnostic test results will improve Outcome: Progressing Goal: Respiratory complications will improve Outcome: Progressing Goal: Cardiovascular complication will be avoided Outcome: Progressing   Problem: Activity: Goal: Risk for activity intolerance will decrease Outcome: Progressing   Problem: Nutrition: Goal: Adequate nutrition will be maintained Outcome: Progressing   Problem: Coping: Goal: Level of anxiety will decrease Outcome: Progressing   Problem: Pain Managment: Goal: General experience of comfort will improve and/or be controlled Outcome: Progressing   Problem: Safety: Goal: Ability to remain free from injury will improve Outcome: Progressing   Problem: Skin Integrity: Goal: Risk for impaired skin integrity will decrease Outcome: Progressing   Problem: Education: Goal: Knowledge of the prescribed therapeutic regimen will improve Outcome: Progressing Goal: Understanding of discharge needs will improve Outcome: Progressing   Problem: Activity: Goal: Ability to avoid complications of mobility impairment will improve Outcome: Progressing Goal: Ability to tolerate increased activity will improve Outcome: Progressing   Problem: Clinical Measurements: Goal: Postoperative complications will be avoided or minimized Outcome: Progressing   Problem: Pain Management: Goal: Pain level will decrease with appropriate interventions Outcome: Progressing   Problem: Skin Integrity: Goal: Will show signs of wound healing Outcome: Progressing

## 2024-02-08 NOTE — Progress Notes (Signed)
 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 - Taken to the OR on 6/18 with ORIF findings showing right hip purulent fluid.  Cultures sent. - Patient has been on amoxicillin  prior to hospitalization.  Noted Synovasure positive for E faecalis.   Recommendations:  - Continue daptomycin. D/C uansyn. Start ceftriaxone. PR Cx NG x 3 day. Plan on 6 weeks of IV abx then will need po suppression with Augmentin x 6 months to cover e faecalis -PICC in place -Communicated abx plan to primary -ID will sign off   OPAT ORDERS:  Diagnosis: right hip pji  Culture Result:   Allergies  Allergen Reactions   Butorphanol Hives and Itching   Other Other (See Comments)    Seasonal allergies     Discharge antibiotics to be given via PICC line:  Per pharmacy protocol Daptomycin 800 mg IV every 24 hours + Ceftriaxone 2 gm IV Q 24 hours    Duration: 6 weeks End Date: 03/18/24  [facility] Care Per Protocol with [employer] Use: Home health RN for IV administration and teaching, line care and labs.    Labs weekly while on IV antibiotics: x__ CBC with differential __ BMP **TWICE WEEKLY ON VANCOMYCIN  __x CMP __x CRP __x ESR __ Vancomycin trough TWICE WEEKLY __ CK  __ Please pull PIC at completion of IV antibiotics __ xPlease leave PIC in place until doctor has seen patient or been notified  Fax weekly labs to [phone]  Clinic Follow Up Appt: 7/10  @ [facility] with Dr. Zelda Hickman   Evaluation of this patient requires complex antimicrobial therapy evaluation and counseling + isolation needs for disease transmission risk assessment and mitigation    Microbiology:   Antibiotics: Amox 6/17- Cefazollin 6/170  Vancomycin 6/17-   Cultures:   Other   OR culture 6/17 no growth  SUBJECTIVE: Resting in chair.  Interval: Afebrile overnight.   Review of Systems: Review of Systems  All other systems reviewed and are negative.    Scheduled Meds:  aspirin  81 mg Oral BID   Chlorhexidine  Gluconate Cloth  6 each Topical Daily   docusate sodium   100 mg Oral BID   ipratropium  1 spray Each Nare BID   liothyronine  5 mcg Oral Daily   loratadine   10 mg Oral Daily   oxyCODONE   10 mg Oral Q12H   pantoprazole   40 mg Oral Daily   sertraline   200 mg Oral Daily   sodium chloride  flush  10-40 mL Intracatheter Q12H   Continuous Infusions:  cefTRIAXone (ROCEPHIN)  IV 2 g (02/08/24 1226)   DAPTOmycin 800 mg (02/08/24 1329)   PRN Meds:.acetaminophen , albuterol , alum & mag hydroxide-simeth, artificial tears, bisacodyl, diphenhydrAMINE , HYDROmorphone  (DILAUDID ) injection, HYDROmorphone , magnesium  citrate, menthol -cetylpyridinium **OR** phenol, methocarbamol  **OR** methocarbamol  (ROBAXIN ) injection, metoCLOPramide **OR** metoCLOPramide (REGLAN) injection, metoprolol  tartrate, ondansetron  **OR** ondansetron  (ZOFRAN ) IV, oxyCODONE , polyethylene glycol, sodium chloride  flush, traZODone Allergies  Allergen Reactions   Butorphanol Hives and Itching   Other Other (See Comments)    Seasonal allergies    OBJECTIVE: Vitals:   02/07/24 1351 02/07/24 2013 02/08/24 0633 02/08/24 1329  BP:  109/64 (!) 109/58 (!) 105/59 109/67  Pulse: 92 85 84 (!) 101  Resp: 18 17  17   Temp: 98.2 F (36.8 C) 98.5 F (36.9 C) 98.3 F (36.8 C) 97.7 F (36.5 C)  TempSrc: Oral Oral Oral   SpO2: 97% 98% 96% 100%  Weight:      Height:       Body mass index is 38.85 kg/m.  Physical Exam Constitutional:      Appearance: Normal appearance.  HENT:     Head: Normocephalic and atraumatic.     Right Ear: Tympanic membrane normal.     Left Ear: Tympanic membrane  normal.     Nose: Nose normal.     Mouth/Throat:     Mouth: Mucous membranes are moist.   Eyes:     Extraocular Movements: Extraocular movements intact.     Conjunctiva/sclera: Conjunctivae normal.     Pupils: Pupils are equal, round, and reactive to light.    Cardiovascular:     Rate and Rhythm: Normal rate and regular rhythm.     Heart sounds: No murmur heard.    No friction rub. No gallop.  Pulmonary:     Effort: Pulmonary effort is normal.     Breath sounds: Normal breath sounds.  Abdominal:     General: Abdomen is flat.     Palpations: Abdomen is soft.   Musculoskeletal:     Comments: Right hip vac   Skin:    General: Skin is warm and dry.   Neurological:     General: No focal deficit present.     Mental Status: She is alert and oriented to person, place, and time.   Psychiatric:        Mood and Affect: Mood normal.       Lab Results Lab Results  Component Value Date   WBC 9.5 02/07/2024   HGB 8.9 (L) 02/07/2024   HCT 29.8 (L) 02/07/2024   MCV 91.4 02/07/2024   PLT 248 02/07/2024    Lab Results  Component Value Date   CREATININE 0.66 02/06/2024   BUN 19 02/06/2024   NA 136 02/06/2024   K 4.8 02/06/2024   CL 108 02/06/2024   CO2 24 02/06/2024    Lab Results  Component Value Date   ALT 43 11/13/2023   AST 43 (H) 11/13/2023   ALKPHOS 154 (H) 11/13/2023   BILITOT 0.2 11/13/2023        Orlie Bjornstad, MD [facility] 02/08/2024, 2:48 PM

## 2024-02-08 NOTE — Plan of Care (Signed)
  Problem: Clinical Measurements: Goal: Ability to maintain clinical measurements within normal limits will improve Outcome: Progressing Goal: Will remain free from infection Outcome: Progressing Goal: Diagnostic test results will improve Outcome: Progressing Goal: Respiratory complications will improve Outcome: Progressing Goal: Cardiovascular complication will be avoided Outcome: Progressing   Problem: Activity: Goal: Risk for activity intolerance will decrease Outcome: Progressing   Problem: Coping: Goal: Level of anxiety will decrease Outcome: Progressing   Problem: Pain Managment: Goal: General experience of comfort will improve and/or be controlled Outcome: Progressing

## 2024-02-08 NOTE — Progress Notes (Signed)
 PHARMACY CONSULT NOTE FOR:  OUTPATIENT  PARENTERAL ANTIBIOTIC THERAPY (OPAT)  Indication: Right Hip PJI  Regimen: Daptomycin 800 mg IV every 24 hours + Ceftriaxone 2 gm IV Q 24 hours  End date: 03/18/24  IV antibiotic discharge orders are pended. To discharging provider:  please sign these orders via discharge navigator,  Select New Orders & click on the button choice - Manage This Unsigned Work.     Thank you for allowing pharmacy to be a part of this patient's care.  Denson Flake, PharmD, BCPS, BCIDP Infectious Diseases Clinical Pharmacist Phone: [phone] 02/08/2024, 10:21 AM

## 2024-02-09 ENCOUNTER — Other Ambulatory Visit: Payer: Self-pay

## 2024-02-09 MED ORDER — HYDROMORPHONE HCL 2 MG PO TABS: 2.0000 mg | ORAL_TABLET | Freq: Four times a day (QID) | ORAL | 0 refills | Status: DC | PRN

## 2024-02-09 MED ORDER — SODIUM CHLORIDE 0.9% FLUSH: 10.0000 mL | INTRAVENOUS | Status: DC | PRN

## 2024-02-09 MED ORDER — METHOCARBAMOL 750 MG PO TABS
750.0000 mg | ORAL_TABLET | Freq: Four times a day (QID) | ORAL | 0 refills | Status: AC | PRN
Start: 2024-02-09 — End: ?

## 2024-02-09 NOTE — Progress Notes (Signed)
 [facility]edic [facility] Service (OTS)  4 Days Post-Op Procedure(s) (LRB): REVISION, TOTAL ARTHROPLASTY, HIP, ANTERIOR APPROACH (Right)  Subjective: Patient reports pain as mild.  Eager for discharge.  Objective: Current Vitals Blood pressure 112/71, pulse 92, temperature 98.2 F (36.8 C), temperature source Oral, resp. rate 17, height 5' 7.5 (1.715 m), weight 114.2 kg, last menstrual period 04/05/2023, SpO2 99%, unknown if currently breastfeeding. Vital signs in last 24 hours: Temp:  [98.2 F (36.8 C)-98.5 F (36.9 C)] 98.2 F (36.8 C) (06/21 0458) Pulse Rate:  [92-99] 92 (06/21 0458) Resp:  [17] 17 (06/21 0458) BP: (103-112)/(64-71) 112/71 (06/21 0458) SpO2:  [99 %] 99 % (06/21 0458)  Intake/Output from previous day: 06/20 0701 - 06/21 0700 In: 1826 [P.O.:1660; IV Piggyback:166] Out: 1350 [Urine:1200; Drains:150]  LABS Recent Labs    02/07/24 0344  HGB 8.9*   Recent Labs    02/07/24 0344  WBC 9.5  RBC 3.26*  HCT 29.8*  PLT 248   No results for input(s): NA, K, CL, CO2, BUN, CREATININE, GLUCOSE, CALCIUM  in the last 72 hours. No results for input(s): LABPT, INR in the last 72 hours.   Physical Exam LLE Dressing intact, clean, dry, pristine and holding seal  Edema/ swelling controlled  Sens: DPN, SPN, TN intact  Motor: EHL, FHL, and lessor toe ext and flex all intact grossly  Brisk cap refill, warm to touch, zero edema  Assessment/Plan: 4 Days Post-Op Procedure(s) (LRB): REVISION, TOTAL ARTHROPLASTY, HIP, ANTERIOR APPROACH (Right) 1. PT/OT  2. DVT proph baby ASA 3. Ceftriaxone  and Daptomycin , PICC done today 4.  F/u 8-14 days w Dr. Beverley Ozell Bruch, MD [facility], [facility] [phone]

## 2024-02-09 NOTE — Progress Notes (Signed)
 Peripherally Inserted Central Catheter Placement  The IV Nurse has discussed with the patient and/or persons authorized to consent for the patient, the purpose of this procedure and the potential benefits and risks involved with this procedure.  The benefits include less needle sticks, lab draws from the catheter, and the patient may be discharged home with the catheter. Risks include, but not limited to, infection, bleeding, blood clot (thrombus formation), and puncture of an artery; nerve damage and irregular heartbeat and possibility to perform a PICC exchange if needed/ordered by physician.  Alternatives to this procedure were also discussed.  Bard Power PICC patient education guide, fact sheet on infection prevention and patient information card has been provided to patient /or left at bedside.    PICC Placement Documentation  PICC Single Lumen 02/09/24 Right Cephalic 34 cm 0 cm (Active)  Indication for Insertion or Continuance of Line Home intravenous therapies (PICC only) 02/09/24 1246  Exposed Catheter (cm) 0 cm 02/09/24 1246  Site Assessment Clean, Dry, Intact 02/09/24 1246  Line Status Flushed;Saline locked;Blood return noted 02/09/24 1246  Dressing Type Transparent;Securing device 02/09/24 1246  Dressing Status Antimicrobial disc/dressing in place;Clean, Dry, Intact 02/09/24 1246  Line Care Connections checked and tightened 02/09/24 1246  Line Adjustment (NICU/IV Team Only) No 02/09/24 1246  Dressing Intervention New dressing;Adhesive placed at insertion site (IV team only);Adhesive placed around edges of dressing (IV team/ICU RN only) 02/09/24 1246  Dressing Change Due 02/16/24 02/09/24 1246       Sarah Saunders 02/09/2024, 12:47 PM

## 2024-02-09 NOTE — Progress Notes (Signed)
 Physical Therapy Treatment Patient Details Name: Sarah Saunders MRN: [id number] DOB: July 21, 1976 Today's Date: 02/09/2024   History of Present Illness 48 yo female s/p R THA revision 02/05/24. No formal precautions. Hx of anemia, L THA, obesity, anxiety, SVT    PT Comments  Pt up ad lib in room on arrival.  Pt ambulated in hallway and practiced one step technique.  Pt reports feeling better today and awaiting PICC line to d/c home.     If plan is discharge home, recommend the following: A little help with walking and/or transfers;A little help with bathing/dressing/bathroom;Assistance with cooking/housework;Assist for transportation;Help with stairs or ramp for entrance   Can travel by private vehicle        Equipment Recommendations       Recommendations for Other Services       Precautions / Restrictions Precautions Precautions: Fall Recall of Precautions/Restrictions: Intact Precaution/Restrictions Comments: NPWT Restrictions RLE Weight Bearing Per Provider Order: Weight bearing as tolerated     Mobility  Bed Mobility Overal bed mobility: Modified Independent                  Transfers Overall transfer level: Modified independent                      Ambulation/Gait Ambulation/Gait assistance: Modified independent (Device/Increase time) Gait Distance (Feet): 500 Feet Assistive device: Rolling walker (2 wheels) Gait Pattern/deviations: Step-through pattern, Decreased stride length Gait velocity: decr     General Gait Details: steady, no loss of balance, pt focusing on not having antalgic gait pattern   Stairs Stairs: Yes Stairs assistance: Contact guard assist Stair Management: Step to pattern, With walker, Forwards Number of Stairs: 1 General stair comments: verbal cues for sequence, safety, RW positioning; performed twice and pt reports understanding   Wheelchair Mobility     Tilt Bed    Modified Rankin (Stroke Patients Only)        Balance                                            Communication Communication Communication: No apparent difficulties  Cognition Arousal: Alert Behavior During Therapy: WFL for tasks assessed/performed   PT - Cognitive impairments: No apparent impairments                         Following commands: Intact      Cueing Cueing Techniques: Verbal cues  Exercises      General Comments        Pertinent Vitals/Pain Pain Assessment Pain Assessment: 0-10 Pain Score: 6  Pain Location: R hip Pain Descriptors / Indicators: Sore Pain Intervention(s): Repositioned, Monitored during session    Home Living                          Prior Function            PT Goals (current goals can now be found in the care plan section) Progress towards PT goals: Progressing toward goals    Frequency    7X/week      PT Plan      Co-evaluation              AM-PAC PT 6 Clicks Mobility   Outcome Measure  Help needed turning from your back to your side  while in a flat bed without using bedrails?: None Help needed moving from lying on your back to sitting on the side of a flat bed without using bedrails?: None Help needed moving to and from a bed to a chair (including a wheelchair)?: None Help needed standing up from a chair using your arms (e.g., wheelchair or bedside chair)?: None Help needed to walk in hospital room?: None Help needed climbing 3-5 steps with a railing? : A Little 6 Click Score: 23    End of Session Equipment Utilized During Treatment: Gait belt Activity Tolerance: Patient tolerated treatment well Patient left: in chair;with call bell/phone within reach   PT Visit Diagnosis: Difficulty in walking, not elsewhere classified (R26.2)     Time: 8958-8943 PT Time Calculation (min) (ACUTE ONLY): 15 min  Charges:    $Gait Training: 8-22 mins PT General Charges $$ ACUTE PT VISIT: 1 Visit                     Sarah Saunders, DPT Physical Therapist Acute Rehabilitation Services Office: [phone]    Sarah Saunders 02/09/2024, 2:50 PM

## 2024-02-10 LAB — AEROBIC/ANAEROBIC CULTURE W GRAM STAIN (SURGICAL/DEEP WOUND)
Culture: NO GROWTH
Gram Stain: NONE SEEN

## 2024-02-13 ENCOUNTER — Inpatient Hospital Stay: Admitting: Hematology

## 2024-02-13 ENCOUNTER — Inpatient Hospital Stay: Attending: Hematology

## 2024-02-13 VITALS — BP 115/62 | HR 90 | Temp 97.2°F | Resp 20 | Wt 256.2 lb

## 2024-02-13 DIAGNOSIS — D509 Iron deficiency anemia, unspecified: Secondary | ICD-10-CM | POA: Insufficient documentation

## 2024-02-13 DIAGNOSIS — E538 Deficiency of other specified B group vitamins: Secondary | ICD-10-CM | POA: Diagnosis not present

## 2024-02-13 DIAGNOSIS — N96 Recurrent pregnancy loss: Secondary | ICD-10-CM | POA: Diagnosis not present

## 2024-02-13 LAB — CMP (CANCER CENTER ONLY)
ALT: 65 U/L — ABNORMAL HIGH (ref 0–44)
AST: 39 U/L (ref 15–41)
Albumin: 3.7 g/dL (ref 3.5–5.0)
Alkaline Phosphatase: 145 U/L — ABNORMAL HIGH (ref 38–126)
Anion gap: 6 (ref 5–15)
BUN: 19 mg/dL (ref 6–20)
CO2: 28 mmol/L (ref 22–32)
Calcium: 9.4 mg/dL (ref 8.9–10.3)
Chloride: 106 mmol/L (ref 98–111)
Creatinine: 0.92 mg/dL (ref 0.44–1.00)
GFR, Estimated: >60 mL/min (ref >60)
Glucose, Bld: 104 mg/dL — ABNORMAL HIGH (ref 70–99)
Potassium: 4.3 mmol/L (ref 3.5–5.1)
Sodium: 140 mmol/L (ref 135–145)
Total Bilirubin: 0.2 mg/dL (ref 0.0–1.2)
Total Protein: 7.1 g/dL (ref 6.5–8.1)

## 2024-02-13 LAB — CBC WITH DIFFERENTIAL (CANCER CENTER ONLY)
Abs Immature Granulocytes: 0.03 10*3/uL (ref 0.00–0.07)
Basophils Absolute: 0 10*3/uL (ref 0.0–0.1)
Basophils Relative: 0 %
Eosinophils Absolute: 0.2 10*3/uL (ref 0.0–0.5)
Eosinophils Relative: 3 %
HCT: 31.2 % — ABNORMAL LOW (ref 36.0–46.0)
Hemoglobin: 9.8 g/dL — ABNORMAL LOW (ref 12.0–15.0)
Immature Granulocytes: 1 %
Lymphocytes Relative: 26 %
Lymphs Abs: 1.6 10*3/uL (ref 0.7–4.0)
MCH: 27.5 pg (ref 26.0–34.0)
MCHC: 31.4 g/dL (ref 30.0–36.0)
MCV: 87.6 fL (ref 80.0–100.0)
Monocytes Absolute: 0.4 10*3/uL (ref 0.1–1.0)
Monocytes Relative: 6 %
Neutro Abs: 3.9 10*3/uL (ref 1.7–7.7)
Neutrophils Relative %: 64 %
Platelet Count: 247 10*3/uL (ref 150–400)
RBC: 3.56 MIL/uL — ABNORMAL LOW (ref 3.87–5.11)
RDW: 14.1 % (ref 11.5–15.5)
WBC Count: 6.1 10*3/uL (ref 4.0–10.5)
nRBC: 0 % (ref 0.0–0.2)

## 2024-02-13 LAB — IRON AND IRON BINDING CAPACITY (CC-WL,HP ONLY)
Iron: 79 ug/dL (ref 28–170)
Saturation Ratios: 27 % (ref 10.4–31.8)
TIBC: 291 ug/dL (ref 250–450)
UIBC: 212 ug/dL (ref 148–442)

## 2024-02-13 LAB — VITAMIN B12: Vitamin B-12: 487 pg/mL (ref 180–914)

## 2024-02-13 LAB — FERRITIN: Ferritin: 276 ng/mL (ref 11–307)

## 2024-02-13 NOTE — Progress Notes (Signed)
 HEMATOLOGY/ONCOLOGY CLINIC NOTE  Date of Service:.02/13/2024   Patient Care Team: Verdia Lombard, MD as PCP - General (Internal Medicine) Elmira Newman PARAS, MD as PCP - Cardiology (Cardiology) Inocencio Soyla Lunger, MD as PCP - Electrophysiology (Cardiology)  CHIEF COMPLAINTS/PURPOSE OF CONSULTATION:  Anemia  HISTORY OF PRESENTING ILLNESS: (12/13/2016)    Sarah Saunders is a wonderful 48 y.o. female who has been referred to us  by Dr .VERDIA LOMBARD, MD  for evaluation and management of iron  deficiency anemia not responsive to oral iron  therapy.    Patient has a history of prolactinoma, polycystic ovarian disease, morbid obesity, previous history of iron  deficiency, GERD on chronic PPI therapy who is undergoing evaluation for bariatric surgery with Dr. Wilfred Guan at [facility].   As part of her pre-bariatric surgery evaluation (plan for duodenal switch surgery) she was noted to have zinc deficiency, iron  deficiency. She notes that she has been on ferrous sulfate  1 tablet by mouth twice a day since December 2017 with no improvement in her hemoglobin or ferritin levels.     She notes that she has been on AcipHex for years. She notes no overt GI bleeding. Notes that she has had no menstrual losses for about 5 months and that her periods have been fairly irregular due to her PCOS.   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 Recent labs with her primary care physician  from 10/30/2016 showed a hemoglobin of 9.5 with an MCV of 77 elevated RDW of 18.5 with normal W BC count of 11.2k and platelets of 312k. Iron  studies were not available in referable information that she had an iron  saturation of about 8% earlier this year.   No other acute new symptoms at this time. She notes that her bariatric medicine team has told her that they cannot proceed for surgical planning until her anemia and iron  deficiency are  completely corrected.   She notes that she is on zinc and copper replacement as per her bariatric medicine team.   Has not had any overt evidence of B12 deficiency.   Currently he is taking ferrous sulfate  1 tablet by mouth twice a day with vitamin C without improvement in her anemia. Patient notes that she has never required previous blood transfusions or IV iron .   We discussed about the role of IV iron  replacement in details and the possible etiologies for her iron  deficiency anemia including poor absorption due to chronic PPI therapy. She has been on high doses of ibuprofen  for one acute pains and is also on SSRI and we discussed that this combination could significantly increase her risk of GI bleeding. She was recommended to discuss this with her primary care physician.  HISTORY OF PRESENTING ILLNESS: (09/18/2023) Sarah Saunders is a wonderful 48 y.o. female who has been referred to us  by CNM Orie Bonus for evaluation and management of Anemia. Hx of chronic anemia, bariatric surgery. She has had iron  infusions prior pregnancy.   Labs from 07/15/2023 showed patient is anemic with hgb of 9.9 g/dL with hct of 66%. No recent iron  labs.  Patient was previously seen by us  in 12/13/2016 for iron  deficiency anemia. She had IV Injectafer  750 mg weekly for 2 dose for surgeries. She was recommended to follow-up with us  8 weeks after initial visit. Patient did not follow-up.  Patient is currently 26 weeks 4d pregnant. She reports this is her 5th pregnancy. Still birth at 85 week  pregnancy in 2023, unsure of the cause. G5P1. Patient notes she has been doing well overall since our last visit. She had her bariatric surgery and she follows-up with her PCP for Vitamin B-12 levels. She had lost around 170 lbs before her 2023 pregnancy.   Current medications: 500 mg Vitamin B-12 sublingually, pre-natal vitamin, and Vitamin D-3 supplement. NU Iron  once daily, started by OGYN around one month ago. She has  been tolerating NU Iron  well without any toxicities. Denies calcium  supplement.   She complains of significant fatigue and chronic hip pain. She also complains of sleep depravation. She occasionally takes Benadryl  to help her sleep.   She denies any new infection issues, fever, chills, night sweats, unexpected weight loss, back pain, chest pain, SOB, abdominal pain, or leg swelling.   Patient notes she has been eating well, gained around 13-14 lbs.   FmHx of anemia - mother. Denies any FmHx of blood disorders.   She has been anemic since she was 18. She denies any PmHx of blood disorder.   She has PmHx of kidney stones. She currently has occasional microscopic blood. She currently has 2 kidney stones in her right kidney.   She denies any IV Iron  infusions since 2018.   INTERVAL HISTORY:  Sarah Saunders is a wonderful 48 y.o. female who is here for continued evaluation and management of iron -deficiency anemia. She was last seen by me on 11/13/2023 and complained of sleep difficulties due to depression and bilateral hip pain, and mild abdominal pain.   Patient presents with a walker during today's visit.   Patient reports having emergency C-section February 28th, 2025 due to a placental abruption at [redacted] weeks gestation. She reports some emotional distress due to loss of pregnancy and is managing with counseling for emotional wellness. Patient notes that her placenta was sent for analysis and there were no findings of a clotting disorder.  Patient denies any hx of blood clots and denies any immediate family with blood clotting history. However, she notes that her second cousin had a blood clot issue. She does state that she has a long Fhx of hypertension and possibly heart disease.   Patient reports having initial hip surgery on May 29th, but notes that she needed to have repeat hip surgery on June 18th. She notes that she tolerated IV iron  well prior to her surgery.   She reports having  seroma infection and is on antibiotics. Patient notes that her infection was painful but has significantly improved and is now able to walk. Patient continues to work with outpatient physical therapy. She is able to walk 10 minutes per hour.   Patient reports that she is taking sublingual B12 and acid suppressants.  She notes that her menstrual periods have not returned yet.   Patient complains of hot flashes.   She reports having a previous miscarriage in 2023 at [redacted] weeks gestation with no known cause. Patient notes that her OB/GYN has told her that there may be a role to induce her delivery with future pregnancies sooner.   Her blood pressure in clinic is noted to be 115/62. Patient notes that her blood pressures are generally measured to be low.   MEDICAL HISTORY:  Past Medical History:  Diagnosis Date   Allergic rhinitis 02/27/2008   Qualifier: Diagnosis of   By: Krystal MD, Reyes LABOR     IMO SNOMED Dx Update Oct 2024     Anemia    iron  def   Antepartum fetal death  Anxiety    Arthritis    Genital herpes    GERD (gastroesophageal reflux disease)    History of bariatric surgery    History of kidney stones    History of left hip replacement    History of urinary stone    Migraines    Morbid obesity (HCC) 10/09/2016   Pituitary tumor    Polycystic ovary disease    Prolactinoma (HCC)    SVT (supraventricular tachycardia) (HCC)    Vitamin D deficiency     SURGICAL HISTORY: Past Surgical History:  Procedure Laterality Date   ANTERIOR HIP REVISION Right 02/05/2024   Procedure: REVISION, TOTAL ARTHROPLASTY, HIP, ANTERIOR APPROACH;  Surgeon: Beverley Evalene BIRCH, MD;  Location: [facility] ORS;  Service: Orthopedics;  Laterality: Right;   CESAREAN SECTION N/A 10/19/2023   Procedure: CESAREAN SECTION;  Surgeon: Herchel Gloris LABOR, MD;  Location: [facility] LD ORS;  Service: Obstetrics;  Laterality: N/A;   CYSTOSCOPY/URETEROSCOPY/HOLMIUM LASER/STENT PLACEMENT Right 07/06/2023   Procedure:  CYSTOSCOPY/RIGHT RETROGRADE URETEROSCOPY/HOLMIUM LASER/STENT PLACEMENT;  Surgeon: Selma Donnice SAUNDERS, MD;  Location: [facility]S;  Service: Urology;  Laterality: Right;   JOINT REPLACEMENT Left 01/13/2021   hip   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)     TUBAL LIGATION     right fallopian tube removed   tube removed      SOCIAL HISTORY: Social History   Socioeconomic History   Marital status: Married    Spouse name: Ozell   Number of children: 1   Years of education: Not on file   Highest education level: Not on file  Occupational History   Occupation: Charity fundraiser    Comment: [employer]  Tobacco Use   Smoking status: Never   Smokeless tobacco: Never  Vaping Use   Vaping status: Never Used  Substance and Sexual Activity   Alcohol  use: No   Drug use: No   Sexual activity: Yes    Birth control/protection: None  Other Topics Concern   Not on file  Social History Narrative   Lives with husband.   Social Drivers of Corporate investment banker Strain: Not on file  Food Insecurity: No Food Insecurity (02/05/2024)   Hunger Vital Sign    Worried About Running Out of Food in the Last Year: Never true    Ran Out of Food in the Last Year: Never true  Transportation Needs: No Transportation Needs (02/05/2024)   PRAPARE - Administrator, Civil Service (Medical): No    Lack of Transportation (Non-Medical): No  Physical Activity: Not on file  Stress: Not on file  Social Connections: Unknown (02/05/2024)   Social Connection and Isolation Panel    Frequency of Communication with Friends and Family: More than three times a week    Frequency of Social Gatherings with Friends and Family: Three times a week    Attends Religious Services: More than 4 times per year    Active Member of Clubs or Organizations: Patient declined    Attends Banker Meetings: Patient declined    Marital Status: Married  Catering manager Violence: Not At  Risk (02/05/2024)   Humiliation, Afraid, Rape, and Kick questionnaire    Fear of Current or Ex-Partner: No    Emotionally Abused: No    Physically Abused: No    Sexually Abused: No    FAMILY HISTORY: Family History  Problem Relation Age of Onset   Hypertension Father    Heart failure Father    Hypertension Mother  Heart failure Brother     ALLERGIES:  is allergic to butorphanol and other.  MEDICATIONS:  Current Outpatient Medications  Medication Sig Dispense Refill   acetaminophen  (TYLENOL ) 325 MG tablet Take 2 tablets (650 mg total) by mouth every 6 (six) hours. (Patient taking differently: Take 1,000 mg by mouth every 6 (six) hours as needed for mild pain (pain score 1-3) or moderate pain (pain score 4-6).) 30 tablet 0   albuterol  (PROVENTIL  HFA;VENTOLIN  HFA) 108 (90 BASE) MCG/ACT inhaler Inhale 2 puffs into the lungs every 4 (four) hours as needed for wheezing or shortness of breath.     aspirin  81 MG chewable tablet Chew 81 mg by mouth 2 (two) times daily. Swallow whole.     cefTRIAXone  (ROCEPHIN ) IVPB Inject 2 g into the vein daily. Indication:  Right Hip PJI  First Dose: Yes Last Day of Therapy:  03/18/24  Labs - Once weekly:  CBC/D and BMP, Labs - Once weekly: ESR and CRP Method of administration: IV Push Method of administration may be changed at the discretion of home infusion pharmacist based upon assessment of the patient and/or caregiver's ability to self-administer the medication ordered. 39 Units 0   Cetirizine HCl (ZYRTEC ALLERGY) 10 MG CAPS Take 10 mg by mouth in the morning.     Cholecalciferol (VITAMIN D3) 50 MCG (2000 UT) capsule Take 2,000 Units by mouth daily.     daptomycin  (CUBICIN ) IVPB Inject 800 mg into the vein daily. Indication:  Right Hip PJI  First Dose: Yes Last Day of Therapy:  03/18/24  Labs - Once weekly:  CBC/D, BMP, and CPK Labs - Once weekly: ESR and CRP Method of administration: IV Push Method of administration may be changed at the  discretion of home infusion pharmacist based upon assessment of the patient and/or caregiver's ability to self-administer the medication ordered. 39 Units 0   diclofenac (VOLTAREN) 75 MG EC tablet Take 75 mg by mouth 2 (two) times daily.     docusate sodium  (COLACE) 100 MG capsule Take 1 capsule (100 mg total) by mouth daily. (Patient taking differently: Take 200 mg by mouth 2 (two) times daily.) 10 capsule 0   HYDROmorphone  (DILAUDID ) 2 MG tablet Take 2 mg by mouth every 4 (four) hours as needed for severe pain (pain score 7-10).     HYDROmorphone  (DILAUDID ) 2 MG tablet Take 1 tablet (2 mg total) by mouth every 6 (six) hours as needed for severe pain (pain score 7-10). after hip surgery 30 tablet 0   ipratropium (ATROVENT ) 0.03 % nasal spray Place 1 spray into both nostrils in the morning and at bedtime.     liothyronine  (CYTOMEL ) 5 MCG tablet Take 5 mcg by mouth daily.     methocarbamol  (ROBAXIN ) 750 MG tablet Take 1 tablet (750 mg total) by mouth every 6 (six) hours as needed for muscle spasms. 60 tablet 0   metoprolol  tartrate (LOPRESSOR ) 25 MG tablet TAKE 0.5 TABLETS (12.5 MG TOTAL) BY MOUTH 2 (TWO) TIMES DAILY AS NEEDED (SVT FLARE UPS). 90 tablet 2   ondansetron  (ZOFRAN -ODT) 4 MG disintegrating tablet Take 4 mg by mouth every 8 (eight) hours as needed.     pantoprazole  (PROTONIX ) 40 MG tablet Take 40 mg by mouth daily. (Patient not taking: Reported on 01/28/2024)     Prenatal Vit-Fe Fumarate-FA (PRENATAL VITAMIN PO) Take 2 tablets by mouth daily.     RABEprazole (ACIPHEX) 20 MG tablet Take 20 mg by mouth daily.     sertraline  (ZOLOFT ) 100  MG tablet Take 200 mg by mouth in the morning.     traZODone  (DESYREL ) 50 MG tablet Take 100 mg by mouth at bedtime as needed.     No current facility-administered medications for this visit.    REVIEW OF SYSTEMS:    10 Point review of Systems was done is negative except as noted above.  PHYSICAL EXAMINATION: ECOG PERFORMANCE STATUS: 1 - Symptomatic but  completely ambulatory  . Vitals:   02/13/24 1330  BP: 115/62  Pulse: 90  Resp: 20  Temp: (!) 97.2 F (36.2 C)  SpO2: 98%     Filed Weights   02/13/24 1330  Weight: 256 lb 3.2 oz (116.2 kg)     .Body mass index is 39.53 kg/m. GENERAL:alert, in no acute distress and comfortable SKIN: no acute rashes, no significant lesions EYES: conjunctiva are pink and non-injected, sclera anicteric OROPHARYNX: MMM, no exudates, no oropharyngeal erythema or ulceration NECK: supple, no JVD LYMPH:  no palpable lymphadenopathy in the cervical, axillary or inguinal regions LUNGS: clear to auscultation b/l with normal respiratory effort HEART: regular rate & rhythm ABDOMEN:  normoactive bowel sounds , non tender, not distended. Extremity: no pedal edema PSYCH: alert & oriented x 3 with fluent speech NEURO: no focal motor/sensory deficits   LABORATORY DATA:  I have reviewed the data as listed  .    Latest Ref Rng & Units 02/13/2024    1:07 PM 02/07/2024    3:44 AM 02/06/2024    3:38 AM  CBC  WBC 4.0 - 10.5 K/uL 6.1  9.5  7.4   Hemoglobin 12.0 - 15.0 g/dL 9.8  8.9  9.7   Hematocrit 36.0 - 46.0 % 31.2  29.8  31.8   Platelets 150 - 400 K/uL 247  248  300     .    Latest Ref Rng & Units 02/13/2024    1:07 PM 02/06/2024    3:38 AM 01/28/2024   11:38 AM  CMP  Glucose 70 - 99 mg/dL 895  882  868   BUN 6 - 20 mg/dL 19  19  15    Creatinine 0.44 - 1.00 mg/dL 9.07  9.33  9.12   Sodium 135 - 145 mmol/L 140  136  137   Potassium 3.5 - 5.1 mmol/L 4.3  4.8  4.3   Chloride 98 - 111 mmol/L 106  108  106   CO2 22 - 32 mmol/L 28  24  23    Calcium  8.9 - 10.3 mg/dL 9.4  8.7  9.2   Total Protein 6.5 - 8.1 g/dL 7.1     Total Bilirubin 0.0 - 1.2 mg/dL 0.2     Alkaline Phos 38 - 126 U/L 145     AST 15 - 41 U/L 39     ALT 0 - 44 U/L 65      . Lab Results  Component Value Date   IRON  79 02/13/2024   TIBC 291 02/13/2024   IRONPCTSAT 27 02/13/2024   (Iron  and TIBC)  Lab Results  Component Value  Date   FERRITIN 276 02/13/2024   Component     Latest Ref Rng 09/18/2023  Folate, Hemolysate     Not Estab. ng/mL 460.0   HCT     34.0 - 46.6 % 30.6 (L)   Folate, RBC     >498 ng/mL 1,503   T4,Free(Direct)     0.61 - 1.12 ng/dL 9.30   TSH     9.649 - 4.500 uIU/mL 1.038  Vitamin B12     180 - 914 pg/mL 594     Legend: (L) Low  RADIOGRAPHIC STUDIES: I have personally reviewed the radiological images as listed and agreed with the findings in the report. US  EKG SITE RITE Result Date: 02/09/2024 If Site Rite image not attached, placement could not be confirmed due to current cardiac rhythm.  US  EKG SITE RITE Result Date: 02/09/2024 If Site Rite image not attached, placement could not be confirmed due to current cardiac rhythm.  DG CHEST PORT 1 VIEW Result Date: 02/08/2024 CLINICAL DATA:  PICC line EXAM: PORTABLE CHEST 1 VIEW COMPARISON:  Chest x-ray 09/14/2018 FINDINGS: Right upper extremity PICC terminates at the brachiocephalic SVC junction. The heart size and mediastinal contours are within normal limits. Both lungs are clear. The visualized skeletal structures are unremarkable. IMPRESSION: Right upper extremity PICC terminates at the brachiocephalic SVC junction. Electronically Signed   By: Greig Pique M.D.   On: 02/08/2024 18:03   US  EKG SITE RITE Result Date: 02/07/2024 If Site Rite image not attached, placement could not be confirmed due to current cardiac rhythm.  DG HIP UNILAT WITH PELVIS 1V RIGHT Result Date: 02/05/2024 CLINICAL DATA:  Elective surgery. EXAM: DG HIP (WITH OR WITHOUT PELVIS) 1V RIGHT COMPARISON:  None Available. FINDINGS: Two fluoroscopic spot views of the pelvis and right hip obtained in the operating room. Images during hip arthroplasty. Fluoroscopy time 1 second. Dose 0.31 mGy. Previous left hip arthroplasty. IMPRESSION: Intraoperative fluoroscopy during right hip arthroplasty. Electronically Signed   By: Andrea Gasman M.D.   On: 02/05/2024 18:04    DG C-Arm 1-60 Min-No Report Result Date: 02/05/2024 Fluoroscopy was utilized by the requesting physician.  No radiographic interpretation.   DG C-Arm 1-60 Min-No Report Result Date: 02/05/2024 Fluoroscopy was utilized by the requesting physician.  No radiographic interpretation.     ASSESSMENT & PLAN:   48 y.o. female with:  Anemia  B12 deficiency Recurrent miscarriages  PLAN:  -Discussed lab results on 02/13/24 in detail with patient. CBC showed WBC of 6.1K, hemoglobin of 9.8, and platelets of 247K.  -Hgb is improving -iron  saturation improving - noted to be 27% -ferritin noted to be improving - and today are @ 276 - no indication for additional IV Iron  at this time. -continue sublingual B12 vitamin  -recommended OTC Liposomal B-complex, one drop daily -Recommend to find reasoning for reoccurrence of miscarriages and ruling out hypercoagulation state. If placenta is found to have a clot, there would be the possibility of preventative dose of blood thinners for future pregnancies.  -will review her outside labs for hypercoagulable testing -She is agreeable to having additional hypercoagulable lab work up due to clotting concerns.  -we discussed that her hot flashes could be due to possible hormonal changes and surgery/anesthesia - answered all of patients questions in detail - will repeat labs in 4 months   FOLLOW-UP: RTC with Dr Onesimo with labs in 4 months  The total time spent in the appointment was 20 minutes* .  All of the patient's questions were answered with apparent satisfaction. The patient knows to call the clinic with any problems, questions or concerns.   Emaline Onesimo MD MS AAHIVMS [facility] Hematology/Oncology Physician [facility]  .*Total Encounter Time as defined by the Centers for Medicare and Medicaid Services includes, in addition to the face-to-face time of a patient visit (documented in the note above) non-face-to-face time: obtaining and  reviewing outside history, ordering and reviewing medications, tests or procedures,  care coordination (communications with other health care professionals or caregivers) and documentation in the medical record.    I,Mitra Faeizi,acting as a Neurosurgeon for Emaline Saran, MD.,have documented all relevant documentation on the behalf of Emaline Saran, MD,as directed by  Emaline Saran, MD while in the presence of Emaline Saran, MD.  .I have reviewed the above documentation for accuracy and completeness, and I agree with the above. .Husain Costabile Kishore Mikaila Grunert MD

## 2024-02-19 ENCOUNTER — Encounter: Payer: Self-pay | Admitting: Hematology

## 2024-02-20 ENCOUNTER — Telehealth: Payer: Self-pay

## 2024-02-20 NOTE — Telephone Encounter (Signed)
 Patient called stating she requested fluconazole  for yeast infections with her being on the antibiotics. She reports she typically get a yeast infection with antibiotics.  She is requesting prescription be sent to [facility] on [address].

## 2024-02-21 ENCOUNTER — Other Ambulatory Visit: Payer: Self-pay | Admitting: Internal Medicine

## 2024-02-21 MED ORDER — FLUCONAZOLE 150 MG PO TABS: 150.0000 mg | ORAL_TABLET | Freq: Once | ORAL | 0 refills | Status: AC

## 2024-02-21 NOTE — Progress Notes (Signed)
 diflucan

## 2024-02-24 NOTE — Progress Notes (Deleted)
  Cardiology Office Note:  .   Date:  02/24/2024  ID:  Sarah Saunders, DOB 18-Feb-1976, MRN [id number] PCP: Verdia Lombard, MD  [facility] Providers Cardiologist:  Newman JINNY Lawrence, MD Electrophysiologist:  Soyla Gladis Norton, MD {  History of Present Illness: .   Sarah Saunders is a 48 y.o. female w/PMHx of  PCOS, GERD Obesity (hx of bariatric surgery) Recurrent miscarriages IDA > follows with heme SVT (adenosine/vagal sensitive)  She saw Dr. Norton 11/20/23, with recurrent SVT and preference to avoid medications > planned for ablation  Scheduled for her ablation 01/25/24 though postponed to 8/26 given need for hip surgery 2/2 infected R hip prosthesis (Hip surgery done on 02/06/24)    Today's visit is scheduled as her post ablation visit ROS:   *** pt is an HD RN *** miscarriage Feb 2025 (as well as priors) *** ID?  When will she done with abx? *** SVT?   Studies Reviewed: [name]    EKG not done today   09/11/23: TTE 1. Left ventricular ejection fraction, by estimation, is 55 to 60%. Left  ventricular ejection fraction by 3D volume is 60 %. The left ventricle has  normal function. The left ventricle has no regional wall motion  abnormalities. Left ventricular diastolic   parameters were normal. The average left ventricular global longitudinal  strain is -20.4 %. The global longitudinal strain is normal.   2. Right ventricular systolic function is normal. The right ventricular  size is normal.   3. The mitral valve is normal in structure. No evidence of mitral valve  regurgitation. No evidence of mitral stenosis.   4. The aortic valve is tricuspid. Aortic valve regurgitation is not  visualized. No aortic stenosis is present.   5. Moderately dilated pulmonary artery.   6. The inferior vena cava is normal in size with greater than 50%  respiratory variability, suggesting right atrial pressure of 3 mmHg.    Risk Assessment/Calculations:    Physical  Exam:   VS:  LMP 04/05/2023    Wt Readings from Last 3 Encounters:  02/13/24 256 lb 3.2 oz (116.2 kg)  02/05/24 251 lb 12.3 oz (114.2 kg)  01/28/24 251 lb 12.8 oz (114.2 kg)    GEN: Well nourished, well developed in no acute distress NECK: No JVD; No carotid bruits CARDIAC: ***RRR, no murmurs, rubs, gallops RESPIRATORY:  *** CTA b/l without rales, wheezing or rhonchi  ABDOMEN: Soft, non-tender, non-distended EXTREMITIES: *** No edema; No deformity   ASSESSMENT AND PLAN: .    SVT ***     {Are you ordering a CV Procedure (e.g. stress test, cath, DCCV, TEE, etc)?   Press F2        :[id number]}     Dispo: ***  Signed, Charlies Macario Arthur, PA-C

## 2024-02-25 ENCOUNTER — Telehealth: Payer: Self-pay | Admitting: *Deleted

## 2024-02-25 NOTE — Telephone Encounter (Signed)
 Pt asking if she could move her ablation sooner as she is currently out of work post hip surgery.  On 6/17 she underwent a  REVISION, TOTAL ARTHROPLASTY, HIP, ANTERIOR APPROACH (Right: Hip)  States she is currently on IV abx until 7/29.  Aware that I am not sure we can move her procedure up while on IV abx.  Will forward to MD for advisement.  Pt aware I  will let her know what MD says, but aware that most likely cannot consider until IV abx is complete.  Patient agreeable to plan.

## 2024-02-25 NOTE — Telephone Encounter (Signed)
 When would you consider her fully recovered?  Once abx complete?  Please advise to determine if I may offer her 8/7 or 8/13 (move her up from 8/26).......Sarah Saunders

## 2024-02-26 ENCOUNTER — Ambulatory Visit: Admitting: Physician Assistant

## 2024-02-28 ENCOUNTER — Telehealth: Payer: Self-pay

## 2024-02-28 ENCOUNTER — Encounter: Payer: Self-pay | Admitting: Internal Medicine

## 2024-02-28 ENCOUNTER — Other Ambulatory Visit: Payer: Self-pay

## 2024-02-28 ENCOUNTER — Ambulatory Visit: Payer: Self-pay | Admitting: Internal Medicine

## 2024-02-28 ENCOUNTER — Other Ambulatory Visit (HOSPITAL_COMMUNITY)
Admission: RE | Admit: 2024-02-28 | Discharge: 2024-02-28 | Disposition: A | Discharge status: Home / Self Care | Source: Ambulatory Visit | Attending: Internal Medicine | Admitting: Internal Medicine

## 2024-02-28 VITALS — BP 101/71 | HR 88 | Temp 97.7°F | Ht 67.5 in | Wt 250.0 lb

## 2024-02-28 DIAGNOSIS — N760 Acute vaginitis: Secondary | ICD-10-CM

## 2024-02-28 DIAGNOSIS — T8451XD Infection and inflammatory reaction due to internal right hip prosthesis, subsequent encounter: Secondary | ICD-10-CM | POA: Diagnosis not present

## 2024-02-28 DIAGNOSIS — Z96641 Presence of right artificial hip joint: Secondary | ICD-10-CM

## 2024-02-28 DIAGNOSIS — B3731 Acute candidiasis of vulva and vagina: Secondary | ICD-10-CM

## 2024-02-28 MED ORDER — FLUCONAZOLE 150 MG PO TABS: 150.0000 mg | ORAL_TABLET | Freq: Every day | ORAL | 3 refills | Status: DC

## 2024-02-28 MED ORDER — AMOXICILLIN 500 MG PO CAPS: 500.0000 mg | ORAL_CAPSULE | Freq: Two times a day (BID) | ORAL | 5 refills | Status: DC

## 2024-02-28 NOTE — Telephone Encounter (Signed)
 Per Dr. Dennise end date for IV abx and pull picc is on 7/29. Sent a message to [facility] about orders as well.

## 2024-02-28 NOTE — Patient Instructions (Signed)
 Start amoxicillin  500 bid on 7/30 Take fluconazole  on day 1 then 72 hours later

## 2024-02-28 NOTE — Progress Notes (Signed)
 Patient Active Problem List   Diagnosis Date Noted   History of revision of total replacement of right hip joint 02/05/2024   ABLA (acute blood loss anemia) 10/20/2023   Pregnancy 10/19/2023   S/P C-section 10/19/2023   AMA (advanced maternal age) multigravida 35+, third trimester 10/19/2023   Decreased fetal movement affecting management of pregnancy in third trimester, fetus 1 10/19/2023   Placental abruption in third trimester 10/19/2023   Short cervix affecting pregnancy 09/25/2023   Kidney stone complicating pregnancy 07/05/2023   IUFD at 20 weeks or more of gestation 06/19/2022   PSVT (paroxysmal supraventricular tachycardia) (HCC) 05/26/2019   Iron  deficiency anemia 12/13/2016   Morbid obesity (HCC) 10/09/2016   Tachycardia 01/08/2012   Benign neoplasm of skin 03/06/2008   POLYCYSTIC OVARIAN DISEASE 02/27/2008   Overweight 02/27/2008    Patient's Medications  New Prescriptions   No medications on file  Previous Medications   ACETAMINOPHEN  (TYLENOL ) 325 MG TABLET    Take 2 tablets (650 mg total) by mouth every 6 (six) hours.   ALBUTEROL  (PROVENTIL  HFA;VENTOLIN  HFA) 108 (90 BASE) MCG/ACT INHALER    Inhale 2 puffs into the lungs every 4 (four) hours as needed for wheezing or shortness of breath.   ASPIRIN  81 MG CHEWABLE TABLET    Chew 81 mg by mouth 2 (two) times daily. Swallow whole.   CEFTRIAXONE  (ROCEPHIN ) IVPB    Inject 2 g into the vein daily. Indication:  Right Hip PJI  First Dose: Yes Last Day of Therapy:  03/18/24  Labs - Once weekly:  CBC/D and BMP, Labs - Once weekly: ESR and CRP Method of administration: IV Push Method of administration may be changed at the discretion of home infusion pharmacist based upon assessment of the patient and/or caregiver's ability to self-administer the medication ordered.   CETIRIZINE HCL (ZYRTEC ALLERGY) 10 MG CAPS    Take 10 mg by mouth in the morning.   CHOLECALCIFEROL (VITAMIN D3) 50 MCG (2000 UT) CAPSULE    Take  2,000 Units by mouth daily.   DAPTOMYCIN  (CUBICIN ) IVPB    Inject 800 mg into the vein daily. Indication:  Right Hip PJI  First Dose: Yes Last Day of Therapy:  03/18/24  Labs - Once weekly:  CBC/D, BMP, and CPK Labs - Once weekly: ESR and CRP Method of administration: IV Push Method of administration may be changed at the discretion of home infusion pharmacist based upon assessment of the patient and/or caregiver's ability to self-administer the medication ordered.   DICLOFENAC (VOLTAREN) 75 MG EC TABLET    Take 75 mg by mouth 2 (two) times daily.   DOCUSATE SODIUM  (COLACE) 100 MG CAPSULE    Take 1 capsule (100 mg total) by mouth daily.   HYDROMORPHONE  (DILAUDID ) 2 MG TABLET    Take 2 mg by mouth every 4 (four) hours as needed for severe pain (pain score 7-10).   HYDROMORPHONE  (DILAUDID ) 2 MG TABLET    Take 1 tablet (2 mg total) by mouth every 6 (six) hours as needed for severe pain (pain score 7-10). after hip surgery   IPRATROPIUM (ATROVENT ) 0.03 % NASAL SPRAY    Place 1 spray into both nostrils in the morning and at bedtime.   LIOTHYRONINE  (CYTOMEL ) 5 MCG TABLET    Take 5 mcg by mouth daily.   METHOCARBAMOL  (ROBAXIN ) 750 MG TABLET    Take 1 tablet (750 mg total) by mouth every 6 (six) hours as needed for  muscle spasms.   METOPROLOL  TARTRATE (LOPRESSOR ) 25 MG TABLET    TAKE 0.5 TABLETS (12.5 MG TOTAL) BY MOUTH 2 (TWO) TIMES DAILY AS NEEDED (SVT FLARE UPS).   ONDANSETRON  (ZOFRAN -ODT) 4 MG DISINTEGRATING TABLET    Take 4 mg by mouth every 8 (eight) hours as needed.   PANTOPRAZOLE  (PROTONIX ) 40 MG TABLET    Take 40 mg by mouth daily.   PRENATAL VIT-FE FUMARATE-FA (PRENATAL VITAMIN PO)    Take 2 tablets by mouth daily.   RABEPRAZOLE (ACIPHEX) 20 MG TABLET    Take 20 mg by mouth daily.   SERTRALINE  (ZOLOFT ) 100 MG TABLET    Take 200 mg by mouth in the morning.   TRAZODONE  (DESYREL ) 50 MG TABLET    Take 100 mg by mouth at bedtime as needed.  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  Patient taken to the OR on 6/18 for ORIF finding showing right hip purulent fluid.  Patient-amoxicillin  prior to OR.  Noted that this Synovasure in clinic was positive for E faecalis.  She was discharged on daptomycin  and ceftriaxone  x 6 weeks EOT 7/29 then plan for amoxicillin  for p.o. suppression to cover E faecalis.Reprots  she thinks she has a yeast infection with itching on abx.  Review of Systems: Review of Systems  All other systems reviewed and are negative.   Past Medical History:  Diagnosis Date   Allergic rhinitis 02/27/2008   Qualifier: Diagnosis of   By: Krystal MD, Reyes LABOR     IMO SNOMED Dx Update Oct 2024     Anemia    iron  def   Antepartum fetal death    Anxiety    Arthritis    Genital herpes    GERD (gastroesophageal reflux disease)    History of bariatric surgery    History of kidney stones    History of left hip replacement    History of urinary stone    Migraines    Morbid obesity (HCC) 10/09/2016   Pituitary tumor    Polycystic ovary disease    Prolactinoma (HCC)    SVT (supraventricular tachycardia) (HCC)    Vitamin D deficiency     Social History   Tobacco Use   Smoking status: Never   Smokeless tobacco: Never  Vaping Use   Vaping status: Never Used  Substance Use Topics   Alcohol  use: No   Drug use: No    Family History  Problem Relation Age of Onset   Hypertension Father    Heart failure Father    Hypertension Mother    Heart failure Brother     Allergies  Allergen Reactions   Butorphanol Hives and Itching   Other Other (See Comments)    Seasonal allergies    Health Maintenance  Topic Date Due   Hepatitis B Vaccines (1 of 3 - 19+ 3-dose series) Never done   Cervical  Cancer Screening (HPV/Pap Cotest)  Never done   Colonoscopy  Never done   COVID-19 Vaccine (3 - 2024-25 season) 04/22/2023   INFLUENZA VACCINE  03/21/2024   DTaP/Tdap/Td (3 - Td or Tdap) 11/14/2031   Hepatitis C Screening  Completed   HIV Screening  Completed   HPV VACCINES  Aged Out   Meningococcal B  Vaccine  Aged Out    Objective:  Vitals:   02/28/24 0914  BP: 101/71  Pulse: 88  Temp: 97.7 F (36.5 C)  TempSrc: Temporal  SpO2: 98%  Weight: 250 lb (113.4 kg)  Height: 5' 7.5 (1.715 m)   Body mass index is 38.58 kg/m.  Physical Exam Constitutional:      Appearance: Normal appearance.  HENT:     Head: Normocephalic and atraumatic.     Right Ear: Tympanic membrane normal.     Left Ear: Tympanic membrane normal.     Nose: Nose normal.     Mouth/Throat:     Mouth: Mucous membranes are moist.  Eyes:     Extraocular Movements: Extraocular movements intact.     Conjunctiva/sclera: Conjunctivae normal.     Pupils: Pupils are equal, round, and reactive to light.  Cardiovascular:     Rate and Rhythm: Normal rate and regular rhythm.     Heart sounds: No murmur heard.    No friction rub. No gallop.  Pulmonary:     Effort: Pulmonary effort is normal.     Breath sounds: Normal breath sounds.  Abdominal:     General: Abdomen is flat.     Palpations: Abdomen is soft.  Musculoskeletal:        General: Normal range of motion.  Skin:    General: Skin is warm and dry.  Neurological:     General: No focal deficit present.     Mental Status: She is alert and oriented to person, place, and time.  Psychiatric:        Mood and Affect: Mood normal.    Physical Exam   Lab Results Lab Results  Component Value Date   WBC 6.1 02/13/2024   HGB 9.8 (L) 02/13/2024   HCT 31.2 (L) 02/13/2024   MCV 87.6 02/13/2024   PLT 247 02/13/2024    Lab Results  Component Value Date   CREATININE 0.92 02/13/2024   BUN 19 02/13/2024   NA 140 02/13/2024   K 4.3 02/13/2024   CL 106  02/13/2024   CO2 28 02/13/2024    Lab Results  Component Value Date   ALT 65 (H) 02/13/2024   AST 39 02/13/2024   ALKPHOS 145 (H) 02/13/2024   BILITOT 0.2 02/13/2024    Lab Results  Component Value Date   CHOL 136 02/27/2008   HDL 40.0 02/27/2008   LDLCALC 85 02/27/2008   TRIG 55 02/27/2008   CHOLHDL 3.4 CALC 02/27/2008   Lab Results  Component Value Date   LABRPR NON REACTIVE 10/19/2023   No results found for: HIV1RNAQUANT, HIV1RNAVL, CD4TABS   Problem List Items Addressed This Visit   None  Results   Assessment/Plan 48 year old female presents for hospital follow-up #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  Patient was on [place] prior to hospitalization.  At clinic her Ryland was positive E faecalis.  Blood cultures no growth. - Discharged on daptomycin  and ceftriaxone 's x 6 weeks EOT 7/29 then presentation to Amoxil  500 mg p.o. twice daily.  #Medication management #PICC line 7/8 WBC 5.2 SCr 0.68, CK 117,CRP 2. LFT 202/195/287 alp/ ast/alt on 7/8 145/39/65 on 6/25 - pull picc after last dose of antibiotics.   #Candida vaginitis -fluconazole  -testing   Loney Stank, MD [facility] 02/28/2024, 9:22 AM   I have personally spent 51 minutes involved in face-to-face and non-face-to-face activities for  this patient on the day of the visit. Professional time spent includes the following activities: Preparing to see the patient (review of tests), Obtaining and/or reviewing separately obtained history (admission/discharge record), Performing a medically appropriate examination and/or evaluation , Ordering medications/tests/procedures, referring and communicating with other health care professionals, Documenting clinical information in the EMR, Independently interpreting results (not separately reported), Communicating results to the  patient/family/caregiver, Counseling and educating the patient/family/caregiver and Care coordination (not separately reported).

## 2024-02-29 LAB — CERVICOVAGINAL ANCILLARY ONLY
Bacterial Vaginitis (gardnerella): NEGATIVE
Candida Glabrata: NEGATIVE
Candida Vaginitis: NEGATIVE
Chlamydia: NEGATIVE
Comment: NEGATIVE
Comment: NEGATIVE
Comment: NEGATIVE
Comment: NEGATIVE
Comment: NEGATIVE
Comment: NORMAL
Neisseria Gonorrhea: NEGATIVE
Trichomonas: NEGATIVE

## 2024-02-29 NOTE — Telephone Encounter (Signed)
 Can move up procedure, per Dr. Inocencio.  Will forward to EP procedure scheduler to arrange if sooner date still available (cannot be before 7/29)

## 2024-03-05 ENCOUNTER — Emergency Department (HOSPITAL_COMMUNITY)
Admission: EM | Admit: 2024-03-05 | Discharge: 2024-03-05 | Disposition: A | Discharge status: Home / Self Care | Attending: Emergency Medicine | Admitting: Emergency Medicine

## 2024-03-05 DIAGNOSIS — Z7982 Long term (current) use of aspirin: Secondary | ICD-10-CM | POA: Insufficient documentation

## 2024-03-05 DIAGNOSIS — I471 Supraventricular tachycardia, unspecified: Secondary | ICD-10-CM | POA: Diagnosis not present

## 2024-03-05 DIAGNOSIS — R000 Tachycardia, unspecified: Secondary | ICD-10-CM | POA: Diagnosis present

## 2024-03-05 LAB — CBC WITH DIFFERENTIAL/PLATELET
Abs Immature Granulocytes: 0 K/uL (ref 0.00–0.07)
Basophils Absolute: 0.1 K/uL (ref 0.0–0.1)
Basophils Relative: 1 %
Eosinophils Absolute: 0.1 K/uL (ref 0.0–0.5)
Eosinophils Relative: 1 %
HCT: 36.3 % (ref 36.0–46.0)
Hemoglobin: 11.1 g/dL — ABNORMAL LOW (ref 12.0–15.0)
Immature Granulocytes: 0 %
Lymphocytes Relative: 37 %
Lymphs Abs: 1.9 K/uL (ref 0.7–4.0)
MCH: 27.3 pg (ref 26.0–34.0)
MCHC: 30.6 g/dL (ref 30.0–36.0)
MCV: 89.4 fL (ref 80.0–100.0)
Monocytes Absolute: 0.4 K/uL (ref 0.1–1.0)
Monocytes Relative: 7 %
Neutro Abs: 2.8 K/uL (ref 1.7–7.7)
Neutrophils Relative %: 54 %
Platelets: 236 K/uL (ref 150–400)
RBC: 4.06 MIL/uL (ref 3.87–5.11)
RDW: 13.8 % (ref 11.5–15.5)
WBC: 5.1 K/uL (ref 4.0–10.5)
nRBC: 0 % (ref 0.0–0.2)

## 2024-03-05 LAB — BASIC METABOLIC PANEL WITH GFR
Anion gap: 10 (ref 5–15)
BUN: 16 mg/dL (ref 6–20)
CO2: 21 mmol/L — ABNORMAL LOW (ref 22–32)
Calcium: 9 mg/dL (ref 8.9–10.3)
Chloride: 110 mmol/L (ref 98–111)
Creatinine, Ser: 0.84 mg/dL (ref 0.44–1.00)
GFR, Estimated: >60 mL/min (ref >60)
Glucose, Bld: 80 mg/dL (ref 70–99)
Potassium: 4.2 mmol/L (ref 3.5–5.1)
Sodium: 141 mmol/L (ref 135–145)

## 2024-03-05 MED ORDER — SODIUM CHLORIDE 0.9 % IV BOLUS
1000.0000 mL | Freq: Once | INTRAVENOUS | Status: AC
  Administered 2024-03-05: 1000 mL via INTRAVENOUS

## 2024-03-05 NOTE — ED Provider Notes (Addendum)
 [place] EMERGENCY DEPARTMENT AT [facility] Provider Note   CSN: [id number] Arrival date & time: 03/05/24  1325     Patient presents with: Tachycardia   Sarah Saunders is a 48 y.o. female.   Patient with known history of supraventricular tachycardia followed by cardiology.  Patient supposed be taking Lopressor  25 mg twice a day.  But it really would make her blood pressure quite low and she did not feel good on it so cardiology said she could just take it as needed.  The rapid heart rate started at about 1250.  EMS gave her adenosine 6 mg and she converted back to sinus.  Cardiology is planning an ablation in August.  Patient feels fine now.  Blood pressure running about 94 systolic.  Will give a little bit of fluid and check electrolytes and CBC and continue cardiac monitoring just to make sure she stays out of the rapid heart rate.       Prior to Admission medications   Medication Sig Start Date End Date Taking? Authorizing Provider  acetaminophen  (TYLENOL ) 325 MG tablet Take 2 tablets (650 mg total) by mouth every 6 (six) hours. 10/23/23   Armond Cape, MD  albuterol  (PROVENTIL  HFA;VENTOLIN  HFA) 108 (90 BASE) MCG/ACT inhaler Inhale 2 puffs into the lungs every 4 (four) hours as needed for wheezing or shortness of breath.    [provider]  amoxicillin  (AMOXIL ) 500 MG capsule Take 1 capsule (500 mg total) by mouth 2 (two) times daily. 02/28/24   Dennise Kingsley, MD  aspirin  81 MG chewable tablet Chew 81 mg by mouth 2 (two) times daily. Swallow whole.    [provider]  cefTRIAXone  (ROCEPHIN ) IVPB Inject 2 g into the vein daily. Indication:  Right Hip PJI  First Dose: Yes Last Day of Therapy:  03/18/24  Labs - Once weekly:  CBC/D and BMP, Labs - Once weekly: ESR and CRP Method of administration: IV Push Method of administration may be changed at the discretion of home infusion pharmacist based upon assessment of the patient and/or caregiver's ability to  self-administer the medication ordered. 02/08/24 03/18/24  Dennise Kingsley, MD  Cetirizine HCl (ZYRTEC ALLERGY) 10 MG CAPS Take 10 mg by mouth in the morning.    [provider]  Cholecalciferol (VITAMIN D3) 50 MCG (2000 UT) capsule Take 2,000 Units by mouth daily.    [provider]  daptomycin  (CUBICIN ) IVPB Inject 800 mg into the vein daily. Indication:  Right Hip PJI  First Dose: Yes Last Day of Therapy:  03/18/24  Labs - Once weekly:  CBC/D, BMP, and CPK Labs - Once weekly: ESR and CRP Method of administration: IV Push Method of administration may be changed at the discretion of home infusion pharmacist based upon assessment of the patient and/or caregiver's ability to self-administer the medication ordered. 02/08/24 03/18/24  Dennise Kingsley, MD  diclofenac (VOLTAREN) 75 MG EC tablet Take 75 mg by mouth 2 (two) times daily.    [provider]  docusate sodium  (COLACE) 100 MG capsule Take 1 capsule (100 mg total) by mouth daily. 09/27/23   Armond Cape, MD  fluconazole  (DIFLUCAN ) 150 MG tablet Take 1 tablet (150 mg total) by mouth daily. Take  72 hours apart 02/28/24   Dennise Kingsley, MD  HYDROmorphone  (DILAUDID ) 2 MG tablet Take 2 mg by mouth every 4 (four) hours as needed for severe pain (pain score 7-10).    [provider]  HYDROmorphone  (DILAUDID ) 2 MG tablet Take 1  tablet (2 mg total) by mouth every 6 (six) hours as needed for severe pain (pain score 7-10). after hip surgery 02/09/24   Deward Eck, PA-C  ipratropium (ATROVENT ) 0.03 % nasal spray Place 1 spray into both nostrils in the morning and at bedtime. 07/23/21   [provider]  liothyronine  (CYTOMEL ) 5 MCG tablet Take 5 mcg by mouth daily. 01/01/24   [provider]  methocarbamol  (ROBAXIN ) 750 MG tablet Take 1 tablet (750 mg total) by mouth every 6 (six) hours as needed for muscle spasms. 02/09/24   Deward Eck, PA-C  metoprolol  tartrate (LOPRESSOR ) 25 MG tablet TAKE 0.5 TABLETS (12.5 MG  TOTAL) BY MOUTH 2 (TWO) TIMES DAILY AS NEEDED (SVT FLARE UPS). 12/11/23   Patwardhan, Newman PARAS, MD  ondansetron  (ZOFRAN -ODT) 4 MG disintegrating tablet Take 4 mg by mouth every 8 (eight) hours as needed. Patient not taking: Reported on 02/28/2024 01/16/24   [provider]  pantoprazole  (PROTONIX ) 40 MG tablet Take 40 mg by mouth daily. Patient not taking: Reported on 02/28/2024    [provider]  Prenatal Vit-Fe Fumarate-FA (PRENATAL VITAMIN PO) Take 2 tablets by mouth daily.    [provider]  RABEprazole (ACIPHEX) 20 MG tablet Take 20 mg by mouth daily.    [provider]  sertraline  (ZOLOFT ) 100 MG tablet Take 200 mg by mouth in the morning.    [provider]  traZODone  (DESYREL ) 50 MG tablet Take 100 mg by mouth at bedtime as needed.    [provider]    Allergies: Butorphanol and Other    Review of Systems  Constitutional:  Negative for chills and fever.  HENT:  Negative for ear pain and sore throat.   Eyes:  Negative for pain and visual disturbance.  Respiratory:  Negative for cough and shortness of breath.   Cardiovascular:  Positive for palpitations. Negative for chest pain.  Gastrointestinal:  Negative for abdominal pain and vomiting.  Genitourinary:  Negative for dysuria and hematuria.  Musculoskeletal:  Negative for arthralgias and back pain.  Skin:  Negative for color change and rash.  Neurological:  Negative for seizures and syncope.  All other systems reviewed and are negative.   Updated Vital Signs BP 110/76   Pulse 92   Temp (!) 97.4 F (36.3 C) (Oral)   SpO2 100%   Physical Exam Vitals and nursing note reviewed.  Constitutional:      General: She is not in acute distress.    Appearance: Normal appearance. She is well-developed.  HENT:     Head: Normocephalic and atraumatic.  Eyes:     Extraocular Movements: Extraocular movements intact.     Conjunctiva/sclera: Conjunctivae normal.     Pupils: Pupils  are equal, round, and reactive to light.  Cardiovascular:     Rate and Rhythm: Normal rate and regular rhythm.     Heart sounds: No murmur heard. Pulmonary:     Effort: Pulmonary effort is normal. No respiratory distress.     Breath sounds: Normal breath sounds.  Abdominal:     Palpations: Abdomen is soft.     Tenderness: There is no abdominal tenderness.  Musculoskeletal:        General: No swelling.     Cervical back: Neck supple.  Skin:    General: Skin is warm and dry.     Capillary Refill: Capillary refill takes less than 2 seconds.  Neurological:     General: No focal deficit present.     Mental Status:  She is alert and oriented to person, place, and time.  Psychiatric:        Mood and Affect: Mood normal.     (all labs ordered are listed, but only abnormal results are displayed) Labs Reviewed  CBC WITH DIFFERENTIAL/PLATELET  BASIC METABOLIC PANEL WITH GFR    EKG: EKG Interpretation Date/Time:  Wednesday March 05 2024 13:34:27 EDT Ventricular Rate:  96 PR Interval:  152 QRS Duration:  88 QT Interval:  342 QTC Calculation: 433 R Axis:   63  Text Interpretation: Sinus rhythm No significant change since last tracing Confirmed by Karry Causer [phone]) on 03/05/2024 1:40:50 PM  Radiology: No results found.   Procedures   Medications Ordered in the ED - No data to display                                  Medical Decision Making Amount and/or Complexity of Data Reviewed Labs: ordered.   EKG and cardiac monitor consistent with sinus rhythm currently.  Will check CBC basic metabolic panel and will going give a liter of fluid.  Rhythm strip from EMS consistent with SVT.  Patient CBC white count 5.1 hemoglobin 11.1 platelets 236.  Basic metabolic panel normal.  Renal function GFR is greater than 60 electrolytes are normal.  Patient has remained in sinus rhythm.  Patient stable for discharge home she has her Lopressor  to take as needed.  She will return for  any new or worse symptoms.  She has follow-up with cardiology for ablation.  Final diagnoses:  SVT (supraventricular tachycardia) [facility])    ED Discharge Orders     None          Geraldene Hamilton, MD 03/05/24 1350    Geraldene Hamilton, MD 03/05/24 1355    Geraldene Hamilton, MD 03/05/24 1515

## 2024-03-05 NOTE — ED Triage Notes (Signed)
 Patient BIB [facility] from home for fast heart rate, hx of SVT. On EMS arrival patient rate 205, patient converted with 6mg  adenosine to NSR at 97. Patient scheduled for ablation on Aug 26. A&Ox4. 20g L  forearm, 300mL NS administered as well BP 117/72 HR 97 R 18 99% RA PICC line in L arm for IV abx post revision of hip replacement

## 2024-03-05 NOTE — Discharge Instructions (Signed)
 Follow-up with cardiology as scheduled.  Take the Lopressor  as needed for rapid heart rate.  If heart rate last more than 60 minutes.  You need to get seen.  Labs here today are normal.  Work note provided.

## 2024-03-07 NOTE — Telephone Encounter (Signed)
 Pt made aware today that we unfortunately do not have anything sooner to offer, but she is at the top of the wait list if something sooner does come open.

## 2024-03-10 NOTE — Discharge Summary (Signed)
 Physician Discharge Summary  Patient ID: [name] MRN: [id number] DOB/AGE: 23-Nov-1975 48 y.o.  Admit date: 02/05/2024 Discharge date: 02/09/24  Admission Diagnoses:  Infected right total hip arthroplasty  Discharge Diagnoses:  Principal Problem:   History of revision of total replacement of right hip joint   Past Medical History:  Diagnosis Date   Allergic rhinitis 02/27/2008   Qualifier: Diagnosis of   By: Krystal MD, Reyes LABOR     IMO SNOMED Dx Update Oct 2024     Anemia    iron  def   Antepartum fetal death    Anxiety    Arthritis    Genital herpes    GERD (gastroesophageal reflux disease)    History of bariatric surgery    History of kidney stones    History of left hip replacement    History of urinary stone    Migraines    Morbid obesity (HCC) 10/09/2016   Pituitary tumor    Polycystic ovary disease    Prolactinoma (HCC)    SVT (supraventricular tachycardia) (HCC)    Vitamin D deficiency     Surgeries: Procedure(s): REVISION, TOTAL ARTHROPLASTY, HIP, ANTERIOR APPROACH on 02/05/2024   Consultants (if any): ID for PICC line   Discharged Condition: Improved  Hospital Course: Sarah Saunders is an 48 y.o. female who was admitted 02/05/2024 with a diagnosis of infected right total hip arthroplasty and went to the operating room on 02/05/2024 and underwent the above named procedures.    She was given perioperative antibiotics:  Anti-infectives (From admission, onward)    Start     Dose/Rate Route Frequency Ordered Stop   02/08/24 1115  cefTRIAXone  (ROCEPHIN ) 2 g in sodium chloride  0.9 % 100 mL IVPB  Status:  Discontinued        2 g 200 mL/hr over 30 Minutes Intravenous Every 24 hours 02/08/24 1021 02/09/24 2339   02/08/24 0000  cefTRIAXone  (ROCEPHIN ) IVPB        2 g Intravenous Every 24 hours 02/08/24 1025 03/18/24 2359   02/08/24 0000  daptomycin  (CUBICIN ) IVPB        800 mg Intravenous Every 24 hours 02/08/24 1025 03/18/24 2359   02/06/24 1100   Ampicillin -Sulbactam (UNASYN ) 3 g in sodium chloride  0.9 % 100 mL IVPB  Status:  Discontinued        3 g 200 mL/hr over 30 Minutes Intravenous Every 6 hours 02/06/24 0953 02/08/24 1021   02/06/24 1045  DAPTOmycin  (CUBICIN ) 800 mg in sodium chloride  0.9 % IVPB  Status:  Discontinued        10 mg/kg  83.4 kg (Adjusted) 132 mL/hr over 30 Minutes Intravenous Daily 02/06/24 0953 02/09/24 2339   02/05/24 2200  amoxicillin  (AMOXIL ) capsule 500 mg  Status:  Discontinued        500 mg Oral 3 times daily 02/05/24 1857 02/06/24 0953   02/05/24 2145  ceFAZolin  (ANCEF ) IVPB 2g/100 mL premix        2 g 200 mL/hr over 30 Minutes Intravenous Every 6 hours 02/05/24 1857 02/06/24 0425   02/05/24 1626  vancomycin  (VANCOCIN ) powder  Status:  Discontinued          As needed 02/05/24 1627 02/05/24 1851   02/05/24 1330  ceFAZolin  (ANCEF ) IVPB 2g/100 mL premix        2 g 200 mL/hr over 30 Minutes Intravenous  Once 02/05/24 1325 02/05/24 1544     .  She was given sequential compression devices, early ambulation, and ASA for DVT  prophylaxis.  She benefited maximally from the hospital stay and there were no complications.    Recent vital signs:  Vitals:   02/08/24 2153 02/09/24 0458  BP: 103/64 112/71  Pulse: 99 92  Resp: 17 17  Temp: 98.5 F (36.9 C) 98.2 F (36.8 C)  SpO2: 99% 99%    Recent laboratory studies:  Lab Results  Component Value Date   HGB 11.1 (L) 03/05/2024   HGB 9.8 (L) 02/13/2024   HGB 8.9 (L) 02/07/2024   Lab Results  Component Value Date   WBC 5.1 03/05/2024   PLT 236 03/05/2024   Lab Results  Component Value Date   INR 1.0 06/19/2022   Lab Results  Component Value Date   NA 141 03/05/2024   K 4.2 03/05/2024   CL 110 03/05/2024   CO2 21 (L) 03/05/2024   BUN 16 03/05/2024   CREATININE 0.84 03/05/2024   GLUCOSE 80 03/05/2024    Discharge Medications:   Allergies as of 02/09/2024       Reactions   Butorphanol Hives, Itching   Other Other (See Comments)    Seasonal allergies        Medication List     STOP taking these medications    amoxicillin  500 MG capsule Commonly known as: AMOXIL    ibuprofen  600 MG tablet Commonly known as: ADVIL    oxyCODONE  5 MG immediate release tablet Commonly known as: Oxy IR/ROXICODONE    sulfamethoxazole -trimethoprim  800-160 MG tablet Commonly known as: BACTRIM  DS       TAKE these medications    acetaminophen  325 MG tablet Commonly known as: TYLENOL  Take 2 tablets (650 mg total) by mouth every 6 (six) hours. What changed:  how much to take when to take this reasons to take this   albuterol  108 (90 Base) MCG/ACT inhaler Commonly known as: VENTOLIN  HFA Inhale 2 puffs into the lungs every 4 (four) hours as needed for wheezing or shortness of breath.   aspirin  81 MG chewable tablet Chew 81 mg by mouth 2 (two) times daily. Swallow whole.   cefTRIAXone  IVPB Commonly known as: ROCEPHIN  Inject 2 g into the vein daily. Indication:  Right Hip PJI  First Dose: Yes Last Day of Therapy:  03/18/24  Labs - Once weekly:  CBC/D and BMP, Labs - Once weekly: ESR and CRP Method of administration: IV Push Method of administration may be changed at the discretion of home infusion pharmacist based upon assessment of the patient and/or caregiver's ability to self-administer the medication ordered.   daptomycin  IVPB Commonly known as: CUBICIN  Inject 800 mg into the vein daily. Indication:  Right Hip PJI  First Dose: Yes Last Day of Therapy:  03/18/24  Labs - Once weekly:  CBC/D, BMP, and CPK Labs - Once weekly: ESR and CRP Method of administration: IV Push Method of administration may be changed at the discretion of home infusion pharmacist based upon assessment of the patient and/or caregiver's ability to self-administer the medication ordered.   diclofenac 75 MG EC tablet Commonly known as: VOLTAREN Take 75 mg by mouth 2 (two) times daily.   docusate sodium  100 MG capsule Commonly known as:  COLACE Take 1 capsule (100 mg total) by mouth daily. What changed:  how much to take when to take this   HYDROmorphone  2 MG tablet Commonly known as: DILAUDID  Take 2 mg by mouth every 4 (four) hours as needed for severe pain (pain score 7-10). What changed: Another medication with the same name was added. Make sure  you understand how and when to take each.   HYDROmorphone  2 MG tablet Commonly known as: Dilaudid  Take 1 tablet (2 mg total) by mouth every 6 (six) hours as needed for severe pain (pain score 7-10). after hip surgery What changed: You were already taking a medication with the same name, and this prescription was added. Make sure you understand how and when to take each.   ipratropium 0.03 % nasal spray Commonly known as: ATROVENT  Place 1 spray into both nostrils in the morning and at bedtime.   liothyronine  5 MCG tablet Commonly known as: CYTOMEL  Take 5 mcg by mouth daily.   methocarbamol  750 MG tablet Commonly known as: ROBAXIN  Take 1 tablet (750 mg total) by mouth every 6 (six) hours as needed for muscle spasms.   metoprolol  tartrate 25 MG tablet Commonly known as: LOPRESSOR  TAKE 0.5 TABLETS (12.5 MG TOTAL) BY MOUTH 2 (TWO) TIMES DAILY AS NEEDED (SVT FLARE UPS).   ondansetron  4 MG disintegrating tablet Commonly known as: ZOFRAN -ODT Take 4 mg by mouth every 8 (eight) hours as needed.   pantoprazole  40 MG tablet Commonly known as: PROTONIX  Take 40 mg by mouth daily.   PRENATAL VITAMIN PO Take 2 tablets by mouth daily.   RABEprazole 20 MG tablet Commonly known as: ACIPHEX Take 20 mg by mouth daily.   sertraline  100 MG tablet Commonly known as: ZOLOFT  Take 200 mg by mouth in the morning.   traZODone  50 MG tablet Commonly known as: DESYREL  Take 100 mg by mouth at bedtime as needed.   Vitamin D3 50 MCG (2000 UT) capsule Take 2,000 Units by mouth daily.   ZyrTEC Allergy 10 MG Caps Generic drug: Cetirizine HCl Take 10 mg by mouth in the morning.                Discharge Care Instructions  (From admission, onward)           Start     Ordered   02/09/24 0000  Weight bearing as tolerated        02/09/24 1626   02/08/24 0000  Change dressing on IV access line weekly and PRN  (Home infusion instructions - [facility] )        02/08/24 1025            Diagnostic Studies: No results found.  Disposition: Discharge disposition: 06-Home-Health Care Svc       Discharge Instructions     [facility] pharmacist to adjust dose for Vancomycin , Aminoglycosides and other anti-infective therapies as requested by physician.   Complete by: As directed    [facility] to provide Cath Flo 2mg    Complete by: As directed    Administer for PICC line occlusion and as ordered by physician for other access device issues.   Anaphylaxis Kit: Provided to treat any anaphylactic reaction to the medication being provided to the patient if First Dose or when requested by physician   Complete by: As directed    Epinephrine 1mg /ml vial / amp: Administer 0.3mg  (0.17ml) subcutaneously once for moderate to severe anaphylaxis, nurse to call physician and pharmacy when reaction occurs and call 911 if needed for immediate care   Diphenhydramine  50mg /ml IV vial: Administer 25-50mg  IV/IM PRN for first dose reaction, rash, itching, mild reaction, nurse to call physician and pharmacy when reaction occurs   Sodium Chloride  0.9% NS 500ml IV: Administer if needed for hypovolemic blood pressure drop or as ordered by physician after call to physician with  anaphylactic reaction   Call MD / Call 911   Complete by: As directed    If you experience chest pain or shortness of breath, CALL 911 and be transported to the hospital emergency room.  If you develope a fever above 101 F, pus (white drainage) or increased drainage or redness at the wound, or calf pain, call your surgeon's office.   Change dressing on IV access line weekly and PRN    Complete by: As directed    Diet - low sodium heart healthy   Complete by: As directed    Discharge instructions   Complete by: As directed    You may bear weight as tolerated. Keep your dressing on and dry until follow up. Do not remove wound vac until office follow up visit with surgeon. Can change canister if full. Take medicine to prevent blood clots as directed. Take pain medicine as needed with the goal of transitioning to over the counter medicines. It is okay to take 2 tablets of your narcotic medicine instead of just 1 tablet in the first 1-2 days after surgery when pain is at the worst. Do not continue to do this for multiple days at a time beyond that though.    INSTRUCTIONS AFTER JOINT REPLACEMENT   Remove items at home which could result in a fall. This includes throw rugs or furniture in walking pathways ICE to the affected joint every three hours while awake for 30 minutes at a time, for at least the first 3-5 days, and then as needed for pain and swelling.  Continue to use ice for pain and swelling. You may notice swelling that will progress down to the foot and ankle.  This is normal after surgery.  Elevate your leg when you are not up walking on it.   Continue to use the breathing machine you got in the hospital (incentive spirometer) which will help keep your temperature down.  It is common for your temperature to cycle up and down following surgery, especially at night when you are not up moving around and exerting yourself.  The breathing machine keeps your lungs expanded and your temperature down.   DIET:  As you were doing prior to hospitalization, we recommend a well-balanced diet.  DRESSING / WOUND CARE / SHOWERING  You may shower 3 days after surgery, but keep the wounds dry during showering.  You may use an occlusive plastic wrap (Press'n Seal for example) with blue painter's tape at edges, NO SOAKING/SUBMERGING IN THE BATHTUB.  If the bandage gets wet, call the  office.   ACTIVITY  Increase activity slowly as tolerated, but follow the weight bearing instructions below.   No driving for 6 weeks or until further direction given by your physician.  You cannot drive while taking narcotics.  No lifting or carrying greater than 10 lbs. until further directed by your surgeon. Avoid periods of inactivity such as sitting longer than an hour when not asleep. This helps prevent blood clots.  You may return to work once you are authorized by your doctor.    WEIGHT BEARING   Weight bearing as tolerated with assist device (walker, cane, etc) as directed, use it as long as suggested by your surgeon or therapist, typically at least 4-6 weeks.   EXERCISES  Results after joint replacement surgery are often greatly improved when you follow the exercise, range of motion and muscle strengthening exercises prescribed by your doctor. Safety measures are also important to protect the joint from  further injury. Any time any of these exercises cause you to have increased pain or swelling, decrease what you are doing until you are comfortable again and then slowly increase them. If you have problems or questions, call your caregiver or physical therapist for advice.   Rehabilitation is important following a joint replacement. After just a few days of immobilization, the muscles of the leg can become weakened and shrink (atrophy).  These exercises are designed to build up the tone and strength of the thigh and leg muscles and to improve motion. Often times heat used for twenty to thirty minutes before working out will loosen up your tissues and help with improving the range of motion but do not use heat for the first two weeks following surgery (sometimes heat can increase post-operative swelling).   These exercises can be done on a training (exercise) mat, on the floor, on a table or on a bed. Use whatever works the best and is most comfortable for you.    Use music or  television while you are exercising so that the exercises are a pleasant break in your day. This will make your life better with the exercises acting as a break in your routine that you can look forward to.   Perform all exercises about fifteen times, three times per day or as directed.  You should exercise both the operative leg and the other leg as well.  Exercises include:   Quad Sets - Tighten up the muscle on the front of the thigh (Quad) and hold for 5-10 seconds.   Straight Leg Raises - With your knee straight (if you were given a brace, keep it on), lift the leg to 60 degrees, hold for 3 seconds, and slowly lower the leg.  Perform this exercise against resistance later as your leg gets stronger.  Leg Slides: Lying on your back, slowly slide your foot toward your buttocks, bending your knee up off the floor (only go as far as is comfortable). Then slowly slide your foot back down until your leg is flat on the floor again.  Angel Wings: Lying on your back spread your legs to the side as far apart as you can without causing discomfort.  Hamstring Strength:  Lying on your back, push your heel against the floor with your leg straight by tightening up the muscles of your buttocks.  Repeat, but this time bend your knee to a comfortable angle, and push your heel against the floor.  You may put a pillow under the heel to make it more comfortable if necessary.   A rehabilitation program following joint replacement surgery can speed recovery and prevent re-injury in the future due to weakened muscles. Contact your doctor or a physical therapist for more information on knee rehabilitation.    CONSTIPATION  Constipation is defined medically as fewer than three stools per week and severe constipation as less than one stool per week.  Even if you have a regular bowel pattern at home, your normal regimen is likely to be disrupted due to multiple reasons following surgery.  Combination of anesthesia,  postoperative narcotics, change in appetite and fluid intake all can affect your bowels.   YOU MUST use at least one of the following options; they are listed in order of increasing strength to get the job done.  They are all available over the counter, and you may need to use some, POSSIBLY even all of these options:    Drink plenty of fluids (prune juice  may be helpful) and high fiber foods Colace 100 mg by mouth twice a day  Senokot for constipation as directed and as needed Dulcolax (bisacodyl ), take with full glass of water   Miralax  (polyethylene glycol) once or twice a day as needed.  If you have tried all these things and are unable to have a bowel movement in the first 3-4 days after surgery call either your surgeon or your primary doctor.    If you experience loose stools or diarrhea, hold the medications until you stool forms back up.  If your symptoms do not get better within 1 week or if they get worse, check with your doctor.  If you experience the worst abdominal pain ever or develop nausea or vomiting, please contact the office immediately for further recommendations for treatment.   ITCHING:  If you experience itching with your medications, try taking only a single pain pill, or even half a pain pill at a time.  You can also use Benadryl  over the counter for itching or also to help with sleep.   TED HOSE STOCKINGS:  Use stockings on both legs until for at least 2 weeks or as directed by physician office. They may be removed at night for sleeping.  MEDICATIONS:  See your medication summary on the After Visit Summary that nursing will review with you.  You may have some home medications which will be placed on hold until you complete the course of blood thinner medication.  It is important for you to complete the blood thinner medication as prescribed.  Take medicines as prescribed.   You have several different medicines that work in different ways. - Tylenol  is for mild to  moderate pain. Try to take this medicine before turning to your narcotic medicines.  - Diclofenac is to reduce pain / inflammation - Robaxin  is for muscle spasms. This medicine can make you drowsy. - Dilaudid  is a narcotic pain medicine.  Take this for severe pain. This medicine can be dehydrating / constipating. - Zofran  is for nausea and vomiting. - Colace is for constipation prevention while taking pain medicine.  - Aspirin  is to prevent blood clots after surgery. YOU MUST TAKE THIS MEDICINE!!  PRECAUTIONS:  If you experience chest pain or shortness of breath - call 911 immediately for transfer to the hospital emergency department.   If you develop a fever greater that 101 F, purulent drainage from wound, increased redness or drainage from wound, foul odor from the wound/dressing, or calf pain - CONTACT YOUR SURGEON.                                                   FOLLOW-UP APPOINTMENTS:  If you do not already have a post-op appointment, please call the office [phone] for an appointment to be seen by Dr. Beverley in 2 weeks.   OTHER INSTRUCTIONS:   MAKE SURE YOU:  Understand these instructions.  Get help right away if you are not doing well or get worse.    Thank you for letting us  be a part of your medical care team.  It is a privilege we respect greatly.  We hope these instructions will help you stay on track for a fast and full recovery!   Driving restrictions   Complete by: As directed    No driving for 2-6 weeks   Flush  IV access with Sodium Chloride  0.9% and Heparin 10 units/ml or 100 units/ml   Complete by: As directed    Home infusion instructions - [facility]   Complete by: As directed    Instructions: Flush IV access with Sodium Chloride  0.9% and Heparin 10units/ml or 100units/ml   Change dressing on IV access line: Weekly and PRN   Instructions Cath Flo 2mg : Administer for PICC Line occlusion and as ordered by physician for other access device   [facility] pharmacist to adjust dose for: Vancomycin , Aminoglycosides and other anti-infective therapies as requested by physician   Method of administration may be changed at the discretion of home infusion pharmacist based upon assessment of the patient and/or caregiver's ability to self-administer the medication ordered   Complete by: As directed    Post-operative opioid taper instructions:   Complete by: As directed    POST-OPERATIVE OPIOID TAPER INSTRUCTIONS: It is important to wean off of your opioid medication as soon as possible. If you do not need pain medication after your surgery it is ok to stop day one. Opioids include: Codeine, Hydrocodone (Norco, Vicodin), Oxycodone (Percocet, oxycontin ) and hydromorphone  amongst others.  Long term and even short term use of opiods can cause: Increased pain response Dependence Constipation Depression Respiratory depression And more.  Withdrawal symptoms can include Flu like symptoms Nausea, vomiting And more Techniques to manage these symptoms Hydrate well Eat regular healthy meals Stay active Use relaxation techniques(deep breathing, meditating, yoga) Do Not substitute Alcohol  to help with tapering If you have been on opioids for less than two weeks and do not have pain than it is ok to stop all together.  Plan to wean off of opioids This plan should start within one week post op of your joint replacement. Maintain the same interval or time between taking each dose and first decrease the dose.  Cut the total daily intake of opioids by one tablet each day Next start to increase the time between doses. The last dose that should be eliminated is the evening dose.      TED hose   Complete by: As directed    Use stockings (TED hose) for 2 weeks on right leg(s).  You may remove them at night for sleeping.   Weight bearing as tolerated   Complete by: As directed         Follow-up Information     Beverley Evalene BIRCH, MD. Go on  02/13/2024.   Specialty: Orthopedic Surgery Why: Your appointment is at 4:15pm Contact information: [address] [place] [address] [phone]                  Signed: Gerard CHRISTELLA Ted DEVONNA Office [phone] 03/10/2024, 3:48 PM

## 2024-03-18 NOTE — Telephone Encounter (Signed)
 [name] with [facility] reached out to office for pull picc orders. States they only have orders from hospital. Provided orders to pull picc noted on 7/10 appt Lorenda CHRISTELLA Code, RMA

## 2024-03-20 ENCOUNTER — Ambulatory Visit (INDEPENDENT_AMBULATORY_CARE_PROVIDER_SITE_OTHER): Admitting: Internal Medicine

## 2024-03-20 ENCOUNTER — Other Ambulatory Visit: Payer: Self-pay

## 2024-03-20 VITALS — BP 108/76 | HR 70 | Temp 98.0°F | Wt 257.6 lb

## 2024-03-20 DIAGNOSIS — Z96641 Presence of right artificial hip joint: Secondary | ICD-10-CM

## 2024-03-20 NOTE — Progress Notes (Signed)
 Patient: Sarah Saunders  DOB: 20-Aug-1976 MRN: [id number] PCP: Verdia Lombard, MD   Patient Active Problem List   Diagnosis Date Noted   History of revision of total replacement of right hip joint 02/05/2024   ABLA (acute blood loss anemia) 10/20/2023   Pregnancy 10/19/2023   S/P C-section 10/19/2023   AMA (advanced maternal age) multigravida 35+, third trimester 10/19/2023   Decreased fetal movement affecting management of pregnancy in third trimester, fetus 1 10/19/2023   Placental abruption in third trimester 10/19/2023   Short cervix affecting pregnancy 09/25/2023   Kidney stone complicating pregnancy 07/05/2023   IUFD at 20 weeks or more of gestation 06/19/2022   PSVT (paroxysmal supraventricular tachycardia) (HCC) 05/26/2019   Iron  deficiency anemia 12/13/2016   Morbid obesity (HCC) 10/09/2016   Tachycardia 01/08/2012   Benign neoplasm of skin 03/06/2008   POLYCYSTIC OVARIAN DISEASE 02/27/2008   Overweight 02/27/2008     Subjective:  48 year old female with past medical history of bariatric surgery, SVT followed by cardiology, PCOD, C-section in 2025 presents for 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  Patient taken to the OR on 6/18 for ORIF finding showing right hip purulent fluid.  Patient-amoxicillin  prior to OR.  Noted that this Synovasure in clinic was positive for E faecalis.  She was discharged on daptomycin  and ceftriaxone  x 6 weeks EOT 7/29 then plan for amoxicillin  for p.o. suppression to cover E faecalis.Reprots  she thinks she has a yeast infection with itching on abx.  Today: PICC line out. No complaints, doing well. On amox.   Review of Systems  All other systems reviewed and are negative.   Past Medical History:  Diagnosis Date   Allergic rhinitis 02/27/2008   Qualifier: Diagnosis of   By: Krystal MD, Reyes LABOR     IMO SNOMED Dx Update Oct 2024     Anemia    iron  def   Antepartum fetal death    Anxiety    Arthritis     Genital herpes    GERD (gastroesophageal reflux disease)    History of bariatric surgery    History of kidney stones    History of left hip replacement    History of urinary stone    Migraines    Morbid obesity (HCC) 10/09/2016   Pituitary tumor    Polycystic ovary disease    Prolactinoma (HCC)    SVT (supraventricular tachycardia) (HCC)    Vitamin D deficiency     Outpatient Medications Prior to Visit  Medication Sig Dispense Refill   acetaminophen  (TYLENOL ) 325 MG tablet Take 2 tablets (650 mg total) by mouth every 6 (six) hours. 30 tablet 0   albuterol  (PROVENTIL  HFA;VENTOLIN  HFA) 108 (90 BASE) MCG/ACT inhaler Inhale 2 puffs into the lungs every 4 (four) hours as needed for wheezing or shortness of breath.     amoxicillin  (AMOXIL ) 500 MG capsule Take 1 capsule (500 mg total) by mouth 2 (two) times daily. 60 capsule 5   aspirin  81 MG chewable tablet Chew 81 mg by mouth 2 (two) times daily. Swallow whole.     Cetirizine HCl (ZYRTEC ALLERGY) 10 MG CAPS Take 10 mg by mouth in the morning.     Cholecalciferol (VITAMIN D3) 50 MCG (2000 UT) capsule Take 2,000 Units by mouth daily.     diclofenac (VOLTAREN) 75 MG EC tablet Take 75 mg by mouth 2 (two) times daily.     docusate sodium  (COLACE) 100 MG capsule Take 1 capsule (  100 mg total) by mouth daily. 10 capsule 0   fluconazole  (DIFLUCAN ) 150 MG tablet Take 1 tablet (150 mg total) by mouth daily. Take  72 hours apart 2 tablet 3   HYDROmorphone  (DILAUDID ) 2 MG tablet Take 2 mg by mouth every 4 (four) hours as needed for severe pain (pain score 7-10).     HYDROmorphone  (DILAUDID ) 2 MG tablet Take 1 tablet (2 mg total) by mouth every 6 (six) hours as needed for severe pain (pain score 7-10). after hip surgery 30 tablet 0   ipratropium (ATROVENT ) 0.03 % nasal spray Place 1 spray into both nostrils in the morning and at bedtime.     liothyronine  (CYTOMEL ) 5 MCG tablet Take 5 mcg by mouth daily.     methocarbamol  (ROBAXIN ) 750 MG tablet Take 1  tablet (750 mg total) by mouth every 6 (six) hours as needed for muscle spasms. 60 tablet 0   metoprolol  tartrate (LOPRESSOR ) 25 MG tablet TAKE 0.5 TABLETS (12.5 MG TOTAL) BY MOUTH 2 (TWO) TIMES DAILY AS NEEDED (SVT FLARE UPS). 90 tablet 2   ondansetron  (ZOFRAN -ODT) 4 MG disintegrating tablet Take 4 mg by mouth every 8 (eight) hours as needed. (Patient not taking: Reported on 02/28/2024)     pantoprazole  (PROTONIX ) 40 MG tablet Take 40 mg by mouth daily. (Patient not taking: Reported on 02/28/2024)     Prenatal Vit-Fe Fumarate-FA (PRENATAL VITAMIN PO) Take 2 tablets by mouth daily.     RABEprazole (ACIPHEX) 20 MG tablet Take 20 mg by mouth daily.     sertraline  (ZOLOFT ) 100 MG tablet Take 200 mg by mouth in the morning.     traZODone  (DESYREL ) 50 MG tablet Take 100 mg by mouth at bedtime as needed.     No facility-administered medications prior to visit.     Allergies  Allergen Reactions   Butorphanol Hives and Itching   Other Other (See Comments)    Seasonal allergies    Social History   Tobacco Use   Smoking status: Never   Smokeless tobacco: Never  Vaping Use   Vaping status: Never Used  Substance Use Topics   Alcohol  use: No   Drug use: No    Family History  Problem Relation Age of Onset   Hypertension Father    Heart failure Father    Hypertension Mother    Heart failure Brother     Objective:  There were no vitals filed for this visit. There is no height or weight on file to calculate BMI.  Physical Exam Constitutional:      Appearance: Normal appearance.  HENT:     Head: Normocephalic and atraumatic.     Right Ear: Tympanic membrane normal.     Left Ear: Tympanic membrane normal.     Nose: Nose normal.     Mouth/Throat:     Mouth: Mucous membranes are moist.  Eyes:     Extraocular Movements: Extraocular movements intact.     Conjunctiva/sclera: Conjunctivae normal.     Pupils: Pupils are equal, round, and reactive to light.  Cardiovascular:     Rate and  Rhythm: Normal rate and regular rhythm.     Heart sounds: No murmur heard.    No friction rub. No gallop.  Pulmonary:     Effort: Pulmonary effort is normal.     Breath sounds: Normal breath sounds.  Abdominal:     General: Abdomen is flat.     Palpations: Abdomen is soft.  Skin:    General: Skin  is warm and dry.  Neurological:     General: No focal deficit present.     Mental Status: She is alert and oriented to person, place, and time.  Psychiatric:        Mood and Affect: Mood normal.     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  Patient was on [place] prior to hospitalization.  At clinic her R[place] was positive E faecalis.  Blood cultures no growth. - Discharged on daptomycin  and ceftriaxone 's x 6 weeks EOT 7/29 then transitioned to Amoxil  500 mg p.o. twice daily(started on 7/30 -Pt follows with ortho  Plan -f/u in one month to do labs, continue amox  #Medication management #PICC line-out 7/16 WBC 5k, Scr 0.84      #Candida vaginitis -fluconazole , resolved -testing negative at last visit.   Loney Stank, MD [facility]   03/20/24  5:28 AM I have personally spent 42 minutes involved in face-to-face and non-face-to-face activities for this patient on the day of the visit. Professional time  spent includes the following activities: Preparing to see the patient (review of tests), Obtaining and/or reviewing separately obtained history (admission/discharge record), Performing a medically appropriate examination and/or evaluation , Ordering medications/tests/procedures, referring and communicating with other health care professionals, Documenting clinical information in the EMR, Independently interpreting results (not separately reported), Communicating results to the patient/family/caregiver, Counseling and educating the patient/family/caregiver and Care coordination (not separately reported).

## 2024-03-27 ENCOUNTER — Telehealth: Payer: Self-pay | Admitting: Cardiology

## 2024-03-27 ENCOUNTER — Other Ambulatory Visit: Payer: Self-pay | Admitting: Urology

## 2024-03-27 NOTE — Telephone Encounter (Signed)
   Pre-operative Risk Assessment    Patient Name: Sarah Saunders  DOB: 10-Jun-1976 MRN: [id number]      Request for Surgical Clearance    Procedure:  Right extracorporeal shock wave lithotripsy  Date of Surgery:  Clearance 04/07/24                                 Surgeon: Dr. Donnice Siad Surgeon's Group or Practice Name: [facility] Phone number: [phone] Fax number: [phone]   Type of Clearance Requested:   - Medical  - Pharmacy:  Hold Aspirin  2 days    Type of Anesthesia:  Local    Additional requests/questions:     Bonney Willie Daring   03/27/2024, 12:25 PM

## 2024-03-27 NOTE — Telephone Encounter (Signed)
   Name: Sarah Saunders  DOB: [birth year]  MRN: [id number]  Primary Cardiologist: Newman JINNY Lawrence, MD  Chart reviewed as part of pre-operative protocol coverage. Because of Sarah Saunders's past medical history and time since last visit, Sarah Saunders will require a follow-up telephone visit in order to better assess preoperative cardiovascular risk.  Pre-op covering staff: - Please schedule appointment and call patient to inform them. If patient already had an upcoming appointment within acceptable timeframe, please add pre-op clearance to the appointment notes so provider is aware. - Please contact requesting surgeon's office via preferred method (i.e, phone, fax) to inform them of need for appointment prior to surgery.  As long as no new cardiovascular symptoms, can hold aspirin  x 2 days prior to procedure (up to 5 days is allowed) and resume when medically safe to do so.  When cleared for hip surgery, Dr. Inocencio mentions that if Sarah Saunders has an episode of SVT around the time of surgery adenosine will be needed for treatment.  Orren LOISE Fabry, PA-C  03/27/2024, 12:53 PM

## 2024-03-27 NOTE — Telephone Encounter (Signed)
 Left a detailed message for pt. To call back to schedule a pre-op appt. 1st attempt.

## 2024-03-31 NOTE — Telephone Encounter (Signed)
 2nd attempt: Tried calling pt to schedule TELE Preop appt. Voicemail message stats unable to complete call at this time.

## 2024-03-31 NOTE — Progress Notes (Signed)
 Attempted to call patient to review history and instructions for upcoming ESWL. Call cannot be completed at this time, and no option for voicemail.

## 2024-04-01 ENCOUNTER — Telehealth (HOSPITAL_BASED_OUTPATIENT_CLINIC_OR_DEPARTMENT_OTHER): Payer: Self-pay

## 2024-04-01 NOTE — Telephone Encounter (Signed)
 Preop televisit has now been scheduled

## 2024-04-01 NOTE — Telephone Encounter (Signed)
  Patient Consent for Virtual Visit        Sarah Saunders has provided verbal consent on 04/01/2024 for a virtual visit (video or telephone).   CONSENT FOR VIRTUAL VISIT FOR:  Sarah Saunders  By participating in this virtual visit I agree to the following:  I hereby voluntarily request, consent and authorize [facility] and its employed or contracted physicians, physician assistants, nurse practitioners or other licensed health care professionals (the Practitioner), to provide me with telemedicine health care services (the "Services) as deemed necessary by the treating Practitioner. I acknowledge and consent to receive the Services by the Practitioner via telemedicine. I understand that the telemedicine visit will involve communicating with the Practitioner through live audiovisual communication technology and the disclosure of certain medical information by electronic transmission. I acknowledge that I have been given the opportunity to request an in-person assessment or other available alternative prior to the telemedicine visit and am voluntarily participating in the telemedicine visit.  I understand that I have the right to withhold or withdraw my consent to the use of telemedicine in the course of my care at any time, without affecting my right to future care or treatment, and that the Practitioner or I may terminate the telemedicine visit at any time. I understand that I have the right to inspect all information obtained and/or recorded in the course of the telemedicine visit and may receive copies of available information for a reasonable fee.  I understand that some of the potential risks of receiving the Services via telemedicine include:  Delay or interruption in medical evaluation due to technological equipment failure or disruption; Information transmitted may not be sufficient (e.g. poor resolution of images) to allow for appropriate medical decision making by the  Practitioner; and/or  In rare instances, security protocols could fail, causing a breach of personal health information.  Furthermore, I acknowledge that it is my responsibility to provide information about my medical history, conditions and care that is complete and accurate to the best of my ability. I acknowledge that Practitioner's advice, recommendations, and/or decision may be based on factors not within their control, such as incomplete or inaccurate data provided by me or distortions of diagnostic images or specimens that may result from electronic transmissions. I understand that the practice of medicine is not an exact science and that Practitioner makes no warranties or guarantees regarding treatment outcomes. I acknowledge that a copy of this consent can be made available to me via my patient portal [facility] MyChart), or I can request a printed copy by calling the office of  [facility].    I understand that my insurance will be billed for this visit.   I have read or had this consent read to me. I understand the contents of this consent, which adequately explains the benefits and risks of the Services being provided via telemedicine.  I have been provided ample opportunity to ask questions regarding this consent and the Services and have had my questions answered to my satisfaction. I give my informed consent for the services to be provided through the use of telemedicine in my medical care

## 2024-04-01 NOTE — Telephone Encounter (Signed)
 error

## 2024-04-01 NOTE — Telephone Encounter (Signed)
 Called pt to schedule VV for preop clearance. Unable to reach pt. Phone cal could not be completed. Third and final attempt.

## 2024-04-03 ENCOUNTER — Ambulatory Visit: Attending: Internal Medicine | Admitting: Nurse Practitioner

## 2024-04-03 DIAGNOSIS — Z01810 Encounter for preprocedural cardiovascular examination: Secondary | ICD-10-CM

## 2024-04-03 NOTE — Progress Notes (Addendum)
 Virtual Visit via Telephone Note   Because of Terilynn T Zappulla co-morbid illnesses, she is at least at moderate risk for complications without adequate follow up.  This format is felt to be most appropriate for this patient at this time.  Due to technical limitations with video connection (technology), today's appointment will be conducted as an audio only telehealth visit, and MALKIA NIPPERT verbally agreed to proceed in this manner.   All issues noted in this document were discussed and addressed.  No physical exam could be performed with this format.  Evaluation Performed:  Preoperative cardiovascular risk assessment _____________   Date:  04/03/2024   Patient ID:  Julianna ONEIDA Doss, DOB [birth year], MRN [id number] Patient Location:  Home Provider location:   Office  Primary Care Provider:  Verdia Lombard, MD Primary Cardiologist:  Newman JINNY Lawrence, MD  Chief Complaint / Patient Profile   48 y.o. y/o female with a h/o SVT, anemia, GERD, arthritis, and obesity who is pending right extracorporeal shockwave lithotripsy on 04/07/2024 with Dr. Donnice Siad of [facility] and presents today for telephonic preoperative cardiovascular risk assessment.  History of Present Illness    Joscelin KA BENCH is a 48 y.o. female who presents via audio/video conferencing for a telehealth visit today.  Pt was last seen in cardiology clinic on 11/20/2023 by Dr. Inocencio. At that time ILANNA DEIHL was doing well.  The patient is now pending procedure as outlined above. Since her last visit, she has been stable overall from a cardiac standpoint.  She did have an episode of SVT that lasted greater than 45 minutes.  She was seen in the ED, she received adenosine with restoration of normal sinus rhythm.  She is pending SVT ablation on 04/15/2024.  She denies chest pain, palpitations, dyspnea, pnd, orthopnea, n, v, dizziness, syncope, edema, weight gain, or early satiety. All other systems reviewed  and are otherwise negative except as noted above.   Past Medical History    Past Medical History:  Diagnosis Date   Allergic rhinitis 02/27/2008   Qualifier: Diagnosis of   By: Krystal MD, Reyes LABOR     IMO SNOMED Dx Update Oct 2024     Anemia    iron  def   Antepartum fetal death    Anxiety    Arthritis    Genital herpes    GERD (gastroesophageal reflux disease)    History of bariatric surgery    History of kidney stones    History of left hip replacement    History of urinary stone    Migraines    Morbid obesity (HCC) 10/09/2016   Pituitary tumor    Polycystic ovary disease    Prolactinoma (HCC)    SVT (supraventricular tachycardia) (HCC)    Vitamin D deficiency    Past Surgical History:  Procedure Laterality Date   ANTERIOR HIP REVISION Right 02/05/2024   Procedure: REVISION, TOTAL ARTHROPLASTY, HIP, ANTERIOR APPROACH;  Surgeon: Beverley Evalene BIRCH, MD;  Location: [facility];  Service: Orthopedics;  Laterality: Right;   CESAREAN SECTION N/A 10/19/2023   Procedure: CESAREAN SECTION;  Surgeon: Herchel Gloris LABOR, MD;  Location: [facility] LD ORS;  Service: Obstetrics;  Laterality: N/A;   CYSTOSCOPY/URETEROSCOPY/HOLMIUM LASER/STENT PLACEMENT Right 07/06/2023   Procedure: CYSTOSCOPY/RIGHT RETROGRADE URETEROSCOPY/HOLMIUM LASER/STENT PLACEMENT;  Surgeon: Siad Donnice SAUNDERS, MD;  Location: [facility];  Service: Urology;  Laterality: Right;   JOINT REPLACEMENT Left 01/13/2021   hip   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)  TUBAL LIGATION     right fallopian tube removed   tube removed      Allergies  Allergies  Allergen Reactions   Butorphanol Hives and Itching   Other Other (See Comments)    Seasonal allergies    Home Medications    Prior to Admission medications   Medication Sig Start Date End Date Taking? Authorizing Provider  acetaminophen  (TYLENOL ) 325 MG tablet Take 2 tablets (650 mg total) by mouth every 6 (six) hours. 10/23/23   Armond Cape, MD   albuterol  (PROVENTIL  HFA;VENTOLIN  HFA) 108 (90 BASE) MCG/ACT inhaler Inhale 2 puffs into the lungs every 4 (four) hours as needed for wheezing or shortness of breath.    [provider]  amoxicillin  (AMOXIL ) 500 MG capsule Take 1 capsule (500 mg total) by mouth 2 (two) times daily. 02/28/24   Dennise Kingsley, MD  aspirin  81 MG chewable tablet Chew 81 mg by mouth 2 (two) times daily. Swallow whole.    [provider]  Cetirizine HCl (ZYRTEC ALLERGY) 10 MG CAPS Take 10 mg by mouth in the morning.    [provider]  Cholecalciferol (VITAMIN D3) 50 MCG (2000 UT) capsule Take 2,000 Units by mouth daily.    [provider]  diclofenac (VOLTAREN) 75 MG EC tablet Take 75 mg by mouth 2 (two) times daily.    [provider]  docusate sodium  (COLACE) 100 MG capsule Take 1 capsule (100 mg total) by mouth daily. 09/27/23   Armond Cape, MD  fluconazole  (DIFLUCAN ) 150 MG tablet Take 1 tablet (150 mg total) by mouth daily. Take  72 hours apart 02/28/24   Dennise Kingsley, MD  HYDROmorphone  (DILAUDID ) 2 MG tablet Take 1 tablet (2 mg total) by mouth every 6 (six) hours as needed for severe pain (pain score 7-10). after hip surgery 02/09/24   Deward Eck, PA-C  ipratropium (ATROVENT ) 0.03 % nasal spray Place 1 spray into both nostrils in the morning and at bedtime. 07/23/21   [provider]  liothyronine  (CYTOMEL ) 5 MCG tablet Take 5 mcg by mouth daily. 01/01/24   [provider]  methocarbamol  (ROBAXIN ) 750 MG tablet Take 1 tablet (750 mg total) by mouth every 6 (six) hours as needed for muscle spasms. 02/09/24   Deward Eck, PA-C  metoprolol  tartrate (LOPRESSOR ) 25 MG tablet TAKE 0.5 TABLETS (12.5 MG TOTAL) BY MOUTH 2 (TWO) TIMES DAILY AS NEEDED (SVT FLARE UPS). 12/11/23   Patwardhan, Newman PARAS, MD  ondansetron  (ZOFRAN -ODT) 4 MG disintegrating tablet Take 4 mg by mouth every 8 (eight) hours as needed. 01/16/24   [provider]  pantoprazole  (PROTONIX ) 40  MG tablet Take 40 mg by mouth daily.    [provider]  Prenatal Vit-Fe Fumarate-FA (PRENATAL VITAMIN PO) Take 2 tablets by mouth daily.    [provider]  RABEprazole (ACIPHEX) 20 MG tablet Take 20 mg by mouth daily.    [provider]  sertraline  (ZOLOFT ) 100 MG tablet Take 200 mg by mouth in the morning.    [provider]  traZODone  (DESYREL ) 50 MG tablet Take 100 mg by mouth at bedtime as needed.    [provider]    Physical Exam    Vital Signs:  Julianna ONEIDA Doss does not have vital signs available for review today.  Given telephonic nature of communication, physical exam is limited. AAOx3. NAD. Normal affect.  Speech and respirations are unlabored.  Accessory Clinical Findings    None  Assessment & Plan  1.  Preoperative Cardiovascular Risk Assessment:  According to the Revised Cardiac Risk Index (RCRI), her Perioperative Risk of Major Cardiac Event is (%): 0.4. Her Functional Capacity in METs is: 8.97 according to the Duke Activity Status Index (DASI). Therefore, based on ACC/AHA guidelines, patient would be at acceptable risk for the planned procedure without further cardiovascular testing.   Per office protocol, she may hold Aspirin  for 5-7 days prior to procedure. Please resume Aspirin  as soon as possible postprocedure, at the discretion of the surgeon.   The patient was advised that if she develops new symptoms prior to surgery to contact our office to arrange for a follow-up visit, and she verbalized understanding.  A copy of this note will be routed to requesting surgeon.  Time:   Today, I have spent 5 minutes with the patient with telehealth technology discussing medical history, symptoms, and management plan.     Damien JAYSON Braver, NP  04/03/2024, 2:30 PM

## 2024-04-04 ENCOUNTER — Encounter (HOSPITAL_COMMUNITY): Payer: Self-pay | Admitting: Urology

## 2024-04-04 NOTE — Progress Notes (Signed)
 Spoke w/ via phone for pre-op interview-Patient Lab needs dos-KUB         Lab results------ COVID test --Not indicated-patient states asymptomatic no test needed Arrive at ---0600 NPO after MN  Pre-Surgery Ensure or G2:  Med rec completed Medications to take morning of surgery ---If needed, Dilaudid , Zofran , Tylenol , Zyrtec, Lopressor , Robaxin , Aciphex  Diabetic medication -----  GLP1 agonist last dose: Semaglutide 03-26-2024 GLP1 instructions:  Patient instructed no nail polish to be worn day of surgery Patient instructed to bring photo id and insurance card day of surgery Patient aware to have Driver (ride ) / caregiver    for 24 hours after surgery - spouseGLENWOOD Sharper [phone] Patient Special Instructions --Bring blue folder from [facility], do not wear metal from waist down (including zippers, belt buckles and snaps), do not wear flipflops, sandals, take laxative of choice on Sunday, drink lots of fluid to hydrate and eat light meal that evening. Pre-Op special Instructions -----  Patient verbalized understanding of instructions that were given at this phone interview. Patient denies chest pain, sob, fever, cough at the interview.

## 2024-04-07 ENCOUNTER — Encounter (HOSPITAL_COMMUNITY): Payer: Self-pay | Admitting: Urology

## 2024-04-07 ENCOUNTER — Ambulatory Visit (HOSPITAL_COMMUNITY)

## 2024-04-07 ENCOUNTER — Ambulatory Visit (HOSPITAL_COMMUNITY)
Admission: RE | Admit: 2024-04-07 | Discharge: 2024-04-07 | Disposition: A | Discharge status: Home / Self Care | Attending: Urology | Admitting: Urology

## 2024-04-07 ENCOUNTER — Encounter (HOSPITAL_COMMUNITY)
Admission: RE | Disposition: A | Discharge status: Home / Self Care | Payer: Self-pay | Source: Home / Self Care | Attending: Urology

## 2024-04-07 ENCOUNTER — Other Ambulatory Visit: Payer: Self-pay

## 2024-04-07 DIAGNOSIS — Z7901 Long term (current) use of anticoagulants: Secondary | ICD-10-CM | POA: Insufficient documentation

## 2024-04-07 DIAGNOSIS — Z791 Long term (current) use of non-steroidal anti-inflammatories (NSAID): Secondary | ICD-10-CM | POA: Insufficient documentation

## 2024-04-07 DIAGNOSIS — N200 Calculus of kidney: Secondary | ICD-10-CM | POA: Diagnosis present

## 2024-04-07 DIAGNOSIS — I471 Supraventricular tachycardia, unspecified: Secondary | ICD-10-CM | POA: Diagnosis not present

## 2024-04-07 DIAGNOSIS — E669 Obesity, unspecified: Secondary | ICD-10-CM | POA: Diagnosis not present

## 2024-04-07 HISTORY — PX: EXTRACORPOREAL SHOCK WAVE LITHOTRIPSY: SHX1557

## 2024-04-07 LAB — POCT PREGNANCY, URINE: Preg Test, Ur: NEGATIVE

## 2024-04-07 SURGERY — LITHOTRIPSY, ESWL
Anesthesia: LOCAL | Laterality: Right

## 2024-04-07 MED ORDER — TAMSULOSIN HCL 0.4 MG PO CAPS: 0.4000 mg | ORAL_CAPSULE | Freq: Every day | ORAL | 1 refills | Status: AC

## 2024-04-07 MED ORDER — DIPHENHYDRAMINE HCL 25 MG PO CAPS
25.0000 mg | ORAL_CAPSULE | ORAL | Status: AC
  Administered 2024-04-07: 25 mg via ORAL
  Filled 2024-04-07: qty 1

## 2024-04-07 MED ORDER — DIAZEPAM 5 MG PO TABS
10.0000 mg | ORAL_TABLET | ORAL | Status: AC
  Administered 2024-04-07: 10 mg via ORAL
  Filled 2024-04-07: qty 2

## 2024-04-07 MED ORDER — DIPHENHYDRAMINE HCL 25 MG PO CAPS: 25.0000 mg | ORAL_CAPSULE | Freq: Four times a day (QID) | ORAL | 0 refills | Status: DC | PRN

## 2024-04-07 MED ORDER — DIPHENHYDRAMINE HCL 50 MG/ML IJ SOLN
50.0000 mg | Freq: Once | INTRAMUSCULAR | Status: AC
  Administered 2024-04-07: 50 mg via INTRAVENOUS
  Filled 2024-04-07: qty 1

## 2024-04-07 MED ORDER — CIPROFLOXACIN HCL 500 MG PO TABS
500.0000 mg | ORAL_TABLET | ORAL | Status: AC
  Administered 2024-04-07: 500 mg via ORAL
  Filled 2024-04-07: qty 1

## 2024-04-07 MED ORDER — SODIUM CHLORIDE 0.9 % IV SOLN
INTRAVENOUS | Status: DC
  Administered 2024-04-07: 1000 mL via INTRAVENOUS

## 2024-04-07 MED ORDER — OXYCODONE-ACETAMINOPHEN 5-325 MG PO TABS: 1.0000 | ORAL_TABLET | ORAL | 0 refills | Status: DC | PRN

## 2024-04-07 NOTE — Discharge Instructions (Addendum)
   Activity:  You are encouraged to ambulate frequently (about every hour during waking hours) to help prevent blood clots from forming in your legs or lungs.  However, you should not engage in any heavy lifting (> 10-15 lbs), strenuous activity, or straining.   Diet: You should advance your diet as instructed by your physician.  It will be normal to have some bloating, nausea, and abdominal discomfort intermittently.   Prescriptions:  You will be provided a prescription for pain medication to take as needed.  If your pain is not severe enough to require the prescription pain medication, you may take extra strength Tylenol instead which will have less side effects.  You should also take a prescribed stool softener to avoid straining with bowel movements as the prescription pain medication may constipate you.   What to call us about: You should call the office ([phone]) if you develop fever > 101 or develop persistent vomiting. Activity:  You are encouraged to ambulate frequently (about every hour during waking hours) to help prevent blood clots from forming in your legs or lungs.  However, you should not engage in any heavy lifting (> 10-15 lbs), strenuous activity, or straining.  

## 2024-04-07 NOTE — Op Note (Signed)
[facility] Operative Note  Treating Physician: Tennessee Perra, MD  Pre-op diagnosis: Right renal stone  Post-op diagnosis: Same   Procedure: Right ESWL  See [facility] OP note scanned into chart. Also because of the size, density, location and other factors that cannot be anticipated I feel this will likely be a staged procedure. This fact supersedes any indication in the scanned [facility] operative note to the contrary.  Matt R. Tomothy Eddins MD [facility]  Pager: [phone]   

## 2024-04-07 NOTE — H&P (Signed)
 Urology Preoperative H&P   Chief Complaint: Right renal stone  History of Present Illness: Sarah Saunders is a 48 y.o. female with a right renal stone here for right ESWL. Denies fevers, chills, dysuria. She has a history of SVT and is scheduled for an ablation later this month. She received appropriate cardiac clearance.    Past Medical History:  Diagnosis Date   Allergic rhinitis 02/27/2008   Qualifier: Diagnosis of   By: Krystal MD, Reyes LABOR     IMO SNOMED Dx Update Oct 2024     Anemia    iron  def   Antepartum fetal death    Anxiety    Arthritis    Genital herpes    GERD (gastroesophageal reflux disease)    History of bariatric surgery    History of kidney stones    History of left hip replacement    History of urinary stone    Migraines    Morbid obesity (HCC) 10/09/2016   Pituitary tumor    Polycystic ovary disease    Prolactinoma (HCC)    SVT (supraventricular tachycardia) (HCC)    Vitamin D deficiency     Past Surgical History:  Procedure Laterality Date   ANTERIOR HIP REVISION Right 02/05/2024   Procedure: REVISION, TOTAL ARTHROPLASTY, HIP, ANTERIOR APPROACH;  Surgeon: Beverley Evalene BIRCH, MD;  Location: [facility];  Service: Orthopedics;  Laterality: Right;   CESAREAN SECTION N/A 10/19/2023   Procedure: CESAREAN SECTION;  Surgeon: Herchel Gloris LABOR, MD;  Location: [facility];  Service: Obstetrics;  Laterality: N/A;   CYSTOSCOPY/URETEROSCOPY/HOLMIUM LASER/STENT PLACEMENT Right 07/06/2023   Procedure: CYSTOSCOPY/RIGHT RETROGRADE URETEROSCOPY/HOLMIUM LASER/STENT PLACEMENT;  Surgeon: Selma Donnice SAUNDERS, MD;  Location: [facility];  Service: Urology;  Laterality: Right;   JOINT REPLACEMENT Left 01/13/2021   hip   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)     TUBAL LIGATION     right fallopian tube removed   tube removed      Allergies:  Allergies  Allergen Reactions   Butorphanol Hives and Itching   Other Other (See Comments)    Seasonal allergies     Family History  Problem Relation Age of Onset   Hypertension Father    Heart failure Father    Hypertension Mother    Heart failure Brother     Social History:  reports that she has never smoked. She has never used smokeless tobacco. She reports that she does not drink alcohol  and does not use drugs.  ROS: A complete review of systems was performed.  All systems are negative except for pertinent findings as noted.  Physical Exam:  Vital signs in last 24 hours: Temp:  [97.8 F (36.6 C)] 97.8 F (36.6 C) (08/18 0655) Pulse Rate:  [82] 82 (08/18 0655) Resp:  [16] 16 (08/18 0655) BP: (124)/(88) 124/88 (08/18 0655) SpO2:  [98 %] 98 % (08/18 0655) Weight:  [112.4 kg] 112.4 kg (08/18 0655) Constitutional:  Alert and oriented, No acute distress Cardiovascular: Regular rate and rhythm Respiratory: Normal respiratory effort, Lungs clear bilaterally GI: Abdomen is soft, nontender, nondistended, no abdominal masses GU: No CVA tenderness Lymphatic: No lymphadenopathy Neurologic: Grossly intact, no focal deficits Psychiatric: Normal mood and affect  Laboratory Data:  No results for input(s): WBC, HGB, HCT, PLT in the last 72 hours.  No results for input(s): NA, K, CL, GLUCOSE, BUN, CALCIUM , CREATININE in the last 72 hours.  Invalid input(s): CO3   Results for orders placed or performed during the hospital encounter of  04/07/24 (from the past 24 hours)  Pregnancy, urine POC     Status: None   Collection Time: 04/07/24  6:31 AM  Result Value Ref Range   Preg Test, Ur NEGATIVE NEGATIVE   No results found for this or any previous visit (from the past 240 hours).  Renal Function: No results for input(s): CREATININE in the last 168 hours. CrCl cannot be calculated (Patient's most recent lab result is older than the maximum 21 days allowed.).  Radiologic Imaging: No results found.  I independently reviewed the above imaging studies.  Assessment and  Plan Sarah Saunders is a 48 y.o. female with a right renal stone here for right ESWL.  The risks, benefits and alternatives of right ESWL was discussed with the patient. I described the risks which include arrhythmia, kidney contusion, kidney hemorrhage, need for transfusion, back discomfort, flank ecchymosis, flank abrasion, inability to fracture the stone, inability to pass stone fragments, Steinstrasse, infection associated with obstructing stones, need for an alternative surgical procedure and possible need for repeat shockwave lithotripsy.  The patient voices understanding and wishes to proceed.      Matt R. Lizzette Carbonell MD 04/07/2024, 8:13 AM  [facility] Pager: [phone]

## 2024-04-08 ENCOUNTER — Telehealth (HOSPITAL_COMMUNITY): Payer: Self-pay

## 2024-04-08 ENCOUNTER — Encounter (HOSPITAL_COMMUNITY): Payer: Self-pay | Admitting: Urology

## 2024-04-08 NOTE — Telephone Encounter (Signed)
 Attempted to reach patient to discuss upcoming procedure, no answer after multiple rings. Then received recording stating, call cannot be completed.

## 2024-04-10 ENCOUNTER — Telehealth: Payer: Self-pay | Admitting: Cardiology

## 2024-04-10 DIAGNOSIS — Z01812 Encounter for preprocedural laboratory examination: Secondary | ICD-10-CM

## 2024-04-10 DIAGNOSIS — I471 Supraventricular tachycardia, unspecified: Secondary | ICD-10-CM

## 2024-04-10 NOTE — Telephone Encounter (Signed)
 Patient called in to reschedule 8/26 ablation and 9/25 30-day follow-up with Dr. Inocencio. She says she just got back to work from being out for a hip surgery and she would like to push ablation out if possible. Please advise.

## 2024-04-11 NOTE — Telephone Encounter (Signed)
 Called pt to reschedule her SVT Ablation with Dr. Inocencio.  LM stating that I could offer her end of October but moving it out that far she would need a f/u with EP APP prior to procedure.  I will send MyChart message also to see if we can get her rescheduled.

## 2024-04-14 NOTE — Telephone Encounter (Signed)
 Pt returned my call and she is scheduled for SVT Ablation with Dr. Inocencio on 10/28 at 12:30 pm.   If she needs a f/u appt we will schedule that within 30 days of her procedure so she can have her labs done at the same time.  If she doesn't then she is aware that she will need to come in for labs.

## 2024-04-14 NOTE — Telephone Encounter (Signed)
 Attempted to call pt but her phone is out of service.  Called pt's Husband and LM asking him to give her a message to call me back and left our office number.

## 2024-04-15 NOTE — Telephone Encounter (Signed)
 I spoke with  Sherri (Dr. Inocencio RN) and she stated that the pt would need an In-office visit prior to her procedure on 10/28.  Cassie, can you schedule her to be seen within 30 days of her procedure date? It can be with an EP APP for pre SVT Ablation and she will get labs done that day.

## 2024-04-16 NOTE — Telephone Encounter (Signed)
 Spoke w/ patient - she is scheduled to see Jodie Passey, PA on 10/14.

## 2024-04-22 ENCOUNTER — Ambulatory Visit: Admitting: Internal Medicine

## 2024-04-22 ENCOUNTER — Other Ambulatory Visit: Payer: Self-pay

## 2024-04-22 LAB — BASIC METABOLIC PANEL WITH GFR
BUN/Creatinine Ratio: 20 (ref 9–23)
BUN: 16 mg/dL (ref 6–24)
CO2: 20 mmol/L (ref 20–29)
Calcium: 9.6 mg/dL (ref 8.7–10.2)
Chloride: 107 mmol/L — ABNORMAL HIGH (ref 96–106)
Creatinine, Ser: 0.79 mg/dL (ref 0.57–1.00)
Glucose: 77 mg/dL (ref 70–99)
Potassium: 4.2 mmol/L (ref 3.5–5.2)
Sodium: 142 mmol/L (ref 134–144)
eGFR: 93 mL/min/1.73 (ref >59)

## 2024-04-22 LAB — CBC
Hematocrit: 40.7 % (ref 34.0–46.6)
Hemoglobin: 12.3 g/dL (ref 11.1–15.9)
MCH: 27.1 pg (ref 26.6–33.0)
MCHC: 30.2 g/dL — ABNORMAL LOW (ref 31.5–35.7)
MCV: 90 fL (ref 79–97)
Platelets: 234 x10E3/uL (ref 150–450)
RBC: 4.54 x10E6/uL (ref 3.77–5.28)
RDW: 12.7 % (ref 11.7–15.4)
WBC: 4.2 x10E3/uL (ref 3.4–10.8)

## 2024-05-15 ENCOUNTER — Telehealth: Payer: Self-pay | Admitting: Cardiology

## 2024-05-15 ENCOUNTER — Ambulatory Visit: Admitting: Cardiology

## 2024-05-15 ENCOUNTER — Other Ambulatory Visit: Payer: Self-pay | Admitting: Urology

## 2024-05-15 NOTE — Telephone Encounter (Signed)
   Pre-operative Risk Assessment    Patient Name: Sarah Saunders  DOB: 1976-03-22 MRN: [id number]      Request for Surgical Clearance    Procedure:  Surgery CYSTOSCOPY/URETEROSCOPY/HOLMIUM LASER/STENT PLACEMENT   Date of Surgery:  Clearance 05/19/24                                 Surgeon:  Dr Selma Socks [facility]  Phone number:  [phone] Fax number:  [phone]   Type of Clearance Requested:   - Pharmacy:  Hold Aspirin      Type of Anesthesia:  General    Additional requests/questions:     SignedKerri DELENA Boehringer   05/15/2024, 12:09 PM

## 2024-05-15 NOTE — Telephone Encounter (Signed)
     Primary Cardiologist: Newman JINNY Lawrence, MD  Chart reviewed as part of pre-operative protocol coverage. Given past medical history and time since last visit, based on ACC/AHA guidelines, Sarah Saunders would be at acceptable risk for the planned procedure without further cardiovascular testing.   According to the Revised Cardiac Risk Index (RCRI), her Perioperative Risk of Major Cardiac Event is (%): 0.4. Her Functional Capacity in METs is: 8.97 according to the Duke Activity Status Index (DASI). Therefore, based on ACC/AHA guidelines, patient would be at acceptable risk for the planned procedure without further cardiovascular testing.    Per office protocol, she may hold Aspirin  for 5-7 days prior to procedure. Please resume Aspirin  as soon as possible postprocedure, at the discretion of the surgeon.  I will route this recommendation to the requesting party via Epic fax function and remove from pre-op pool.  Please call with questions.  [name]. Jora Galluzzo NP-C    05/15/2024, 12:52 PM [facility] [address] [place], [place] [address] Office [phone]

## 2024-05-16 ENCOUNTER — Other Ambulatory Visit: Payer: Self-pay

## 2024-05-16 ENCOUNTER — Encounter (HOSPITAL_COMMUNITY): Payer: Self-pay | Admitting: Urology

## 2024-05-16 NOTE — Progress Notes (Signed)
 Anesthesia Chart Review   Case: [id number] Date/Time: 05/19/24 1400   Procedures:      CYSTOSCOPY/URETEROSCOPY/HOLMIUM LASER/STENT PLACEMENT (Right)     CYSTOSCOPY, WITH RETROGRADE PYELOGRAM (Right)   Anesthesia type: General   Diagnosis: Calculus, renal [N20.0]   Pre-op diagnosis: LEFT RENAL CALCULI   [facility] ROOM 03 [facility]   Surgeons: Selma Donnice SAUNDERS, MD       DISCUSSION:48 y.o. never smoker with h/o SVT, GERD, left renal calculi scheduled for above procedure 05/19/2024 with Dr. Donnice Selma.   Per cardiology preoperative evaluation 05/15/2024, Chart reviewed as part of pre-operative protocol coverage. Given past medical history and time since last visit, based on ACC/AHA guidelines, Raelee T Budney would be at acceptable risk for the planned procedure without further cardiovascular testing.    According to the Revised Cardiac Risk Index (RCRI), her Perioperative Risk of Major Cardiac Event is (%): 0.4. Her Functional Capacity in METs is: 8.97 according to the Duke Activity Status Index (DASI). Therefore, based on ACC/AHA guidelines, patient would be at acceptable risk for the planned procedure without further cardiovascular testing.    Per office protocol, she may hold Aspirin  for 5-7 days prior to procedure. Please resume Aspirin  as soon as possible postprocedure, at the discretion of the surgeon.  VS: Ht 5' 7.5 (1.715 m)   Wt 115.7 kg   BMI 39.35 kg/m   PROVIDERS: Verdia Lombard, MD is PCP   Primary Cardiologist: Newman JINNY Lawrence, MD LABS: Labs reviewed: Acceptable for surgery. (all labs ordered are listed, but only abnormal results are displayed)  Labs Reviewed - No data to display   IMAGES:   EKG:   CV: Echo 09/11/23 1. Left ventricular ejection fraction, by estimation, is 55 to 60%. Left  ventricular ejection fraction by 3D volume is 60 %. The left ventricle has  normal function. The left ventricle has no regional wall motion  abnormalities. Left  ventricular diastolic   parameters were normal. The average left ventricular global longitudinal  strain is -20.4 %. The global longitudinal strain is normal.   2. Right ventricular systolic function is normal. The right ventricular  size is normal.   3. The mitral valve is normal in structure. No evidence of mitral valve  regurgitation. No evidence of mitral stenosis.   4. The aortic valve is tricuspid. Aortic valve regurgitation is not  visualized. No aortic stenosis is present.   5. Moderately dilated pulmonary artery.   6. The inferior vena cava is normal in size with greater than 50%  respiratory variability, suggesting right atrial pressure of 3 mmHg.   Comparison(s): Unable to view 2023 study for comparison.    Exercise treadmill stress test 03/31/2019:   Indication: Tachycardia   The patient exercised on Bruce protocol for  9:00 min. Patient achieved  10.16 METS and reached HR  171 bpm, which is   95% of maximum age-predicted HR.  Stress test terminated due to Fatigue.   Exercise capacity was normal. HR Response to Exercise: Appropriate. BP Response to Exercise: Normal resting BP- appropriate response. Chest Pain: none.Arrhythmias: none. Resting EKG demonstrates Normal sinus rhythm. ST Changes: With peak exercise there was no ST-T changes of ischemia.   Overall Impression: Normal stress test. Recommendations: Continue primary/secondary prevention. Past Medical History:  Diagnosis Date   Allergic rhinitis 02/27/2008   Qualifier: Diagnosis of   By: Krystal MD, Reyes LABOR     IMO SNOMED Dx Update Oct 2024     Anemia    iron   def   Antepartum fetal death    Anxiety    Arthritis    Genital herpes    GERD (gastroesophageal reflux disease)    History of bariatric surgery    History of kidney stones    History of left hip replacement    History of urinary stone    Migraines    Morbid obesity (HCC) 10/09/2016   Pituitary tumor    Polycystic ovary disease    Prolactinoma  (HCC)    SVT (supraventricular tachycardia)    Vitamin D deficiency     Past Surgical History:  Procedure Laterality Date   ANTERIOR HIP REVISION Right 02/05/2024   Procedure: REVISION, TOTAL ARTHROPLASTY, HIP, ANTERIOR APPROACH;  Surgeon: Beverley Evalene BIRCH, MD;  Location[facility];  Service: Orthopedics;  Laterality: Right;   CESAREAN SECTION N/A 10/19/2023   Procedure: CESAREAN SECTION;  Surgeon: Herchel Gloris LABOR, MD;  Location: [facility];  Service: Obstetrics;  Laterality: N/A;   CYSTOSCOPY/URETEROSCOPY/HOLMIUM LASER/STENT PLACEMENT Right 07/06/2023   Procedure: CYSTOSCOPY/RIGHT RETROGRADE URETEROSCOPY/HOLMIUM LASER/STENT PLACEMENT;  Surgeon: Selma Donnice SAUNDERS, MD;  Location: [facility];  Service: Urology;  Laterality: Right;   EXTRACORPOREAL SHOCK WAVE LITHOTRIPSY Right 04/07/2024   Procedure: LITHOTRIPSY, ESWL;  Surgeon: Selma Donnice SAUNDERS, MD;  Location: [facility];  Service: Urology;  Laterality: Right;   JOINT REPLACEMENT Left 01/13/2021   hip   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)     TOTAL HIP ARTHROPLASTY Right    tube removed     right fallopian tube removed    MEDICATIONS: No current facility-administered medications for this encounter.    acetaminophen  (TYLENOL ) 325 MG tablet   albuterol  (PROVENTIL  HFA;VENTOLIN  HFA) 108 (90 BASE) MCG/ACT inhaler   amoxicillin  (AMOXIL ) 500 MG capsule   aspirin  81 MG chewable tablet   Cetirizine HCl (ZYRTEC ALLERGY) 10 MG CAPS   Cholecalciferol (VITAMIN D3) 50 MCG (2000 UT) capsule   diclofenac (VOLTAREN) 75 MG EC tablet   diphenhydrAMINE  (BENADRYL  ALLERGY) 25 mg capsule   docusate sodium  (COLACE) 100 MG capsule   fluconazole  (DIFLUCAN ) 150 MG tablet   HYDROmorphone  (DILAUDID ) 2 MG tablet   ipratropium (ATROVENT ) 0.03 % nasal spray   liothyronine  (CYTOMEL ) 5 MCG tablet   methocarbamol  (ROBAXIN ) 750 MG tablet   metoprolol  tartrate (LOPRESSOR ) 25 MG tablet   ondansetron  (ZOFRAN -ODT) 4 MG disintegrating tablet    oxyCODONE -acetaminophen  (PERCOCET) 5-325 MG tablet   pantoprazole  (PROTONIX ) 40 MG tablet   Prenatal Vit-Fe Fumarate-FA (PRENATAL VITAMIN PO)   RABEprazole (ACIPHEX) 20 MG tablet   semaglutide-weight management (WEGOVY) 0.5 MG/0.5ML SOAJ SQ injection   sertraline  (ZOLOFT ) 100 MG tablet   tamsulosin  (FLOMAX ) 0.4 MG CAPS capsule   traZODone  (DESYREL ) 50 MG tablet     [facility] Ward, PA-C [facility]Surgical Testing [phone]

## 2024-05-16 NOTE — Progress Notes (Signed)
 For Anesthesia: PCP - Lequita Flor, MD  Cardiologist - Newman JINNY Lawrence, MD Emelia [name], NP cardiac clearance note 05/15/24  Bowel Prep reminder: N/A  Chest x-ray - 02/08/24 in [facility] EKG - 03/05/24 in [facility] Stress Test - 04/06/2019 in [facility] ECHO - 09/11/23 in [facility] Cardiac Cath - N/A Pacemaker/ICD device last checked: N/A Pacemaker orders received: N/A Device Rep notified: N/A  Spinal Cord Stimulator: N/A  Sleep Study - Yes CPAP - No  Fasting Blood Sugar - N/A Checks Blood Sugar __N/A___ times a day Date and result of last Hgb A1c-N/A  Last dose of GLP1 agonist- N/A GLP1 instructions: Hold 7 days prior to schedule (Hold 24 hours-daily)   Last dose of SGLT-2 inhibitors- N/A SGLT-2 instructions: Hold 72 hours prior to surgery  Blood Thinner Instructions:N/A Last Dose:N/A Time last taken:N/A  Aspirin  Instructions: N/A Last Dose: 03/20/24 Time last taken:  Activity level: Can go up a flight of stairs and activities of daily living without stopping and without chest pain and/or shortness of breath    Anesthesia review: SVT  Patient denies shortness of breath, fever, cough and chest pain at PAT appointment   Patient verbalized understanding of instructions that were reviewed over the telephone.

## 2024-05-19 ENCOUNTER — Ambulatory Visit (HOSPITAL_COMMUNITY): Payer: Self-pay | Admitting: Physician Assistant

## 2024-05-19 ENCOUNTER — Encounter (HOSPITAL_COMMUNITY): Payer: Self-pay | Admitting: Urology

## 2024-05-19 ENCOUNTER — Ambulatory Visit (HOSPITAL_COMMUNITY)
Admission: RE | Admit: 2024-05-19 | Discharge: 2024-05-19 | Disposition: A | Discharge status: Home / Self Care | Attending: Urology | Admitting: Urology

## 2024-05-19 ENCOUNTER — Other Ambulatory Visit: Payer: Self-pay

## 2024-05-19 ENCOUNTER — Encounter (HOSPITAL_COMMUNITY)
Admission: RE | Disposition: A | Discharge status: Home / Self Care | Payer: Self-pay | Source: Home / Self Care | Attending: Urology

## 2024-05-19 ENCOUNTER — Ambulatory Visit (HOSPITAL_COMMUNITY)

## 2024-05-19 DIAGNOSIS — N132 Hydronephrosis with renal and ureteral calculous obstruction: Secondary | ICD-10-CM | POA: Diagnosis not present

## 2024-05-19 DIAGNOSIS — K219 Gastro-esophageal reflux disease without esophagitis: Secondary | ICD-10-CM | POA: Diagnosis not present

## 2024-05-19 DIAGNOSIS — N201 Calculus of ureter: Secondary | ICD-10-CM

## 2024-05-19 DIAGNOSIS — N200 Calculus of kidney: Secondary | ICD-10-CM | POA: Diagnosis present

## 2024-05-19 HISTORY — PX: CYSTOSCOPY W/ RETROGRADES: SHX1426

## 2024-05-19 HISTORY — PX: CYSTOSCOPY/URETEROSCOPY/HOLMIUM LASER/STENT PLACEMENT: SHX6546

## 2024-05-19 LAB — POCT PREGNANCY, URINE: Preg Test, Ur: NEGATIVE

## 2024-05-19 SURGERY — CYSTOSCOPY/URETEROSCOPY/HOLMIUM LASER/STENT PLACEMENT
Anesthesia: General | Laterality: Right

## 2024-05-19 MED ORDER — IOHEXOL 300 MG/ML  SOLN
INTRAMUSCULAR | Status: DC | PRN
  Administered 2024-05-19: 5 mL

## 2024-05-19 MED ORDER — PROPOFOL 10 MG/ML IV BOLUS
INTRAVENOUS | Status: DC | PRN
  Administered 2024-05-19: 200 mg via INTRAVENOUS

## 2024-05-19 MED ORDER — ORAL CARE MOUTH RINSE: 15.0000 mL | Freq: Once | OROMUCOSAL | Status: AC

## 2024-05-19 MED ORDER — PROMETHAZINE (PHENERGAN) 6.25MG IN NS 50ML IVPB
6.2500 mg | Freq: Once | INTRAVENOUS | Status: DC
  Filled 2024-05-19: qty 50

## 2024-05-19 MED ORDER — CEFAZOLIN SODIUM-DEXTROSE 2-4 GM/100ML-% IV SOLN: 2.0000 g | INTRAVENOUS | Status: DC

## 2024-05-19 MED ORDER — MIDAZOLAM HCL 5 MG/5ML IJ SOLN
INTRAMUSCULAR | Status: DC | PRN
  Administered 2024-05-19: 2 mg via INTRAVENOUS

## 2024-05-19 MED ORDER — ACETAMINOPHEN 500 MG PO TABS
1000.0000 mg | ORAL_TABLET | Freq: Once | ORAL | Status: AC
  Administered 2024-05-19: 1000 mg via ORAL
  Filled 2024-05-19: qty 2

## 2024-05-19 MED ORDER — FENTANYL CITRATE (PF) 100 MCG/2ML IJ SOLN
INTRAMUSCULAR | Status: AC
  Filled 2024-05-19: qty 2

## 2024-05-19 MED ORDER — OXYCODONE HCL 5 MG PO TABS: 5.0000 mg | ORAL_TABLET | Freq: Once | ORAL | Status: DC | PRN

## 2024-05-19 MED ORDER — FENTANYL CITRATE PF 50 MCG/ML IJ SOSY: 25.0000 ug | PREFILLED_SYRINGE | INTRAMUSCULAR | Status: DC | PRN

## 2024-05-19 MED ORDER — KETOROLAC TROMETHAMINE 30 MG/ML IJ SOLN
INTRAMUSCULAR | Status: AC
  Filled 2024-05-19: qty 1

## 2024-05-19 MED ORDER — DEXAMETHASONE SODIUM PHOSPHATE 10 MG/ML IJ SOLN
INTRAMUSCULAR | Status: AC
  Filled 2024-05-19: qty 1

## 2024-05-19 MED ORDER — PROPOFOL 10 MG/ML IV BOLUS
INTRAVENOUS | Status: AC
  Filled 2024-05-19: qty 20

## 2024-05-19 MED ORDER — OXYCODONE-ACETAMINOPHEN 5-325 MG PO TABS: 1.0000 | ORAL_TABLET | ORAL | 0 refills | Status: DC | PRN

## 2024-05-19 MED ORDER — EPHEDRINE SULFATE-NACL 50-0.9 MG/10ML-% IV SOSY
PREFILLED_SYRINGE | INTRAVENOUS | Status: DC | PRN
  Administered 2024-05-19: 5 mg via INTRAVENOUS

## 2024-05-19 MED ORDER — LACTATED RINGERS IV SOLN: INTRAVENOUS | Status: DC

## 2024-05-19 MED ORDER — DEXAMETHASONE SODIUM PHOSPHATE 10 MG/ML IJ SOLN
INTRAMUSCULAR | Status: DC | PRN
  Administered 2024-05-19: 10 mg via INTRAVENOUS

## 2024-05-19 MED ORDER — PROMETHAZINE HCL 25 MG/ML IJ SOLN: 6.2500 mg | Freq: Four times a day (QID) | INTRAMUSCULAR | Status: DC | PRN

## 2024-05-19 MED ORDER — 0.9 % SODIUM CHLORIDE (POUR BTL) OPTIME
TOPICAL | Status: DC | PRN
Start: 2024-05-19 — End: 2024-05-19
  Administered 2024-05-19: 1000 mL

## 2024-05-19 MED ORDER — OXYCODONE HCL 5 MG/5ML PO SOLN: 5.0000 mg | Freq: Once | ORAL | Status: DC | PRN

## 2024-05-19 MED ORDER — AMISULPRIDE (ANTIEMETIC) 5 MG/2ML IV SOLN
10.0000 mg | Freq: Once | INTRAVENOUS | Status: AC | PRN
  Administered 2024-05-19: 10 mg via INTRAVENOUS

## 2024-05-19 MED ORDER — EPHEDRINE 5 MG/ML INJ
INTRAVENOUS | Status: AC
Start: 2024-05-19 — End: 2024-05-19
  Filled 2024-05-19: qty 5

## 2024-05-19 MED ORDER — ONDANSETRON HCL 4 MG/2ML IJ SOLN
INTRAMUSCULAR | Status: AC
  Filled 2024-05-19: qty 2

## 2024-05-19 MED ORDER — FENTANYL CITRATE (PF) 100 MCG/2ML IJ SOLN
INTRAMUSCULAR | Status: DC | PRN
  Administered 2024-05-19 (×2): 25 ug via INTRAVENOUS
  Administered 2024-05-19: 50 ug via INTRAVENOUS

## 2024-05-19 MED ORDER — KETOROLAC TROMETHAMINE 30 MG/ML IJ SOLN
30.0000 mg | Freq: Once | INTRAMUSCULAR | Status: AC | PRN
  Administered 2024-05-19: 30 mg via INTRAVENOUS

## 2024-05-19 MED ORDER — AMISULPRIDE (ANTIEMETIC) 5 MG/2ML IV SOLN
INTRAVENOUS | Status: AC
  Filled 2024-05-19: qty 4

## 2024-05-19 MED ORDER — LIDOCAINE HCL (PF) 2 % IJ SOLN
INTRAMUSCULAR | Status: AC
Start: 2024-05-19 — End: 2024-05-19
  Filled 2024-05-19: qty 5

## 2024-05-19 MED ORDER — CHLORHEXIDINE GLUCONATE 0.12 % MT SOLN
15.0000 mL | Freq: Once | OROMUCOSAL | Status: AC
  Administered 2024-05-19: 15 mL via OROMUCOSAL

## 2024-05-19 MED ORDER — CEFAZOLIN SODIUM-DEXTROSE 2-4 GM/100ML-% IV SOLN
2.0000 g | INTRAVENOUS | Status: AC
  Administered 2024-05-19: 2 g via INTRAVENOUS
  Filled 2024-05-19: qty 100

## 2024-05-19 MED ORDER — ONDANSETRON HCL 4 MG/2ML IJ SOLN
INTRAMUSCULAR | Status: DC | PRN
  Administered 2024-05-19: 4 mg via INTRAVENOUS

## 2024-05-19 MED ORDER — MIDAZOLAM HCL 2 MG/2ML IJ SOLN
INTRAMUSCULAR | Status: AC
  Filled 2024-05-19: qty 2

## 2024-05-19 MED ORDER — LIDOCAINE HCL (CARDIAC) PF 100 MG/5ML IV SOSY
PREFILLED_SYRINGE | INTRAVENOUS | Status: DC | PRN
  Administered 2024-05-19: 60 mg via INTRAVENOUS

## 2024-05-19 MED ORDER — SODIUM CHLORIDE 0.9 % IR SOLN
Status: DC | PRN
  Administered 2024-05-19: 3000 mL via INTRAVESICAL

## 2024-05-19 SURGICAL SUPPLY — 25 items
BAG URO CATCHER STRL LF (MISCELLANEOUS) ×1 IMPLANT
BASKET ZERO TIP NITINOL 2.4FR (BASKET) IMPLANT
BENZOIN TINCTURE PRP APPL 2/3 (GAUZE/BANDAGES/DRESSINGS) IMPLANT
BULB IRRIG PATHFIND (MISCELLANEOUS) IMPLANT
CATH URETERAL DUAL LUMEN 10F (MISCELLANEOUS) IMPLANT
CATH URETL OPEN 5X70 (CATHETERS) ×1 IMPLANT
CLOTH BEACON ORANGE TIMEOUT ST (SAFETY) ×1 IMPLANT
DRSG TEGADERM 2-3/8X2-3/4 SM (GAUZE/BANDAGES/DRESSINGS) IMPLANT
FIBER LASER MOSES 200 DFL (Laser) IMPLANT
GLOVE BIOGEL M 7.0 STRL (GLOVE) ×1 IMPLANT
GOWN STRL REUS W/ TWL XL LVL3 (GOWN DISPOSABLE) ×1 IMPLANT
GUIDEWIRE STR DUAL SENSOR (WIRE) ×2 IMPLANT
GUIDEWIRE ZIPWRE .038 STRAIGHT (WIRE) IMPLANT
KIT TURNOVER KIT A (KITS) ×1 IMPLANT
MANIFOLD NEPTUNE II (INSTRUMENTS) ×1 IMPLANT
NS IRRIG 1000ML POUR BTL (IV SOLUTION) IMPLANT
PACK CYSTO (CUSTOM PROCEDURE TRAY) ×1 IMPLANT
PAD PREP 24X48 CUFFED NSTRL (MISCELLANEOUS) ×1 IMPLANT
SHEATH DILATOR SET 8/10 (MISCELLANEOUS) IMPLANT
SHEATH NAVIGATOR HD 11/13X28 (SHEATH) IMPLANT
SHEATH NAVIGATOR HD 11/13X36 (SHEATH) IMPLANT
STENT URET 6FRX26 CONTOUR (STENTS) IMPLANT
TRACTIP FLEXIVA PULS ID 200XHI (Laser) IMPLANT
TUBING CONNECTING 10 (TUBING) ×1 IMPLANT
TUBING UROLOGY SET (TUBING) ×1 IMPLANT

## 2024-05-19 NOTE — Anesthesia Preprocedure Evaluation (Addendum)
 Anesthesia Evaluation  Patient identified by MRN, date of birth, ID band Patient awake    Reviewed: Allergy & Precautions, NPO status , Patient's Chart, lab work & pertinent test results  Airway Mallampati: II  TM Distance: >3 FB Neck ROM: Full    Dental  (+) Missing   Pulmonary neg pulmonary ROS   Pulmonary exam normal        Cardiovascular negative cardio ROS Normal cardiovascular exam     Neuro/Psych  Headaches  Anxiety        GI/Hepatic Neg liver ROS,GERD  Medicated and Controlled,,  Endo/Other  Patient on GLP-1 Agonist  Renal/GU      Musculoskeletal   Abdominal  (+) + obese  Peds  Hematology negative hematology ROS (+)   Anesthesia Other Findings LEFT RENAL CALCULI  Reproductive/Obstetrics Hcg negative                              Anesthesia Physical Anesthesia Plan  ASA: 3  Anesthesia Plan: General   Post-op Pain Management:    Induction: Intravenous  PONV Risk Score and Plan: 3 and Ondansetron , Dexamethasone , Midazolam  and Treatment may vary due to age or medical condition  Airway Management Planned: LMA  Additional Equipment:   Intra-op Plan:   Post-operative Plan: Extubation in OR  Informed Consent: I have reviewed the patients History and Physical, chart, labs and discussed the procedure including the risks, benefits and alternatives for the proposed anesthesia with the patient or authorized representative who has indicated his/her understanding and acceptance.     Dental advisory given  Plan Discussed with: CRNA  Anesthesia Plan Comments:          Anesthesia Quick Evaluation

## 2024-05-19 NOTE — Anesthesia Postprocedure Evaluation (Signed)
 Anesthesia Post Note  Patient: Paticia T Babin  Procedure(s) Performed: CYSTOSCOPY/URETEROSCOPY/HOLMIUM LASER/STENT PLACEMENT (Right) CYSTOSCOPY, WITH RETROGRADE PYELOGRAM (Right)     Patient location during evaluation: PACU Anesthesia Type: General Level of consciousness: awake Pain management: pain level controlled Vital Signs Assessment: post-procedure vital signs reviewed and stable Respiratory status: spontaneous breathing, nonlabored ventilation and respiratory function stable Cardiovascular status: blood pressure returned to baseline and stable Postop Assessment: no apparent nausea or vomiting Anesthetic complications: no   No notable events documented.  Last Vitals:  Vitals:   05/19/24 1730 05/19/24 1735  BP: 138/88   Pulse: (!) 58 66  Resp: 12 15  Temp:    SpO2: 100% 100%    Last Pain:  Vitals:   05/19/24 1700  TempSrc:   PainSc: 0-No pain                 Jameeka Marcy P Omeed Osuna

## 2024-05-19 NOTE — Anesthesia Procedure Notes (Signed)
 Procedure Name: LMA Insertion Date/Time: 05/19/2024 2:44 PM  Performed by: Nada Corean CROME, CRNAPre-anesthesia Checklist: Emergency Drugs available, Patient identified, Suction available, Patient being monitored and Timeout performed Patient Re-evaluated:Patient Re-evaluated prior to induction Oxygen Delivery Method: Circle system utilized Preoxygenation: Pre-oxygenation with 100% oxygen Induction Type: IV induction Ventilation: Mask ventilation without difficulty LMA: LMA inserted LMA Size: 4.0 Tube type: Oral Number of attempts: 1 Placement Confirmation: positive ETCO2 and breath sounds checked- equal and bilateral Tube secured with: Tape Dental Injury: Teeth and Oropharynx as per pre-operative assessment

## 2024-05-19 NOTE — Op Note (Signed)
 Operative Note  Preoperative diagnosis:  1.  Right renal and ureteral stones  Postoperative diagnosis: 1.  Right renal and ureteral stones  Procedure(s): 1.  Cystoscopy 2. Right ureteroscopy with laser lithotripsy and basket extraction of stones 3. Right retrograde pyelogram 4. Right ureteral stent placement 5. Fluoroscopy with intraoperative interpretation  Surgeon: Donnice Siad, MD  Assistants:  None  Anesthesia:  General  Complications:  None  EBL:  Minimal  Specimens: 1. Stones for stone analysis (to be done at [facility])  Drains/Catheters: 1.  Right 6Fr x 26cm ureteral stent with a tether string  Intraoperative findings:   Cystoscopy demonstrated no suspicious bladder lesions. Right retrograde pyelogram demonstrated no hydronephrosis, extravasation contrast no filling defects. Right ureteroscopy demonstrated approximately 7 x 5 mm distal right ureteral stone that was fragmented by distracted.  This also revealed approximately 1.5 cm total stone burden in the right lower pole.  All the stones were fragmented and basket extracted. Successful right ureteral stent placement.  Indication:  Sarah Saunders is a 48 y.o. female with history of urolithiasis who underwent ESWL with persistent stones.  She presents today for definitive ureteroscopy with laser lithotripsy and extraction of stone.  Description of procedure: After informed consent was obtained from the patient, the patient was identified and taken to the operating room and placed in the supine position.  General anesthesia was administered as well as perioperative IV antibiotics.  At the beginning of the case, a time-out was performed to properly identify the patient, the surgery to be performed, and the surgical site.  Sequential compression devices were applied to the lower extremities at the beginning of the case for DVT prophylaxis.  The patient was then placed in the dorsal lithotomy supine position,  prepped and draped in sterile fashion.   We then passed the 21-French rigid cystoscope through the urethra and into the bladder under vision without any difficulty, noting a normal urethra without strictures.   A systematic evaluation of the bladder revealed no evidence of any suspicious bladder lesions.  Ureteral orifices were in normal position.    Under cystoscopic and flouroscopic guidance, we cannulated the right ureteral orifice with a 5-French open-ended ureteral catheter and a gentle retrograde pyelogram was performed, revealing a normal caliber ureter without any filling defects. There was no hydronephrosis of the collecting system. A 0.038 sensor wire was then passed up to the level of the renal pelvis and secured to the drape as a safety wire. The ureteral catheter and cystoscope were removed, leaving the safety wire in place.   A semi-rigid ureteroscope was passed alongside the wire up the distal ureter which appeared normal.  I encountered approximately 7 x 5 mm distal right ureteral stone.  Using 200 m holmium laser fiber, the stone was fragmented and passed extracted.  I advanced the ureteroscope into the proximal ureter with no further stones.  I then advanced a flexible ureteroscope into the kidney and surveyed the kidney identified right lower pole stones about 1.5 cm total stone burden.  A second 0.038 sensor wire was passed under direct vision and the semirigid scope was removed. An 11/13Fr ureteral access sheath was carefully advanced up the ureter to the level of the UPJ over this wire under fluoroscopic guidance. The flexible ureteroscope was advanced into the collecting system via the access sheath. The collecting system was inspected. Using the 272 micron holmium laser fiber, the stones were fragmented completely. A 2.2 Fr zero tip basket was used to remove the fragments  under visual guidance. These were sent for chemical analysis. With the ureteroscope in the kidney, a gentle  pyelogram was performed to delineate the calyceal system and we evaluated the calyces systematically. We encountered no further stones. The rest of the stone fragments were very tiny and these were  irrigated away gently. The calyces were re-inspected and there were no significant stone fragment residual.   We then withdrew the ureteroscope back down the ureter along with the access sheath, noting no evidence of any stones along the course of the ureter.  Prior to removing the ureteroscope, we did pass the Glidewire back up to the ureter to the renal pelvis.  Once the ureteroscope was removed, we then used the Glidewire under fluoroscopic guidance and passed up a 6-French x 26 cm double-pigtail ureteral stent up the ureter, making sure that the proximal and distal ends coiled within the kidney and bladder respectively.  Note that we left a long tether string attached to the distal end of the ureteral stent and it exited the urethral meatus and was secured to the inner thigh with a tegaderm adhesive.  The cystoscope was then advanced back into the bladder under vision.  We were able to see the distal stent coiling nicely within the bladder.  The bladder was then emptied with irrigation solution.  The cystoscope was then removed.    The patient tolerated the procedure well and there was no complication. Patient was awoken from anesthesia and taken to the recovery room in stable condition. I was present and scrubbed for the entirety of the case.  Plan:  Patient will be discharged home.  She will remove stent in 3 days by pulling Ontak string.  She will follow-up in 1 month with renal ultrasound prior.  Matt R. Tamana Hatfield MD [facility]  Pager: [phone]

## 2024-05-19 NOTE — H&P (Signed)
 Office Visit Report     05/08/2024   --------------------------------------------------------------------------------   Sarah Saunders  MRN: [id number]  DOB: 1976/05/14, 48 year old Female  SSN:    PRIMARY CARE:  Lequita Flor, MD  PRIMARY CARE FAX:  [phone]  REFERRING:    PROVIDER:  Donnice Siad, M.D.  TREATING:  Ubaldo Eagles, NP  LOCATION:  [facility] [phone]     --------------------------------------------------------------------------------   CC/HPI: Sarah Saunders female send follow-up history of urolithiasis.   1. Urolithiasis:  -She was [redacted] weeks pregnant and present with a right-sided flank pain in 06/2023. Low-dose CT scan revealed 8 mm distal right ureteral stone with moderate hydronephrosis causing obstruction. She underwent right ureteroscopy with laser lithotripsy and basket traction of stone on 07/06/2023. She tolerated well. Her stent was removed. Stone analysis resulted calcium  oxalate. CT imaging also revealed right renal stones including 1.2 cm right renal stone and 6 mm right renal stone.  - KUB 03/26/2024 with approximately 1.2 cm right renal stone. No other obvious calcifications identified.  -She denies any abdominal pain or flank pain.   04/25/2024: Patient returns for follow-up evaluation after undergoing shockwave lithotripsy to treat a previous identified large 1+ centimeter right renal stone on 8/18. She had some expected pain and soreness following the procedure but that is since resolved. She denies any interval stone material passage. She is not having any bothersome LUTS at present time including absence of any dysuria or gross hematuria. No interval fevers or chills, nausea/vomiting.   05/08/2024: Returns for repeat exam. KUB showed fractured stone but no big decrease in size following shockwave lithotripsy on 8/18, no definitive calculi along the course of the ureter. She was asymptomatic. Today she endorses passing a tiny stone  in the interval but has not had any pain or discomfort. No new or worsening LUTS including absence of any dysuria or gross hematuria. No interval fevers or chills, nausea/vomiting.     ALLERGIES: Stadol - Hives    MEDICATIONS: Diflucan  150 MG Tablet 1 tablet PO Q1WK  Percocet 5-325 MG Tablet 1-2 tablet PO Q 8 H PRN  Aciphex 20 MG Tablet Delayed Release  Amoxicillin  500 MG Tablet  Bromocriptine  Mesylate 5 MG Capsule  Diclofenac  Glucosamine  Iron   Multivitamin  Pregnitude 2000-200 MG-MCG Packet  traZODone  HCl 50 MG Tablet  Vitamin A  Vitamin C 500 MG Capsule  Vitamin D2  Zoloft      GU PSH: Cysto Remove Stent FB Sim - 2022     NON-GU PSH: Visit Complexity (formerly GPC1X) - 08/02/2023     GU PMH: Renal calculus - 04/25/2024, - 03/26/2024, - 08/02/2023 Ureteral calculus - 2022, - 2021 Nocturia - 2021 Urinary Frequency - 2021    NON-GU PMH: Arthritis Depression GERD    FAMILY HISTORY: Diabetes - Runs in Family Hypertension - Runs in Family Kidney Stones - Runs in Family   SOCIAL HISTORY: Marital Status: Married Current Smoking Status: Patient has never smoked.   Tobacco Use Assessment Completed: Used Tobacco in last 30 days? Does not use smokeless tobacco. Has never drank.  Drinks 2 caffeinated drinks per day.    REVIEW OF SYSTEMS:    GU Review Female:   Patient denies frequent urination, hard to postpone urination, burning /pain with urination, get up at night to urinate, leakage of urine, stream starts and stops, trouble starting your stream, have to strain to urinate, and being pregnant.  Gastrointestinal (Upper):   Patient denies nausea, vomiting, and indigestion/ heartburn.  Gastrointestinal (Lower):   Patient denies diarrhea and constipation.  Constitutional:   Patient denies fever, night sweats, weight loss, and fatigue.  Skin:   Patient denies skin rash/ lesion and itching.  Eyes:   Patient denies blurred vision and double vision.  Ears/ Nose/ Throat:    Patient denies sore throat and sinus problems.  Hematologic/Lymphatic:   Patient denies swollen glands and easy bruising.  Cardiovascular:   Patient denies leg swelling and chest pains.  Respiratory:   Patient denies cough and shortness of breath.  Endocrine:   Patient denies excessive thirst.  Musculoskeletal:   Patient denies joint pain and back pain.  Neurological:   Patient denies headaches and dizziness.  Psychologic:   Patient denies depression and anxiety.   VITAL SIGNS: None   MULTI-SYSTEM PHYSICAL EXAMINATION:    Constitutional: Well-nourished. No physical deformities. Normally developed. Good grooming.  Neck: Neck symmetrical, not swollen. Normal tracheal position.  Respiratory: No labored breathing, no use of accessory muscles.   Cardiovascular: Normal temperature, normal extremity pulses, no swelling, no varicosities.  Skin: No paleness, no jaundice, no cyanosis. No lesion, no ulcer, no rash.  Neurologic / Psychiatric: Oriented to time, oriented to place, oriented to person. No depression, no anxiety, no agitation.  Musculoskeletal: Normal gait and station of head and neck.     Complexity of Data:  Source Of History:  Patient, Medical Record Summary  Records Review:   Previous Doctor Records, Previous Hospital Records, Previous Patient Records  Urine Test Review:   Urinalysis, Urine Culture  X-Ray Review: KUB: Reviewed Films. Discussed With Patient.     05/08/24  Urinalysis  Urine Appearance Cloudy   Urine Color Yellow   Urine Glucose Neg mg/dL  Urine Bilirubin Neg mg/dL  Urine Ketones Neg mg/dL  Urine Specific Gravity 1.025   Urine Blood 3+ ery/uL  Urine pH 6.0   Urine Protein Neg mg/dL  Urine Urobilinogen 0.2 mg/dL  Urine Nitrites Neg   Urine Leukocyte Esterase 3+ leu/uL  Urine WBC/hpf 10 - 20/hpf   Urine RBC/hpf 10 - 20/hpf   Urine Epithelial Cells 6 - 10/hpf   Urine Bacteria Mod (26-50/hpf)   Urine Mucous Not Present   Urine Yeast Few (1 - 5/hpf)   Urine  Trichomonas Not Present   Urine Cystals Ca Oxalate   Urine Casts NS (Not Seen)   Urine Sperm Not Present    PROCEDURES:         KUB - 25981  A single view of the abdomen is obtained. No significant change in appearance of fractured stone located within the expected anatomical location of the right renal collecting system. Sensitivity of today's exam decreased due to overlying bowel gas and stool pattern. I do not see an obvious stone fragment along the course of the right ureter.      . Patient confirmed No Neulasta OnPro Device.           Urinalysis w/Scope Dipstick Dipstick Cont'd Micro  Color: Yellow Bilirubin: Neg mg/dL WBC/hpf: 10 - 79/yeq  Appearance: Cloudy Ketones: Neg mg/dL RBC/hpf: 10 - 79/yeq  Specific Gravity: 1.025 Blood: 3+ ery/uL Bacteria: Mod (26-50/hpf)  pH: 6.0 Protein: Neg mg/dL Cystals: Ca Oxalate  Glucose: Neg mg/dL Urobilinogen: 0.2 mg/dL Casts: NS (Not Seen)    Nitrites: Neg Trichomonas: Not Present    Leukocyte Esterase: 3+ leu/uL Mucous: Not Present      Epithelial Cells: 6 - 10/hpf      Yeast: Few (1 - 5/hpf)  Sperm: Not Present    ASSESSMENT:      ICD-10 Details  1 GU:   Renal calculus - N20.0    PLAN:           Orders Labs Urine Culture          Schedule Return Visit/Planned Activity: Next Available Appointment - Follow up MD, Schedule Surgery          Document Letter(s):  Created for Patient: Clinical Summary         Notes:   No interval progression of fractured stone material within the right kidney. Reviewed with Dr. Selma. Ureteroscopy, possibly staged procedure recommended. This was discussed in detail with the patient.   For ureteroscopy I described the risks which include heart attack, stroke, pulmonary embolus, death, bleeding, infection, damage to contiguous structures, positioning injury, ureteral stricture, ureteral avulsion, ureteral injury, need for ureteral stent, inability to perform ureteroscopy, need for an interval  procedure, inability to clear stone burden, stent discomfort and pain.   All questions answered to the best my ability with understanding expressed by the patient. She elects to proceed. In the event she does develop poorly controlled symptoms and/or starts passing stone material she will contact the office to be brought back in for additional evaluation.   Urology Preoperative H&P   Chief Complaint: Right renal stones  History of Present Illness: CLOTIEL TROOP is a 48 y.o. female with right renal stones here for R URS/LL R RPG, R stent placement. Denies fevers, chills, dysuria.    Past Medical History:  Diagnosis Date   Allergic rhinitis 02/27/2008   Qualifier: Diagnosis of   By: Krystal MD, Reyes LABOR     IMO SNOMED Dx Update Oct 2024     Anemia    iron  def   Antepartum fetal death    Anxiety    Arthritis    Genital herpes    GERD (gastroesophageal reflux disease)    History of bariatric surgery    History of kidney stones    History of left hip replacement    History of urinary stone    Migraines    Morbid obesity (HCC) 10/09/2016   Pituitary tumor    Polycystic ovary disease    Prolactinoma (HCC)    SVT (supraventricular tachycardia)    Vitamin D deficiency     Past Surgical History:  Procedure Laterality Date   ANTERIOR HIP REVISION Right 02/05/2024   Procedure: REVISION, TOTAL ARTHROPLASTY, HIP, ANTERIOR APPROACH;  Surgeon: Beverley Evalene BIRCH, MD;  Location: [facility];  Service: Orthopedics;  Laterality: Right;   CESAREAN SECTION N/A 10/19/2023   Procedure: CESAREAN SECTION;  Surgeon: Herchel Gloris LABOR, MD;  Location: [facility];  Service: Obstetrics;  Laterality: N/A;   CYSTOSCOPY/URETEROSCOPY/HOLMIUM LASER/STENT PLACEMENT Right 07/06/2023   Procedure: CYSTOSCOPY/RIGHT RETROGRADE URETEROSCOPY/HOLMIUM LASER/STENT PLACEMENT;  Surgeon: Selma Donnice SAUNDERS, MD;  Location: [facility];  Service: Urology;  Laterality: Right;   EXTRACORPOREAL SHOCK WAVE LITHOTRIPSY Right 04/07/2024    Procedure: LITHOTRIPSY, ESWL;  Surgeon: Selma Donnice SAUNDERS, MD;  Location: [facility];  Service: Urology;  Laterality: Right;   JOINT REPLACEMENT Left 01/13/2021   hip   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)     TOTAL HIP ARTHROPLASTY Right    tube removed     right fallopian tube removed    Allergies:  Allergies  Allergen Reactions   Butorphanol Hives and Itching   Other Other (See Comments)    Seasonal allergies    Family  History  Problem Relation Age of Onset   Hypertension Father    Heart failure Father    Hypertension Mother    Heart failure Brother     Social History:  reports that she has never smoked. She has never used smokeless tobacco. She reports that she does not drink alcohol  and does not use drugs.  ROS: A complete review of systems was performed.  All systems are negative except for pertinent findings as noted.  Physical Exam:  Vital signs in last 24 hours:   Constitutional:  Alert and oriented, No acute distress Cardiovascular: Regular rate and rhythm Respiratory: Normal respiratory effort, Lungs clear bilaterally GI: Abdomen is soft, nontender, nondistended, no abdominal masses GU: No CVA tenderness Lymphatic: No lymphadenopathy Neurologic: Grossly intact, no focal deficits Psychiatric: Normal mood and affect  Laboratory Data:  No results for input(s): WBC, HGB, HCT, PLT in the last 72 hours.  No results for input(s): NA, K, CL, GLUCOSE, BUN, CALCIUM , CREATININE in the last 72 hours.  Invalid input(s): CO3   No results found for this or any previous visit (from the past 24 hours). No results found for this or any previous visit (from the past 240 hours).  Renal Function: No results for input(s): CREATININE in the last 168 hours. CrCl cannot be calculated (Patient's most recent lab result is older than the maximum 21 days allowed.).  Radiologic Imaging: No results found.  I independently  reviewed the above imaging studies.  Assessment and Plan LYNN RECENDIZ is a 48 y.o. female with right renal stones here for R URS/LL R RPG, R stent placement.  -The risks, benefits and alternatives of cystoscopy with R URS/LL R RPG, R stent placement was discussed with the patient.  Risks include, but are not limited to: bleeding, urinary tract infection, ureteral injury, ureteral stricture disease, chronic pain, urinary symptoms, bladder injury, stent migration, the need for nephrostomy tube placement, MI, CVA, DVT, PE and the inherent risks with general anesthesia.  The patient voices understanding and wishes to proceed.       Matt R. Alashia Brownfield MD 05/19/2024, 11:28 AM  [facility] Pager: [phone]): [phone]

## 2024-05-19 NOTE — Discharge Instructions (Addendum)
 [facility] [phone] Post Ureteroscopy With or Without Stent Instructions  Definitions:  Ureter: The duct that transports urine from the kidney to the bladder. Stent:   A plastic hollow tube that is placed into the ureter, from the kidney to the bladder to prevent the ureter from swelling shut.  GENERAL INSTRUCTIONS:  Despite the fact that no skin incisions were used, the area around the ureter and bladder is raw and irritated. The stent is a foreign body which will further irritate the bladder wall. This irritation is manifested by increased frequency of urination, both day and night, and by an increase in the urge to urinate. In some, the urge to urinate is present almost always. Sometimes the urge is strong enough that you may not be able to stop yourself from urinating. The only real cure is to remove the stent and then give time for the bladder wall to heal which can't be done until the danger of the ureter swelling shut has passed, which varies.  You may see some blood in your urine while the stent is in place and a few days afterwards. Do not be alarmed, even if the urine was clear for a while. Get off your feet and drink lots of fluids until clearing occurs. If you start to pass clots or don't improve, call us .  DIET: You may return to your normal diet immediately. Because of the raw surface of your bladder, alcohol , spicy foods, acid type foods and drinks with caffeine  may cause irritation or frequency and should be used in moderation. To keep your urine flowing freely and to avoid constipation, drink plenty of fluids during the day ( 8-10 glasses ). Tip: Avoid cranberry juice because it is very acidic.  ACTIVITY: Your physical activity doesn't need to be restricted. However, if you are very active, you may see some blood in your urine. We suggest that you reduce your activity under these circumstances until the bleeding has stopped.  BOWELS: It is important to  keep your bowels regular during the postoperative period. Straining with bowel movements can cause bleeding. A bowel movement every other day is reasonable. Use a mild laxative if needed, such as Milk of Magnesia 2-3 tablespoons, or 2 Dulcolax tablets. Call if you continue to have problems. If you have been taking narcotics for pain, before, during or after your surgery, you may be constipated. Take a laxative if necessary.   MEDICATION: You should resume your pre-surgery medications unless told not to. In addition you will often be given an antibiotic to prevent infection. These should be taken as prescribed until the bottles are finished unless you are having an unusual reaction to one of the drugs.  PROBLEMS YOU SHOULD REPORT TO US : Fevers over 100.5 Fahrenheit. Heavy bleeding, or clots ( See above notes about blood in urine ). Inability to urinate. Drug reactions ( hives, rash, nausea, vomiting, diarrhea ). Severe burning or pain with urination that is not improving.  FOLLOW-UP: You will need a follow-up appointment to monitor your progress. Call for this appointment at the number listed above. Usually the first appointment will be about three to fourteen days after your surgery.  You have a stent draining your right kidney.  You may remove the stent by pulling Ontak string on Thursday morning.

## 2024-05-19 NOTE — Transfer of Care (Signed)
 Immediate Anesthesia Transfer of Care Note  Patient: Sarah Saunders  Procedure(s) Performed: CYSTOSCOPY/URETEROSCOPY/HOLMIUM LASER/STENT PLACEMENT (Right) CYSTOSCOPY, WITH RETROGRADE PYELOGRAM (Right)  Patient Location: PACU  Anesthesia Type:General  Level of Consciousness: awake, alert , oriented, and patient cooperative  Airway & Oxygen Therapy: Patient Spontanous Breathing and Patient connected to face mask oxygen  Post-op Assessment: Report given to RN and Post -op Vital signs reviewed and stable  Post vital signs: Reviewed and stable  Last Vitals:  Vitals Value Taken Time  BP 155/94   Temp    Pulse 56 05/19/24 16:16  Resp 17 05/19/24 16:16  SpO2 100 % 05/19/24 16:16  Vitals shown include unfiled device data.  Last Pain:  Vitals:   05/19/24 1307  TempSrc:   PainSc: 4          Complications: No notable events documented.

## 2024-05-20 ENCOUNTER — Encounter (HOSPITAL_COMMUNITY): Payer: Self-pay | Admitting: Urology

## 2024-05-22 ENCOUNTER — Other Ambulatory Visit: Payer: Self-pay

## 2024-05-22 ENCOUNTER — Ambulatory Visit (INDEPENDENT_AMBULATORY_CARE_PROVIDER_SITE_OTHER): Admitting: Internal Medicine

## 2024-05-22 ENCOUNTER — Encounter: Payer: Self-pay | Admitting: Internal Medicine

## 2024-05-22 VITALS — BP 115/79 | HR 71 | Temp 97.5°F | Wt 259.0 lb

## 2024-05-22 DIAGNOSIS — Z96641 Presence of right artificial hip joint: Secondary | ICD-10-CM

## 2024-05-22 MED ORDER — AMOXICILLIN 500 MG PO CAPS: 500.0000 mg | ORAL_CAPSULE | Freq: Two times a day (BID) | ORAL | 5 refills | Status: AC

## 2024-05-22 NOTE — Progress Notes (Signed)
 Patient: Sarah Saunders  DOB: 1975/12/28 MRN: [id number] PCP: Verdia Lombard, MD    Chief Complaint  Patient presents with   Follow-up     Patient Active Problem List   Diagnosis Date Noted   History of revision of total replacement of right hip joint 02/05/2024   ABLA (acute blood loss anemia) 10/20/2023   Pregnancy 10/19/2023   S/P C-section 10/19/2023   AMA (advanced maternal age) multigravida 35+, third trimester 10/19/2023   Decreased fetal movement affecting management of pregnancy in third trimester, fetus 1 10/19/2023   Placental abruption in third trimester 10/19/2023   Short cervix affecting pregnancy 09/25/2023   Kidney stone complicating pregnancy 07/05/2023   IUFD at 20 weeks or more of gestation 06/19/2022   PSVT (paroxysmal supraventricular tachycardia) 05/26/2019   Iron  deficiency anemia 12/13/2016   Morbid obesity (HCC) 10/09/2016   Tachycardia 01/08/2012   Benign neoplasm of skin 03/06/2008   POLYCYSTIC OVARIAN DISEASE 02/27/2008   Overweight 02/27/2008     Subjective:  48 year old female with past medical history of bariatric surgery, SVT followed by cardiology, PCOD, C-section in 2025 presents for 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  Patient taken to the OR on 6/18 for ORIF finding showing right hip purulent fluid.  Patient-amoxicillin  prior to OR.  Noted that this Synovasure in clinic was positive for E faecalis.  She was discharged on daptomycin  and ceftriaxone  x 6 weeks EOT 7/29 then plan for amoxicillin  for p.o. suppression to cover E faecalis.Reprots  she thinks she has a yeast infection with itching on abx.  03/20/24 PICC line out. No complaints, doing well. On amox.  05/22/24: tolerating abx, no yeast infection at this point. Hip feeling better. No fevers of chills.  Review of Systems  All other systems reviewed and are negative.   Past Medical History:  Diagnosis Date   Allergic rhinitis 02/27/2008    Qualifier: Diagnosis of   By: Krystal MD, Reyes LABOR     IMO SNOMED Dx Update Oct 2024     Anemia    iron  def   Antepartum fetal death    Anxiety    Arthritis    Genital herpes    GERD (gastroesophageal reflux disease)    History of bariatric surgery    History of kidney stones    History of left hip replacement    History of urinary stone    Migraines    Morbid obesity (HCC) 10/09/2016   Pituitary tumor    Polycystic ovary disease    Prolactinoma (HCC)    SVT (supraventricular tachycardia)    Vitamin D deficiency     Outpatient Medications Prior to Visit  Medication Sig Dispense Refill   acetaminophen  (TYLENOL ) 325 MG tablet Take 2 tablets (650 mg total) by mouth every 6 (six) hours. 30 tablet 0   albuterol  (PROVENTIL  HFA;VENTOLIN  HFA) 108 (90 BASE) MCG/ACT inhaler Inhale 2 puffs into the lungs every 4 (four) hours as needed for wheezing or shortness of breath.     amoxicillin  (AMOXIL ) 500 MG capsule Take 1 capsule (500 mg total) by mouth 2 (two) times daily. 60 capsule 5   Cetirizine HCl (ZYRTEC ALLERGY) 10 MG CAPS Take 10 mg by mouth in the morning.     Cholecalciferol (VITAMIN D3) 50 MCG (2000 UT) capsule Take 2,000 Units by mouth daily.     diclofenac (VOLTAREN) 75 MG EC tablet Take 75 mg by mouth 2 (two) times daily.     HYDROmorphone  (  DILAUDID ) 2 MG tablet Take 1 tablet (2 mg total) by mouth every 6 (six) hours as needed for severe pain (pain score 7-10). after hip surgery 30 tablet 0   ipratropium (ATROVENT ) 0.03 % nasal spray Place 1 spray into both nostrils in the morning and at bedtime.     methocarbamol  (ROBAXIN ) 750 MG tablet Take 1 tablet (750 mg total) by mouth every 6 (six) hours as needed for muscle spasms. 60 tablet 0   metoprolol  tartrate (LOPRESSOR ) 25 MG tablet TAKE 0.5 TABLETS (12.5 MG TOTAL) BY MOUTH 2 (TWO) TIMES DAILY AS NEEDED (SVT FLARE UPS). 90 tablet 2   ondansetron  (ZOFRAN -ODT) 4 MG disintegrating tablet Take 4 mg by mouth every 8 (eight) hours as needed.      oxyCODONE -acetaminophen  (PERCOCET) 5-325 MG tablet Take 1 tablet by mouth every 4 (four) hours as needed for up to 18 doses for severe pain (pain score 7-10). 18 tablet 0   RABEprazole (ACIPHEX) 20 MG tablet Take 20 mg by mouth every morning.     sertraline  (ZOLOFT ) 100 MG tablet Take 200 mg by mouth in the morning.     tamsulosin  (FLOMAX ) 0.4 MG CAPS capsule Take 1 capsule (0.4 mg total) by mouth daily for 60 doses. 30 capsule 1   traZODone  (DESYREL ) 50 MG tablet Take 100 mg by mouth at bedtime as needed.     aspirin  81 MG chewable tablet Chew 81 mg by mouth 2 (two) times daily. Swallow whole. (Patient not taking: Reported on 05/22/2024)     diphenhydrAMINE  (BENADRYL  ALLERGY) 25 mg capsule Take 1 capsule (25 mg total) by mouth every 6 (six) hours as needed. (Patient not taking: Reported on 05/22/2024) 30 capsule 0   docusate sodium  (COLACE) 100 MG capsule Take 1 capsule (100 mg total) by mouth daily. (Patient not taking: Reported on 05/22/2024) 10 capsule 0   fluconazole  (DIFLUCAN ) 150 MG tablet Take 1 tablet (150 mg total) by mouth daily. Take  72 hours apart (Patient not taking: Reported on 05/22/2024) 2 tablet 3   liothyronine  (CYTOMEL ) 5 MCG tablet Take 5 mcg by mouth daily. (Patient not taking: Reported on 05/16/2024)     oxyCODONE -acetaminophen  (PERCOCET) 5-325 MG tablet Take 1 tablet by mouth every 4 (four) hours as needed for up to 18 doses for severe pain (pain score 7-10). (Patient not taking: Reported on 05/22/2024) 18 tablet 0   pantoprazole  (PROTONIX ) 40 MG tablet Take 40 mg by mouth daily. (Patient not taking: Reported on 05/22/2024)     Prenatal Vit-Fe Fumarate-FA (PRENATAL VITAMIN PO) Take 2 tablets by mouth daily. (Patient not taking: Reported on 05/22/2024)     semaglutide-weight management (WEGOVY) 0.5 MG/0.5ML SOAJ SQ injection Inject 0.5 mg into the skin once a week. (Patient not taking: Reported on 05/22/2024)     No facility-administered medications prior to visit.     Allergies   Allergen Reactions   Butorphanol Hives and Itching   Other Other (See Comments)    Seasonal allergies    Social History   Tobacco Use   Smoking status: Never   Smokeless tobacco: Never  Vaping Use   Vaping status: Never Used  Substance Use Topics   Alcohol  use: No   Drug use: No    Family History  Problem Relation Age of Onset   Hypertension Father    Heart failure Father    Hypertension Mother    Heart failure Brother     Objective:   Vitals:   05/22/24 0853  BP: 115/79  Pulse: 71  Temp: (!) 97.5 F (36.4 C)  TempSrc: Oral  SpO2: 98%  Weight: 259 lb (117.5 kg)   Body mass index is 39.97 kg/m.  Physical Exam Constitutional:      Appearance: Normal appearance.  HENT:     Head: Normocephalic and atraumatic.     Right Ear: Tympanic membrane normal.     Left Ear: Tympanic membrane normal.     Nose: Nose normal.     Mouth/Throat:     Mouth: Mucous membranes are moist.  Eyes:     Extraocular Movements: Extraocular movements intact.     Conjunctiva/sclera: Conjunctivae normal.     Pupils: Pupils are equal, round, and reactive to light.  Cardiovascular:     Rate and Rhythm: Normal rate and regular rhythm.     Heart sounds: No murmur heard.    No friction rub. No gallop.  Pulmonary:     Effort: Pulmonary effort is normal.     Breath sounds: Normal breath sounds.  Abdominal:     General: Abdomen is flat.     Palpations: Abdomen is soft.  Musculoskeletal:        General: Normal range of motion.  Skin:    General: Skin is warm and dry.  Neurological:     General: No focal deficit present.     Mental Status: She is alert and oriented to person, place, and time.  Psychiatric:        Mood and Affect: Mood normal.     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 Patient on amoxicillin  prior to OR.  -  At clinic her Ryland was positive E faecalis.  Blood cultures no growth. - Discharged on daptomycin  and ceftriaxone 's x 6 weeks EOT 7/29 then transitioned to Amoxil  500 mg p.o. twice daily(started on 7/30) for suppresion x 6 monthstill 09/19/24 -Pt follows with ortho  Plan Continue amox suppresion -labs today -f/u 6 months  #alp elevation 154 on 3/25/250-> 145 02/13/24. This is prior to abx starting. Unrelated -Follow up with GI    Loney Stank, MD [facility]   05/22/24  9:11 AM I have personally spent 35 minutes involved in face-to-face and non-face-to-face activities for this patient on the day of the visit. Professional time spent includes the following activities: Preparing to see the patient (review of tests), Obtaining and/or reviewing separately obtained history (admission/discharge record), Performing a medically appropriate examination and/or evaluation , Ordering medications/tests/procedures, referring and communicating with other health care professionals, Documenting clinical information in the EMR, Independently interpreting results (not separately reported), Communicating results to the patient/family/caregiver, Counseling and educating the patient/family/caregiver and Care coordination (not separately reported).

## 2024-05-23 LAB — SEDIMENTATION RATE: Sed Rate: 28 mm/h — ABNORMAL HIGH (ref 0–20)

## 2024-05-23 LAB — CBC WITH DIFFERENTIAL/PLATELET
Absolute Lymphocytes: 2464 {cells}/uL (ref 850–3900)
Absolute Monocytes: 358 {cells}/uL (ref 200–950)
Basophils Absolute: 28 {cells}/uL (ref 0–200)
Basophils Relative: 0.5 %
Eosinophils Absolute: 50 {cells}/uL (ref 15–500)
Eosinophils Relative: 0.9 %
HCT: 36.1 % (ref 35.0–45.0)
Hemoglobin: 10.8 g/dL — ABNORMAL LOW (ref 11.7–15.5)
MCH: 26.2 pg — ABNORMAL LOW (ref 27.0–33.0)
MCHC: 29.9 g/dL — ABNORMAL LOW (ref 32.0–36.0)
MCV: 87.4 fL (ref 80.0–100.0)
MPV: 12.3 fL (ref 7.5–12.5)
Monocytes Relative: 6.4 %
Neutro Abs: 2699 {cells}/uL (ref 1500–7800)
Neutrophils Relative %: 48.2 %
Platelets: 188 Thousand/uL (ref 140–400)
RBC: 4.13 Million/uL (ref 3.80–5.10)
RDW: 12.8 % (ref 11.0–15.0)
Total Lymphocyte: 44 %
WBC: 5.6 Thousand/uL (ref 3.8–10.8)

## 2024-05-23 LAB — COMPLETE METABOLIC PANEL WITHOUT GFR
AG Ratio: 1.5 (calc) (ref 1.0–2.5)
ALT: 41 U/L — ABNORMAL HIGH (ref 6–29)
AST: 23 U/L (ref 10–35)
Albumin: 3.8 g/dL (ref 3.6–5.1)
Alkaline phosphatase (APISO): 133 U/L — ABNORMAL HIGH (ref 31–125)
BUN: 19 mg/dL (ref 7–25)
CO2: 28 mmol/L (ref 20–32)
Calcium: 9.1 mg/dL (ref 8.6–10.2)
Chloride: 109 mmol/L (ref 98–110)
Creat: 0.9 mg/dL (ref 0.50–0.99)
Globulin: 2.5 g/dL (ref 1.9–3.7)
Glucose, Bld: 63 mg/dL — ABNORMAL LOW (ref 65–99)
Potassium: 4.6 mmol/L (ref 3.5–5.3)
Sodium: 142 mmol/L (ref 135–146)
Total Bilirubin: 0.2 mg/dL (ref 0.2–1.2)
Total Protein: 6.3 g/dL (ref 6.1–8.1)

## 2024-05-23 LAB — C-REACTIVE PROTEIN: CRP: 3.9 mg/L (ref <8.0)

## 2024-05-27 ENCOUNTER — Encounter (HOSPITAL_COMMUNITY): Payer: Self-pay

## 2024-05-27 ENCOUNTER — Telehealth (HOSPITAL_COMMUNITY): Payer: Self-pay

## 2024-05-27 NOTE — Telephone Encounter (Signed)
 Attempted to reach patient to discuss upcoming procedure, no answer or VM available after continuous rings. Patient has follow up with APP on 10/14.

## 2024-06-02 NOTE — Progress Notes (Unsigned)
  Electrophysiology Office Note:   Date:  06/03/2024  ID:  LEANAH KOLANDER, DOB 1976/06/30, MRN [id number]  Primary Cardiologist: Newman JINNY Lawrence, MD Electrophysiologist: Soyla Gladis Norton, MD   Electrophysiologist:  Soyla Gladis Norton, MD      History of Present Illness:   Sarah Saunders is a 48 y.o. female with h/o SVT and s/p bariatric surgery seen today for routine electrophysiology followup.   She is scheduled for SVT ablation 06/17/2024  Since last being seen in our clinic the patient reports doing well. She has continued to have occasional breakthrough and is looking forward to ablation. Longest episode ~47 minutes. Otherwise, she denies chest pain, dyspnea, PND, orthopnea, nausea, vomiting, dizziness, syncope, edema, weight gain, or early satiety.   Review of systems complete and found to be negative unless listed in HPI.   EP Information / Studies Reviewed:    EKG is ordered today. Personal review as below.  EKG Interpretation Date/Time:  Tuesday June 03 2024 08:15:45 EDT Ventricular Rate:  83 PR Interval:  154 QRS Duration:  88 QT Interval:  368 QTC Calculation: 432 R Axis:   75  Text Interpretation: Normal sinus rhythm Normal ECG When compared with ECG of 05-Mar-2024 13:34, PREVIOUS ECG IS PRESENT Confirmed by Lesia Sharper [phone]) on 06/03/2024 8:18:47 AM    Arrhythmia/Device History No specialty comments available.   Physical Exam:   VS:  BP 98/64   Pulse 83   Ht 5' 7 (1.702 m)   Wt 254 lb (115.2 kg)   SpO2 99%   BMI 39.78 kg/m    Wt Readings from Last 3 Encounters:  06/03/24 254 lb (115.2 kg)  05/22/24 259 lb (117.5 kg)  05/19/24 253 lb 8.5 oz (115 kg)     GEN: No acute distress NECK: No JVD; No carotid bruits CARDIAC: Regular rate and rhythm, no murmurs, rubs, gallops RESPIRATORY:  Clear to auscultation without rales, wheezing or rhonchi  ABDOMEN: Soft, non-tender, non-distended EXTREMITIES:  No edema; No deformity   ASSESSMENT  AND PLAN:    PSVT Had 4 episodes during prior pregnancy Previously had metoprolol  prn but hadn't needed it Had increasing burden and preferred to avoid chronic medications. We reviewed   Risk, benefits, and alternatives to EP study and radiofrequency ablation for SVT were also discussed in detail today. These risks include but are not limited to stroke, bleeding, vascular damage, tamponade, perforation, damage to the esophagus, lungs, and other structures, pulmonary vein stenosis, worsening renal function, and death.   Follow up with EP Team as usual post procedure  Signed, Sharper Prentice Lesia, PA-C

## 2024-06-02 NOTE — H&P (View-Only) (Signed)
  Electrophysiology Office Note:   Date:  06/03/2024  ID:  Sarah Saunders, DOB 1976/06/30, MRN [id number]  Primary Cardiologist: Newman JINNY Lawrence, MD Electrophysiologist: Soyla Gladis Norton, MD   Electrophysiologist:  Soyla Gladis Norton, MD      History of Present Illness:   Sarah Saunders is a 48 y.o. female with h/o SVT and s/p bariatric surgery seen today for routine electrophysiology followup.   She is scheduled for SVT ablation 06/17/2024  Since last being seen in our clinic the patient reports doing well. She has continued to have occasional breakthrough and is looking forward to ablation. Longest episode ~47 minutes. Otherwise, she denies chest pain, dyspnea, PND, orthopnea, nausea, vomiting, dizziness, syncope, edema, weight gain, or early satiety.   Review of systems complete and found to be negative unless listed in HPI.   EP Information / Studies Reviewed:    EKG is ordered today. Personal review as below.  EKG Interpretation Date/Time:  Tuesday June 03 2024 08:15:45 EDT Ventricular Rate:  83 PR Interval:  154 QRS Duration:  88 QT Interval:  368 QTC Calculation: 432 R Axis:   75  Text Interpretation: Normal sinus rhythm Normal ECG When compared with ECG of 05-Mar-2024 13:34, PREVIOUS ECG IS PRESENT Confirmed by Lesia Sharper [phone]) on 06/03/2024 8:18:47 AM    Arrhythmia/Device History No specialty comments available.   Physical Exam:   VS:  BP 98/64   Pulse 83   Ht 5' 7 (1.702 m)   Wt 254 lb (115.2 kg)   SpO2 99%   BMI 39.78 kg/m    Wt Readings from Last 3 Encounters:  06/03/24 254 lb (115.2 kg)  05/22/24 259 lb (117.5 kg)  05/19/24 253 lb 8.5 oz (115 kg)     GEN: No acute distress NECK: No JVD; No carotid bruits CARDIAC: Regular rate and rhythm, no murmurs, rubs, gallops RESPIRATORY:  Clear to auscultation without rales, wheezing or rhonchi  ABDOMEN: Soft, non-tender, non-distended EXTREMITIES:  No edema; No deformity   ASSESSMENT  AND PLAN:    PSVT Had 4 episodes during prior pregnancy Previously had metoprolol  prn but hadn't needed it Had increasing burden and preferred to avoid chronic medications. We reviewed   Risk, benefits, and alternatives to EP study and radiofrequency ablation for SVT were also discussed in detail today. These risks include but are not limited to stroke, bleeding, vascular damage, tamponade, perforation, damage to the esophagus, lungs, and other structures, pulmonary vein stenosis, worsening renal function, and death.   Follow up with EP Team as usual post procedure  Signed, Sharper Prentice Lesia, PA-C

## 2024-06-03 ENCOUNTER — Encounter: Payer: Self-pay | Admitting: Hematology

## 2024-06-03 ENCOUNTER — Encounter: Payer: Self-pay | Admitting: Student

## 2024-06-03 ENCOUNTER — Ambulatory Visit: Attending: Student | Admitting: Student

## 2024-06-03 VITALS — BP 98/64 | HR 83 | Ht 67.0 in | Wt 254.0 lb

## 2024-06-03 DIAGNOSIS — I471 Supraventricular tachycardia, unspecified: Secondary | ICD-10-CM

## 2024-06-03 DIAGNOSIS — Z01812 Encounter for preprocedural laboratory examination: Secondary | ICD-10-CM | POA: Diagnosis not present

## 2024-06-03 LAB — CBC

## 2024-06-03 NOTE — Patient Instructions (Signed)
 Medication Instructions:  Your physician recommends that you continue on your current medications as directed. Please refer to the Current Medication list given to you today.  *If you need a refill on your cardiac medications before your next appointment, please call your pharmacy*  Lab Work: BMET, CBC-TODAY If you have labs (blood work) drawn today and your tests are completely normal, you will receive your results only by: MyChart Message (if you have MyChart) OR A paper copy in the mail If you have any lab test that is abnormal or we need to change your treatment, we will call you to review the results.  Testing/Procedures: See letter  Follow-Up: At [facility], you and your health needs are our priority.  As part of our continuing mission to provide you with exceptional heart care, our providers are all part of one team.  This team includes your primary Cardiologist (physician) and Advanced Practice Providers or APPs (Physician Assistants and Nurse Practitioners) who all work together to provide you with the care you need, when you need it.  Your next appointment:   Follow up will be arranged for you and print out on your discharge summary after your procedure.

## 2024-06-04 ENCOUNTER — Ambulatory Visit: Payer: Self-pay | Admitting: Student

## 2024-06-04 LAB — BASIC METABOLIC PANEL WITH GFR
BUN/Creatinine Ratio: 18 (ref 9–23)
BUN: 14 mg/dL (ref 6–24)
CO2: 21 mmol/L (ref 20–29)
Calcium: 9.5 mg/dL (ref 8.7–10.2)
Chloride: 108 mmol/L — ABNORMAL HIGH (ref 96–106)
Creatinine, Ser: 0.77 mg/dL (ref 0.57–1.00)
Glucose: 74 mg/dL (ref 70–99)
Potassium: 4.5 mmol/L (ref 3.5–5.2)
Sodium: 140 mmol/L (ref 134–144)
eGFR: 96 mL/min/1.73 (ref >59)

## 2024-06-04 LAB — CBC
Hematocrit: 37.5 % (ref 34.0–46.6)
Hemoglobin: 11.6 g/dL (ref 11.1–15.9)
MCH: 26.8 pg (ref 26.6–33.0)
MCHC: 30.9 g/dL — AB (ref 31.5–35.7)
MCV: 87 fL (ref 79–97)
Platelets: 242 x10E3/uL (ref 150–450)
RBC: 4.33 x10E6/uL (ref 3.77–5.28)
RDW: 12.9 % (ref 11.7–15.4)
WBC: 4.8 x10E3/uL (ref 3.4–10.8)

## 2024-06-06 ENCOUNTER — Other Ambulatory Visit: Payer: Self-pay

## 2024-06-06 DIAGNOSIS — D509 Iron deficiency anemia, unspecified: Secondary | ICD-10-CM

## 2024-06-09 ENCOUNTER — Inpatient Hospital Stay

## 2024-06-09 ENCOUNTER — Inpatient Hospital Stay: Admitting: Hematology

## 2024-06-09 ENCOUNTER — Inpatient Hospital Stay: Attending: Hematology

## 2024-06-09 VITALS — BP 137/87 | HR 87 | Temp 97.0°F | Resp 20 | Wt 258.9 lb

## 2024-06-09 DIAGNOSIS — E538 Deficiency of other specified B group vitamins: Secondary | ICD-10-CM | POA: Diagnosis not present

## 2024-06-09 DIAGNOSIS — D509 Iron deficiency anemia, unspecified: Secondary | ICD-10-CM | POA: Insufficient documentation

## 2024-06-09 DIAGNOSIS — N96 Recurrent pregnancy loss: Secondary | ICD-10-CM | POA: Diagnosis not present

## 2024-06-09 LAB — CMP (CANCER CENTER ONLY)
ALT: 23 U/L (ref 0–44)
AST: 24 U/L (ref 15–41)
Albumin: 3.6 g/dL (ref 3.5–5.0)
Alkaline Phosphatase: 138 U/L — ABNORMAL HIGH (ref 38–126)
Anion gap: 5 (ref 5–15)
BUN: 13 mg/dL (ref 6–20)
CO2: 25 mmol/L (ref 22–32)
Calcium: 9.3 mg/dL (ref 8.9–10.3)
Chloride: 111 mmol/L (ref 98–111)
Creatinine: 0.7 mg/dL (ref 0.44–1.00)
GFR, Estimated: >60 mL/min (ref >60)
Glucose, Bld: 78 mg/dL (ref 70–99)
Potassium: 3.9 mmol/L (ref 3.5–5.1)
Sodium: 141 mmol/L (ref 135–145)
Total Bilirubin: 0.3 mg/dL (ref 0.0–1.2)
Total Protein: 6.7 g/dL (ref 6.5–8.1)

## 2024-06-09 LAB — CBC WITH DIFFERENTIAL (CANCER CENTER ONLY)
Abs Immature Granulocytes: 0.01 K/uL (ref 0.00–0.07)
Basophils Absolute: 0 K/uL (ref 0.0–0.1)
Basophils Relative: 1 %
Eosinophils Absolute: 0.1 K/uL (ref 0.0–0.5)
Eosinophils Relative: 2 %
HCT: 34.2 % — ABNORMAL LOW (ref 36.0–46.0)
Hemoglobin: 11 g/dL — ABNORMAL LOW (ref 12.0–15.0)
Immature Granulocytes: 0 %
Lymphocytes Relative: 42 %
Lymphs Abs: 1.9 K/uL (ref 0.7–4.0)
MCH: 26.7 pg (ref 26.0–34.0)
MCHC: 32.2 g/dL (ref 30.0–36.0)
MCV: 83 fL (ref 80.0–100.0)
Monocytes Absolute: 0.2 K/uL (ref 0.1–1.0)
Monocytes Relative: 5 %
Neutro Abs: 2.3 K/uL (ref 1.7–7.7)
Neutrophils Relative %: 50 %
Platelet Count: 212 K/uL (ref 150–400)
RBC: 4.12 MIL/uL (ref 3.87–5.11)
RDW: 14.5 % (ref 11.5–15.5)
WBC Count: 4.4 K/uL (ref 4.0–10.5)
nRBC: 0 % (ref 0.0–0.2)

## 2024-06-09 LAB — IRON AND IRON BINDING CAPACITY (CC-WL,HP ONLY)
Iron: 66 ug/dL (ref 28–170)
Saturation Ratios: 22 % (ref 10.4–31.8)
TIBC: 297 ug/dL (ref 250–450)
UIBC: 231 ug/dL (ref 148–442)

## 2024-06-09 LAB — ANTITHROMBIN III: AntiThromb III Func: 102 % (ref 75–120)

## 2024-06-09 LAB — FERRITIN: Ferritin: 74 ng/mL (ref 11–307)

## 2024-06-09 LAB — VITAMIN B12: Vitamin B-12: 732 pg/mL (ref 180–914)

## 2024-06-09 NOTE — Progress Notes (Signed)
 HEMATOLOGY/ONCOLOGY CLINIC NOTE  Date of Service: 06/09/2024  Patient Care Team: Verdia Lombard, MD as PCP - General (Internal Medicine) Elmira Newman PARAS, MD as PCP - Cardiology (Cardiology) Inocencio Soyla Lunger, MD as PCP - Electrophysiology (Cardiology)  CHIEF COMPLAINTS/PURPOSE OF CONSULTATION:  Anemia  HISTORY OF PRESENTING ILLNESS:  (12/13/2016) Sarah Saunders is a wonderful 48 y.o. female who has been referred to us  by Dr .VERDIA LOMBARD, MD  for evaluation and management of iron  deficiency anemia not responsive to oral iron  therapy.    Patient has a history of prolactinoma, polycystic ovarian disease, morbid obesity, previous history of iron  deficiency, GERD on chronic PPI therapy who is undergoing evaluation for bariatric surgery with Dr. Wilfred Guan at [facility].   As part of her pre-bariatric surgery evaluation (plan for duodenal switch surgery) she was noted to have zinc deficiency, iron  deficiency. She notes that she has been on ferrous sulfate  1 tablet by mouth twice a day since December 2017 with no improvement in her hemoglobin or ferritin levels.     She notes that she has been on AcipHex for years. She notes no overt GI bleeding. Notes that she has had no menstrual losses for about 5 months and that her periods have been fairly irregular due to her PCOS.   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 Recent labs with her primary care physician  from 10/30/2016 showed a hemoglobin of 9.5 with an MCV of 77 elevated RDW of 18.5 with normal W BC count of 11.2k and platelets of 312k. Iron  studies were not available in referable information that she had an iron  saturation of about 8% earlier this year.   No other acute new symptoms at this time. She notes that her bariatric medicine team has told her that they cannot proceed for surgical planning until her anemia and iron  deficiency are  completely corrected.   She notes that she is on zinc and copper replacement as per her bariatric medicine team.   Has not had any overt evidence of B12 deficiency.   Currently he is taking ferrous sulfate  1 tablet by mouth twice a day with vitamin C without improvement in her anemia. Patient notes that she has never required previous blood transfusions or IV iron .   We discussed about the role of IV iron  replacement in details and the possible etiologies for her iron  deficiency anemia including poor absorption due to chronic PPI therapy. She has been on high doses of ibuprofen  for one acute pains and is also on SSRI and we discussed that this combination could significantly increase her risk of GI bleeding. She was recommended to discuss this with her primary care physician.  HISTORY OF PRESENTING ILLNESS: (09/18/2023) Sarah Saunders is a wonderful 48 y.o. female who has been referred to us  by CNM Orie Bonus for evaluation and management of Anemia. Hx of chronic anemia, bariatric surgery. She has had iron  infusions prior pregnancy.   Labs from 07/15/2023 showed patient is anemic with hgb of 9.9 g/dL with hct of 66%. No recent iron  labs.  Patient was previously seen by us  in 12/13/2016 for iron  deficiency anemia. She had IV Injectafer  750 mg weekly for 2 dose for surgeries. She was recommended to follow-up with us  8 weeks after initial visit. Patient did not follow-up.  Patient is currently 26 weeks 4d pregnant. She reports this is her 5th pregnancy. Still birth at 81 week pregnancy in  2023, unsure of the cause. G5P1. Patient notes she has been doing well overall since our last visit. She had her bariatric surgery and she follows-up with her PCP for Vitamin B-12 levels. She had lost around 170 lbs before her 2023 pregnancy.   Current medications: 500 mg Vitamin B-12 sublingually, pre-natal vitamin, and Vitamin D-3 supplement. NU Iron  once daily, started by OGYN around one month ago. She has  been tolerating NU Iron  well without any toxicities. Denies calcium  supplement.   She complains of significant fatigue and chronic hip pain. She also complains of sleep depravation. She occasionally takes Benadryl  to help her sleep.   She denies any new infection issues, fever, chills, night sweats, unexpected weight loss, back pain, chest pain, SOB, abdominal pain, or leg swelling.   Patient notes she has been eating well, gained around 13-14 lbs.   FmHx of anemia - mother. Denies any FmHx of blood disorders.   She has been anemic since she was 18. She denies any PmHx of blood disorder.   She has PmHx of kidney stones. She currently has occasional microscopic blood. She currently has 2 kidney stones in her right kidney.   She denies any IV Iron  infusions since 2018.   INTERVAL HISTORY:  Sarah Saunders is a wonderful 48 y.o. female who is here for continued evaluation and management of iron -deficiency anemia.  Last seen by me on 02/13/2024 following an emergency C-section February 28th, 2025 due to a placental abruption at [redacted] weeks gestation, which caused emotional distress due to loss of pregnancy but was managing with counseling for emotional wellness. Her placenta was sent for analysis and there were no findings of a clotting disorder.  Today, she reports that her periods have not restarted following her emergency C-section 10/19/2023. Denies chance of pregnancy today, but states that she will likely try again next year. She reiterates denial of any personal hx of blood clots or any immediate family with blood clotting history. Additionally, recalls previous miscarriage in 2023 at [redacted] weeks gestation with no known cause.   In there interim, she underwent several procedures: Total Right Hip Revision on 02/05/2024, Lithotripsy on 04/07/2024, and Cystoscopy on 05/19/2024. She reports that she has an upcoming colonoscopy and Cardiac Ablation for SVTs.  MEDICAL HISTORY:  Past Medical History:   Diagnosis Date   Allergic rhinitis 02/27/2008   Qualifier: Diagnosis of   By: Krystal MD, Reyes LABOR     IMO SNOMED Dx Update Oct 2024     Anemia    iron  def   Antepartum fetal death    Anxiety    Arthritis    Genital herpes    GERD (gastroesophageal reflux disease)    History of bariatric surgery    History of kidney stones    History of left hip replacement    History of urinary stone    Migraines    Morbid obesity (HCC) 10/09/2016   Pituitary tumor    Polycystic ovary disease    Prolactinoma (HCC)    SVT (supraventricular tachycardia)    Vitamin D deficiency    SURGICAL HISTORY: Past Surgical History:  Procedure Laterality Date   ANTERIOR HIP REVISION Right 02/05/2024   Procedure: REVISION, TOTAL ARTHROPLASTY, HIP, ANTERIOR APPROACH;  Surgeon: Beverley Evalene BIRCH, MD;  Location: [facility];  Service: Orthopedics;  Laterality: Right;   CESAREAN SECTION N/A 10/19/2023   Procedure: CESAREAN SECTION;  Surgeon: Herchel Gloris LABOR, MD;  Location: [facility];  Service: Obstetrics;  Laterality: N/A;  CYSTOSCOPY W/ RETROGRADES Right 05/19/2024   Procedure: CYSTOSCOPY, WITH RETROGRADE PYELOGRAM;  Surgeon: Selma Donnice SAUNDERS, MD;  Location: [facility];  Service: Urology;  Laterality: Right;   CYSTOSCOPY/URETEROSCOPY/HOLMIUM LASER/STENT PLACEMENT Right 07/06/2023   Procedure: CYSTOSCOPY/RIGHT RETROGRADE URETEROSCOPY/HOLMIUM LASER/STENT PLACEMENT;  Surgeon: Selma Donnice SAUNDERS, MD;  Location: [facility];  Service: Urology;  Laterality: Right;   CYSTOSCOPY/URETEROSCOPY/HOLMIUM LASER/STENT PLACEMENT Right 05/19/2024   Procedure: CYSTOSCOPY/URETEROSCOPY/HOLMIUM LASER/STENT PLACEMENT;  Surgeon: Selma Donnice SAUNDERS, MD;  Location: [facility];  Service: Urology;  Laterality: Right;   EXTRACORPOREAL SHOCK WAVE LITHOTRIPSY Right 04/07/2024   Procedure: LITHOTRIPSY, ESWL;  Surgeon: Selma Donnice SAUNDERS, MD;  Location: [facility];  Service: Urology;  Laterality: Right;   JOINT REPLACEMENT Left 01/13/2021   hip   LAPAROSCOPIC GASTRIC  RESTRICTIVE DUODENAL PROCEDURE (DUODENAL SWITCH)     TOTAL HIP ARTHROPLASTY Right    tube removed     right fallopian tube removed    SOCIAL HISTORY: Social History   Socioeconomic History   Marital status: Married    Spouse name: Ozell   Number of children: 1   Years of education: Not on file   Highest education level: Not on file  Occupational History   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   Social Drivers of Corporate Investment Banker Strain: Not on file  Food Insecurity: No Food Insecurity (02/05/2024)   Hunger Vital Sign    Worried About Running Out of Food in the Last Year: Never true    Ran Out of Food in the Last Year: Never true  Transportation Needs: No Transportation Needs (02/05/2024)   PRAPARE - Administrator, Civil Service (Medical): No    Lack of Transportation (Non-Medical): No  Physical Activity: Not on file  Stress: Not on file  Social Connections: Unknown (02/05/2024)   Social Connection and Isolation Panel    Frequency of Communication with Friends and Family: More than three times a week    Frequency of Social Gatherings with Friends and Family: Three times a week    Attends Religious Services: More than 4 times per year    Active Member of Clubs or Organizations: Patient declined    Attends Banker Meetings: Patient declined    Marital Status: Married  Catering Manager Violence: Not At Risk (02/05/2024)   Humiliation, Afraid, Rape, and Kick questionnaire    Fear of Current or Ex-Partner: No    Emotionally Abused: No    Physically Abused: No    Sexually Abused: No    FAMILY HISTORY: Family  History  Problem Relation Age of Onset   Hypertension Father    Heart failure Father    Hypertension Mother    Heart failure Brother     ALLERGIES:  is allergic to butorphanol and other.  MEDICATIONS:  Current Outpatient Medications  Medication Sig Dispense Refill   acetaminophen  (TYLENOL ) 325 MG tablet Take 2 tablets (650 mg total) by mouth every 6 (six) hours. 30 tablet 0   albuterol  (PROVENTIL  HFA;VENTOLIN  HFA) 108 (90 BASE) MCG/ACT inhaler Inhale 2 puffs into the lungs every 4 (four)  hours as needed for wheezing or shortness of breath.     amoxicillin  (AMOXIL ) 500 MG capsule Take 1 capsule (500 mg total) by mouth 2 (two) times daily. 60 capsule 5   Cetirizine HCl (ZYRTEC ALLERGY) 10 MG CAPS Take 10 mg by mouth in the morning.     Cholecalciferol (VITAMIN D3) 50 MCG (2000 UT) capsule Take 2,000 Units by mouth daily.     diclofenac (VOLTAREN) 75 MG EC tablet Take 75 mg by mouth 2 (two) times daily.     ipratropium (ATROVENT ) 0.03 % nasal spray Place 1 spray into both nostrils in the morning and at bedtime.     methocarbamol  (ROBAXIN ) 750 MG tablet Take 1 tablet (750 mg total) by mouth every 6 (six) hours as needed for muscle spasms. 60 tablet 0   metoprolol  tartrate (LOPRESSOR ) 25 MG tablet TAKE 0.5 TABLETS (12.5 MG TOTAL) BY MOUTH 2 (TWO) TIMES DAILY AS NEEDED (SVT FLARE UPS). 90 tablet 2   pantoprazole  (PROTONIX ) 40 MG tablet Take 40 mg by mouth daily.     RABEprazole (ACIPHEX) 20 MG tablet Take 20 mg by mouth every morning.     semaglutide-weight management (WEGOVY) 0.5 MG/0.5ML SOAJ SQ injection Inject 0.5 mg into the skin once a week.     sertraline  (ZOLOFT ) 100 MG tablet Take 200 mg by mouth in the morning.     traZODone  (DESYREL ) 50 MG tablet Take 100 mg by mouth at bedtime as needed.     No current facility-administered medications for this visit.    REVIEW OF SYSTEMS:    10 Point review of Systems was done is negative except as noted above.  PHYSICAL EXAMINATION: ECOG  PERFORMANCE STATUS: 1 - Symptomatic but completely ambulatory  Vitals:   06/09/24 0941  BP: 137/87  Pulse: 87  Resp: 20  Temp: (!) 97 F (36.1 C)  SpO2: 100%   Filed Weights   06/09/24 0941  Weight: 258 lb 14.4 oz (117.4 kg)  Body mass index is 40.55 kg/m.  GENERAL:alert, in no acute distress and comfortable SKIN: no acute rashes, no significant lesions EYES: conjunctiva are pink and non-injected, sclera anicteric OROPHARYNX: MMM, no exudates, no oropharyngeal erythema or ulceration NECK: supple, no JVD LYMPH:  no palpable lymphadenopathy in the cervical, axillary or inguinal regions LUNGS: clear to auscultation b/l with normal respiratory effort HEART: regular rate & rhythm ABDOMEN:  normoactive bowel sounds , non tender, not distended. Extremity: no pedal edema PSYCH: alert & oriented x 3 with fluent speech NEURO: no focal motor/sensory deficits   LABORATORY DATA:  I have reviewed the data as listed     Latest Ref Rng & Units 06/09/2024    9:10 AM 06/03/2024    8:49 AM 05/22/2024    9:28 AM  CBC EXTENDED  WBC 4.0 - 10.5 K/uL 4.4  4.8  5.6   RBC 3.87 - 5.11 MIL/uL 4.12  4.33  4.13   Hemoglobin 12.0 - 15.0 g/dL 88.9  88.3  89.1   HCT 36.0 - 46.0 % 34.2  37.5  36.1   Platelets 150 - 400 K/uL 212  242  188   NEUT# 1.7 - 7.7 K/uL 2.3   2,699   Lymph# 0.7 - 4.0 K/uL 1.9          Latest Ref Rng & Units 06/09/2024    9:10 AM 06/03/2024    8:49 AM 05/22/2024    9:28 AM  CMP  Glucose 70 - 99 mg/dL 78  74  63  BUN 6 - 20 mg/dL 13  14  19    Creatinine 0.44 - 1.00 mg/dL 9.29  9.22  9.09   Sodium 135 - 145 mmol/L 141  140  142   Potassium 3.5 - 5.1 mmol/L 3.9  4.5  4.6   Chloride 98 - 111 mmol/L 111  108  109   CO2 22 - 32 mmol/L 25  21  28    Calcium  8.9 - 10.3 mg/dL 9.3  9.5  9.1   Total Protein 6.5 - 8.1 g/dL 6.7   6.3   Total Bilirubin 0.0 - 1.2 mg/dL 0.3   0.2   Alkaline Phos 38 - 126 U/L 138     AST 15 - 41 U/L 24   23   ALT 0 - 44 U/L 23   41    Iron   Panel Lab Results  Component Value Date   IRON  79 02/13/2024   UIBC 212 02/13/2024   TIBC 291 02/13/2024   IRONPCTSAT 27 02/13/2024   FERRITIN 276 02/13/2024    Component     Latest Ref Rng 09/18/2023  Folate, Hemolysate     Not Estab. ng/mL 460.0   HCT     34.0 - 46.6 % 30.6 (L)   Folate, RBC     >498 ng/mL 1,503   T4,Free(Direct)     0.61 - 1.12 ng/dL 9.30   TSH     9.649 - 4.500 uIU/mL 1.038   Vitamin B12     180 - 914 pg/mL 594    Component     Latest Ref Rng 06/19/2022  PTT Lupus Anticoagulant     0.0 - 43.5 sec 28.4   DRVVT     0.0 - 47.0 sec 30.3   Lupus Anticoag Interp No lupus anticoagulant was detected.     RADIOGRAPHIC STUDIES: I have personally reviewed the radiological images as listed and agreed with the findings in the report. DG C-Arm 1-60 Min-No Report Result Date: 05/19/2024 Fluoroscopy was utilized by the requesting physician.  No radiographic interpretation.   DG C-Arm 1-60 Min-No Report Result Date: 05/19/2024 Fluoroscopy was utilized by the requesting physician.  No radiographic interpretation.   Surgical Pathology 10/19/2023  CASE: [id number] PATIENT: JULIANNA DOSS Surgical Pathology Report   Clinical History: [redacted]w[redacted]d (las)   FINAL MICROSCOPIC DIAGNOSIS:  A. PLACENTA, DELIVERY: Preterm placenta, 319 g Intervillous thrombus Three-vessel umbilical cord   GROSS DESCRIPTION: A. Specimen received: fresh and subsequently placed in formalin labeled with the patient's name and DOB is a singleton placenta. Size and shape: discoid, 14.5 x 13.0 x 2.7 cm Umbilical cord: eccentrically inserted, 27.3 x 1.0 cm, white-tan, and contains 3 vessels grossly. Membranes: red-tan, slightly thickened, and minimally translucent. Circummarginate insertion. Weight: 319 g Fetal surface: blue-tan with mildly prominent vasculature. Maternal surface: intact with muted cotyledons. Cut surfaces: red-brown and spongy with a 1.3 cm tan, stratified  lesion. Block summary:  1: proximal and distal cord and membrane roll  2: central disc near cord insertion  3: peripheral disc  (LEF 10/22/2023)   Final Diagnosis performed by Norleen Dover, MD.   Electronically signed 10/23/2023 Technical component performed at [facility], [address] [place], [place] [address].  Professional component performed at [employer]. [facility], [address], [place], [place] [address].  Immunohistochemistry Technical component (if applicable) was performed at [facility]. [address], [place], [place] [address].   IMMUNOHISTOCHEMISTRY DISCLAIMER (if applicable): Some of these immunohistochemical stains may have been  developed and the performance characteristics determine by [facility]. Some may not have been cleared or approved by the U.S. Food and Drug Administration. The FDA has determined that such clearance or approval is not necessary. This test is used for clinical purposes. It should not be regarded as investigational or for research. This laboratory is certified under the Clinical Laboratory Improvement Amendments of 1988 (CLIA-88) as qualified to perform high complexity clinical laboratory testing.  The controls stained appropriately.   IHC stains are performed on formalin fixed, paraffin embedded tissue using a 3,3diaminobenzidine (DAB) chromogen and Leica Bond Autostainer System. The staining intensity of the nucleus is score manually and is reported as the percentage of tumor cell nuclei demonstrating specific nuclear staining. The specimens are fixed in 10% Neutral Formalin for at least 6 hours and up to 72hrs. These tests are validated on decalcified tissue. Results should be interpreted with caution given the possibility of false negative results on decalcified specimens. Antibody Clones are as follows ER-clone 24F, PR-clone 16, Ki67- clone [id number]. Some of  these immunohistochemical stains may have been developed and the performance characteristics determined by [facility].      ASSESSMENT & PLAN:   48 y.o. female with:  Anemia  B12 deficiency Recurrent miscarriages  PLAN: - Discussed lab results on 06/09/2024 in detail with patient: CBC showed WBC of 4K, Hemoglobin of 11.0 decreased from 11.6, and PLTs of 212K. CMP stable.  - Reviewed surgical pathology 10/19/2023:  Preterm placenta, 319 g Intervillous thrombus Three-vessel umbilical cord  - Reviewed 05/2022 hyper-coagulative testing  - Ordering additional hypercoagulability lab work-up Orders Placed This Encounter  Procedures   Antithrombin III   Protein C activity   Protein C, total   Protein S activity   Protein S, total   Lupus anticoagulant panel   Beta-2-glycoprotein i abs, IgG/M/A   Factor 5 leiden   Prothrombin gene mutation   Cardiolipin antibodies, IgG, IgM, IgA    FOLLOW-UP: Additional labs today Phone visit with Dr. Onesimo in 2 to 3 weeks to discuss hypercoagulability lab workup  The total time spent in the appointment was 20 minutes* .  All of the patient's questions were answered with apparent satisfaction. The patient knows to call the clinic with any problems, questions or concerns.   Emaline Onesimo MD MS AAHIVMS [facility] Hematology/Oncology Physician [facility]  *Total Encounter Time as defined by the Centers for Medicare and Medicaid Services includes, in addition to the face-to-face time of a patient visit (documented in the note above) non-face-to-face time: obtaining and reviewing outside history, ordering and reviewing medications, tests or procedures, care coordination (communications with other health care professionals or caregivers) and documentation in the medical record.    I, Damien Blanks, acting as a neurosurgeon for Emaline Onesimo, MD.,have documented all relevant documentation on the behalf of Emaline Onesimo, MD,as directed  by  Emaline Onesimo, MD while in the presence of Emaline Onesimo, MD.  I have reviewed the above documentation for accuracy and completeness, and I agree with the above. Emaline Candida Onesimo MD

## 2024-06-11 LAB — BETA-2-GLYCOPROTEIN I ABS, IGG/M/A
Beta-2 Glyco I IgG: <9 GPI IgG units (ref 0–20)
Beta-2-Glycoprotein I IgA: <9 GPI IgA units (ref 0–25)
Beta-2-Glycoprotein I IgM: <9 GPI IgM units (ref 0–32)

## 2024-06-11 LAB — LUPUS ANTICOAGULANT PANEL
DRVVT: 33 s (ref 0.0–47.0)
PTT Lupus Anticoagulant: 36.6 s (ref 0.0–43.5)

## 2024-06-11 LAB — PROTEIN C, TOTAL: Protein C, Total: 62 % (ref 60–150)

## 2024-06-11 LAB — PROTEIN S ACTIVITY: Protein S Activity: 81 % (ref 63–140)

## 2024-06-11 LAB — PROTEIN C ACTIVITY: Protein C Activity: 75 % (ref 73–180)

## 2024-06-11 LAB — PROTEIN S, TOTAL: Protein S Ag, Total: 82 % (ref 60–150)

## 2024-06-12 LAB — CARDIOLIPIN ANTIBODIES, IGG, IGM, IGA
Anticardiolipin IgA: <9 U/mL (ref 0–11)
Anticardiolipin IgG: <9 GPL U/mL (ref 0–14)
Anticardiolipin IgM: 12 [MPL'U]/mL (ref 0–12)

## 2024-06-13 ENCOUNTER — Encounter: Payer: Self-pay | Admitting: Hematology

## 2024-06-13 ENCOUNTER — Ambulatory Visit (HOSPITAL_COMMUNITY): Admit: 2024-06-13 | Admitting: Urology

## 2024-06-13 ENCOUNTER — Telehealth: Payer: Self-pay | Admitting: *Deleted

## 2024-06-13 SURGERY — CYSTOSCOPY/URETEROSCOPY/HOLMIUM LASER/STENT PLACEMENT
Anesthesia: General | Laterality: Right

## 2024-06-15 ENCOUNTER — Encounter: Payer: Self-pay | Admitting: Hematology

## 2024-06-16 ENCOUNTER — Other Ambulatory Visit

## 2024-06-16 ENCOUNTER — Ambulatory Visit: Admitting: Hematology

## 2024-06-16 ENCOUNTER — Telehealth: Payer: Self-pay | Admitting: Cardiology

## 2024-06-16 ENCOUNTER — Encounter: Payer: Self-pay | Admitting: Hematology

## 2024-06-16 LAB — FACTOR 5 LEIDEN

## 2024-06-16 LAB — PROTHROMBIN GENE MUTATION

## 2024-06-16 NOTE — Pre-Procedure Instructions (Signed)
 Instructed patient on the following items: Arrival time 0930 Nothing to eat or drink after midnight No meds AM of procedure Responsible person to drive you home and stay with you for 24 hrs  Wegovy- last dose over a month ago.  Metoprolol - last dose was Friday 10/24

## 2024-06-16 NOTE — Telephone Encounter (Signed)
 Late Entry: Spoke to pt Friday evening 10/24 and made her aware of insurance denial.  She was told then we would contact her to reschedule.  This morning we received approval for procedure tomorrow and pt would like to proceed.  Aware we will place back on and instructions remain the same.  She is very excited we can proceed, and agreeable to plan.

## 2024-06-16 NOTE — Telephone Encounter (Signed)
 Pt calling to speak with Sarah Saunders about rescheduling her procedure. She would like to schedule it within the approval date from her insurance if possible. Please advise.

## 2024-06-16 NOTE — Telephone Encounter (Signed)
 I see a clearance request in the chart from 05/15/24. I feel though this is regard to upcoming procedure with Dr. Inocencio for an ablation, as we do not have an April in preop.   I will forward this message to April Garrison who schedules procedures for the EP providers.

## 2024-06-17 ENCOUNTER — Other Ambulatory Visit: Payer: Self-pay

## 2024-06-17 ENCOUNTER — Encounter (HOSPITAL_COMMUNITY): Payer: Self-pay | Admitting: Cardiology

## 2024-06-17 ENCOUNTER — Ambulatory Visit (HOSPITAL_COMMUNITY): Admission: RE | Admit: 2024-06-17 | Source: Home / Self Care | Admitting: Cardiology

## 2024-06-17 ENCOUNTER — Ambulatory Visit (HOSPITAL_COMMUNITY)
Admission: RE | Admit: 2024-06-17 | Discharge: 2024-06-17 | Disposition: A | Discharge status: Home / Self Care | Attending: Cardiology | Admitting: Cardiology

## 2024-06-17 ENCOUNTER — Encounter (HOSPITAL_COMMUNITY): Admission: RE | Disposition: A | Payer: Self-pay | Source: Home / Self Care | Attending: Cardiology

## 2024-06-17 ENCOUNTER — Encounter (HOSPITAL_COMMUNITY): Admission: RE | Payer: Self-pay | Source: Home / Self Care

## 2024-06-17 ENCOUNTER — Ambulatory Visit (HOSPITAL_COMMUNITY)

## 2024-06-17 DIAGNOSIS — I471 Supraventricular tachycardia, unspecified: Secondary | ICD-10-CM | POA: Insufficient documentation

## 2024-06-17 DIAGNOSIS — Z9884 Bariatric surgery status: Secondary | ICD-10-CM | POA: Diagnosis not present

## 2024-06-17 HISTORY — PX: SVT ABLATION: EP1225

## 2024-06-17 LAB — PREGNANCY, URINE: Preg Test, Ur: NEGATIVE

## 2024-06-17 SURGERY — SVT ABLATION
Anesthesia: Monitor Anesthesia Care

## 2024-06-17 SURGERY — SVT ABLATION
Anesthesia: General

## 2024-06-17 MED ORDER — SODIUM CHLORIDE 0.9% FLUSH: 3.0000 mL | INTRAVENOUS | Status: DC | PRN

## 2024-06-17 MED ORDER — ONDANSETRON HCL 4 MG/2ML IJ SOLN: 4.0000 mg | Freq: Four times a day (QID) | INTRAMUSCULAR | Status: DC | PRN

## 2024-06-17 MED ORDER — BUPIVACAINE HCL (PF) 0.25 % IJ SOLN
INTRAMUSCULAR | Status: AC
  Filled 2024-06-17: qty 30

## 2024-06-17 MED ORDER — FENTANYL CITRATE (PF) 100 MCG/2ML IJ SOLN
INTRAMUSCULAR | Status: AC
  Filled 2024-06-17: qty 2

## 2024-06-17 MED ORDER — SODIUM CHLORIDE 0.9 % IV SOLN: 250.0000 mL | INTRAVENOUS | Status: DC | PRN

## 2024-06-17 MED ORDER — MIDAZOLAM HCL 2 MG/2ML IJ SOLN
INTRAMUSCULAR | Status: AC
  Filled 2024-06-17: qty 2

## 2024-06-17 MED ORDER — FENTANYL CITRATE (PF) 100 MCG/2ML IJ SOLN
INTRAMUSCULAR | Status: DC | PRN
  Administered 2024-06-17: 50 ug via INTRAVENOUS
  Administered 2024-06-17: 100 ug via INTRAVENOUS
  Administered 2024-06-17: 50 ug via INTRAVENOUS

## 2024-06-17 MED ORDER — ISOPROTERENOL HCL 0.2 MG/ML IJ SOLN
INTRAVENOUS | Status: DC | PRN
  Administered 2024-06-17: 2 ug/min via INTRAVENOUS

## 2024-06-17 MED ORDER — SODIUM CHLORIDE 0.9 % IV SOLN: INTRAVENOUS | Status: DC

## 2024-06-17 MED ORDER — ACETAMINOPHEN 325 MG PO TABS: 650.0000 mg | ORAL_TABLET | ORAL | Status: DC | PRN

## 2024-06-17 MED ORDER — ISOPROTERENOL HCL 0.2 MG/ML IJ SOLN
INTRAMUSCULAR | Status: AC
  Filled 2024-06-17: qty 5

## 2024-06-17 MED ORDER — BUPIVACAINE HCL (PF) 0.25 % IJ SOLN
INTRAMUSCULAR | Status: DC | PRN
  Administered 2024-06-17: 30 mL

## 2024-06-17 MED ORDER — MIDAZOLAM HCL (PF) 2 MG/2ML IJ SOLN
INTRAMUSCULAR | Status: DC | PRN
  Administered 2024-06-17 (×2): 2 mg via INTRAVENOUS

## 2024-06-17 MED ORDER — HEPARIN (PORCINE) IN NACL 1000-0.9 UT/500ML-% IV SOLN
INTRAVENOUS | Status: DC | PRN
  Administered 2024-06-17: 500 mL

## 2024-06-17 SURGICAL SUPPLY — 11 items
CATH DECANAV D CURVE (CATHETERS) IMPLANT
CATH JOSEPH QUAD ALLRED 6F REP (CATHETERS) IMPLANT
CLOSURE MYNX CONTROL 6F/7F (Vascular Products) IMPLANT
PACK EP LF (CUSTOM PROCEDURE TRAY) ×1 IMPLANT
PAD DEFIB RADIO PHYSIO CONN (PAD) ×1 IMPLANT
PATCH CARTO3 (PAD) IMPLANT
SHEATH PINNACLE 6F 10CM (SHEATH) IMPLANT
SHEATH PINNACLE 7F 10CM (SHEATH) IMPLANT
SHEATH PINNACLE 8F 10CM (SHEATH) IMPLANT
SHEATH PROBE COVER 6X72 (BAG) IMPLANT
TUBING SMART ABLATE COOLFLOW (TUBING) IMPLANT

## 2024-06-17 NOTE — Discharge Instructions (Signed)

## 2024-06-17 NOTE — Anesthesia Preprocedure Evaluation (Addendum)
 Anesthesia Evaluation  Patient identified by MRN, date of birth, ID band Patient awake    Reviewed: Allergy & Precautions, NPO status , Patient's Chart, lab work & pertinent test results  Airway Mallampati: II       Dental  (+) Missing   Pulmonary neg pulmonary ROS   Pulmonary exam normal        Cardiovascular Normal cardiovascular exam+ dysrhythmias Supra Ventricular Tachycardia   ECHO: 1. Left ventricular ejection fraction, by estimation, is 55 to 60%. Left  ventricular ejection fraction by 3D volume is 60 %. The left ventricle has  normal function. The left ventricle has no regional wall motion  abnormalities. Left ventricular diastolic   parameters were normal. The average left ventricular global longitudinal  strain is -20.4 %. The global longitudinal strain is normal.   2. Right ventricular systolic function is normal. The right ventricular  size is normal.   3. The mitral valve is normal in structure. No evidence of mitral valve  regurgitation. No evidence of mitral stenosis.   4. The aortic valve is tricuspid. Aortic valve regurgitation is not  visualized. No aortic stenosis is present.   5. Moderately dilated pulmonary artery.   6. The inferior vena cava is normal in size with greater than 50%  respiratory variability, suggesting right atrial pressure of 3 mmHg.     Neuro/Psych  Headaches  Anxiety     Pituitary tumor    GI/Hepatic negative GI ROS, Neg liver ROS,,,  Endo/Other  Polycystic ovary disease Patient on GLP-1 Agonist  Renal/GU negative Renal ROS     Musculoskeletal negative musculoskeletal ROS (+)    Abdominal  (+) + obese  Peds  Hematology  (+) Blood dyscrasia, anemia   Anesthesia Other Findings svt  Reproductive/Obstetrics                              Anesthesia Physical Anesthesia Plan  ASA: 3  Anesthesia Plan: MAC   Post-op Pain Management:     Induction:   PONV Risk Score and Plan: 2 and Ondansetron , Dexamethasone , Midazolam  and Treatment may vary due to age or medical condition  Airway Management Planned: Simple Face Mask  Additional Equipment:   Intra-op Plan:   Post-operative Plan:   Informed Consent: I have reviewed the patients History and Physical, chart, labs and discussed the procedure including the risks, benefits and alternatives for the proposed anesthesia with the patient or authorized representative who has indicated his/her understanding and acceptance.     Dental advisory given  Plan Discussed with: CRNA  Anesthesia Plan Comments:          Anesthesia Quick Evaluation

## 2024-06-17 NOTE — Progress Notes (Signed)
 Patient and husband was given discharge instructions. Both verbalized understanding.

## 2024-06-17 NOTE — Interval H&P Note (Signed)
 History and Physical Interval Note:  06/17/2024 10:06 AM  Sarah Saunders  has presented today for surgery, with the diagnosis of svt.  The various methods of treatment have been discussed with the patient and family. After consideration of risks, benefits and other options for treatment, the patient has consented to  Procedure(s): SVT ABLATION (N/A) as a surgical intervention.  The patient's history has been reviewed, patient examined, no change in status, stable for surgery.  I have reviewed the patient's chart and labs.  Questions were answered to the patient's satisfaction.     Jatziri Goffredo [employer]

## 2024-06-17 NOTE — Anesthesia Postprocedure Evaluation (Signed)
 Anesthesia Post Note  Patient: Sarah Saunders  Procedure(s) Performed: SVT ABLATION     Patient location during evaluation: Cath Lab Anesthesia Type: MAC Level of consciousness: awake Pain management: pain level controlled Vital Signs Assessment: post-procedure vital signs reviewed and stable Respiratory status: spontaneous breathing, nonlabored ventilation and respiratory function stable Cardiovascular status: blood pressure returned to baseline and stable Postop Assessment: no apparent nausea or vomiting Anesthetic complications: no   There were no known notable events for this encounter.  Last Vitals:  Vitals:   06/17/24 1500 06/17/24 1530  BP: 108/62 (!) 112/57  Pulse: 63 70  Resp: 17 20  Temp:    SpO2: 98% 95%    Last Pain:  Vitals:   06/17/24 1355  TempSrc: Oral   Pain Goal:                   Bernardino SQUIBB Glenice Ciccone

## 2024-06-17 NOTE — Transfer of Care (Signed)
 Immediate Anesthesia Transfer of Care Note  Patient: Sarah Saunders  Procedure(s) Performed: SVT ABLATION  Patient Location: PACU  Anesthesia Type:MAC  Level of Consciousness: awake, alert , and oriented  Airway & Oxygen Therapy: Patient Spontanous Breathing  Post-op Assessment: Report given to RN and Post -op Vital signs reviewed and stable  Post vital signs: Reviewed and stable  Last Vitals:  Vitals Value Taken Time  BP 126/71 06/17/24 14:15  Temp 36.3 C 06/17/24 13:55  Pulse 57 06/17/24 14:20  Resp 15 06/17/24 14:20  SpO2 97 % 06/17/24 14:20  Vitals shown include unfiled device data.  Last Pain:  Vitals:   06/17/24 1355  TempSrc: Oral         Complications: There were no known notable events for this encounter.

## 2024-06-18 ENCOUNTER — Telehealth (HOSPITAL_COMMUNITY): Payer: Self-pay

## 2024-06-18 NOTE — Telephone Encounter (Signed)
 Spoke with patient to complete post procedure follow up call.  Patient reports no complications with groin sites.   Instructions reviewed with patient:  Remove large bandage at puncture site after 24 hours. It is normal to have bruising, tenderness, mild swelling, and a pea or marble sized lump/knot at the groin site which can take up to three months to resolve.  Get help right away if you notice sudden swelling at the puncture site.  Check your puncture site every day for signs of infection: fever, redness, swelling, pus drainage, warmth, foul odor or excessive pain. If this occurs, please call [phone], to speak with the RN Navigator. Get help right away if your puncture site is bleeding and the bleeding does not stop after applying firm pressure to the area.  You will follow up with the APP 4 weeks after your procedure.  Activity restrictions reviewed.  Patient verbalized understanding to all instructions provided.

## 2024-06-30 ENCOUNTER — Inpatient Hospital Stay: Attending: Hematology | Admitting: Hematology

## 2024-06-30 DIAGNOSIS — E538 Deficiency of other specified B group vitamins: Secondary | ICD-10-CM | POA: Insufficient documentation

## 2024-06-30 DIAGNOSIS — N96 Recurrent pregnancy loss: Secondary | ICD-10-CM | POA: Diagnosis not present

## 2024-06-30 DIAGNOSIS — D509 Iron deficiency anemia, unspecified: Secondary | ICD-10-CM | POA: Diagnosis not present

## 2024-06-30 NOTE — Progress Notes (Signed)
 HEMATOLOGY ONCOLOGY PROGRESS NOTE  Date of service: 06/30/2024  Patient Care Team: Verdia Lombard, MD as PCP - General (Internal Medicine) Elmira Newman PARAS, MD as PCP - Cardiology (Cardiology) Inocencio Soyla Lunger, MD as PCP - Electrophysiology (Cardiology)  CHIEF COMPLAINT/PURPOSE OF CONSULTATION: Follow-up for continued evaluation and management of Anemia  HISTORY OF PRESENTING ILLNESS: (12/13/2016) Sarah Saunders is a wonderful 48 y.o. female who has been referred to us  by Dr .VERDIA LOMBARD, MD  for evaluation and management of iron  deficiency anemia not responsive to oral iron  therapy.    Patient has a history of prolactinoma, polycystic ovarian disease, morbid obesity, previous history of iron  deficiency, GERD on chronic PPI therapy who is undergoing evaluation for bariatric surgery with Dr. Wilfred Guan at [facility]   As part of her pre-bariatric surgery evaluation (plan for duodenal switch surgery) she was noted to have zinc deficiency, iron  deficiency. She notes that she has been on ferrous sulfate  1 tablet by mouth twice a day since December 2017 with no improvement in her hemoglobin or ferritin levels.     She notes that she has been on AcipHex for years. She notes no overt GI bleeding. Notes that she has had no menstrual losses for about 5 months and that her periods have been fairly irregular due to her PCOS.   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 Recent labs with her primary care physician  from 10/30/2016 showed a hemoglobin of 9.5 with an MCV of 77 elevated RDW of 18.5 with normal W BC count of 11.2k and platelets of 312k. Iron  studies were not available in referable information that she had an iron  saturation of about 8% earlier this year.   No other acute new symptoms at this time. She notes that her bariatric medicine team has told her that they cannot proceed for surgical planning  until her anemia and iron  deficiency are completely corrected.   She notes that she is on zinc and copper replacement as per her bariatric medicine team.   Has not had any overt evidence of B12 deficiency.   Currently he is taking ferrous sulfate  1 tablet by mouth twice a day with vitamin C without improvement in her anemia. Patient notes that she has never required previous blood transfusions or IV iron .   We discussed about the role of IV iron  replacement in details and the possible etiologies for her iron  deficiency anemia including poor absorption due to chronic PPI therapy. She has been on high doses of ibuprofen  for one acute pains and is also on SSRI and we discussed that this combination could significantly increase her risk of GI bleeding. She was recommended to discuss this with her primary care physician.   (09/18/2023) Sarah Saunders is a wonderful 48 y.o. female who has been referred to us  by CNM Orie Bonus for evaluation and management of Anemia. Hx of chronic anemia, bariatric surgery. She has had iron  infusions prior pregnancy.    Labs from 07/15/2023 showed patient is anemic with hgb of 9.9 g/dL with hct of 66%. No recent iron  labs.   Patient was previously seen by us  in 12/13/2016 for iron  deficiency anemia. She had IV Injectafer  750 mg weekly for 2 dose for surgeries. She was recommended to follow-up with us  8 weeks after initial visit. Patient did not follow-up.   Patient is currently 26 weeks 4d pregnant. She reports this is her 5th pregnancy. Still birth at  26 week pregnancy in 2023, unsure of the cause. G5P1. Patient notes she has been doing well overall since our last visit. She had her bariatric surgery and she follows-up with her PCP for Vitamin B-12 levels. She had lost around 170 lbs before her 2023 pregnancy.    Current medications: 500 mg Vitamin B-12 sublingually, pre-natal vitamin, and Vitamin D-3 supplement. NU Iron  once daily, started by OGYN around one  month ago. She has been tolerating NU Iron  well without any toxicities. Denies calcium  supplement.    She complains of significant fatigue and chronic hip pain. She also complains of sleep depravation. She occasionally takes Benadryl  to help her sleep.    She denies any new infection issues, fever, chills, night sweats, unexpected weight loss, back pain, chest pain, SOB, abdominal pain, or leg swelling.    Patient notes she has been eating well, gained around 13-14 lbs.    FmHx of anemia - mother. Denies any FmHx of blood disorders.    She has been anemic since she was 18. She denies any PmHx of blood disorder.    She has PmHx of kidney stones. She currently has occasional microscopic blood. She currently has 2 kidney stones in her right kidney.    She denies any IV Iron  infusions since 2018.   INTERVAL HISTORY: I connected with Sarah Saunders on 06/30/2024 at  8:40 AM EST by telephone and verified that I am speaking with the correct person using two identifiers.   I discussed the limitations, risks, security and privacy concerns of performing an evaluation and management service by telemedicine and the availability of in-person appointments. I also discussed with the patient that there may be a patient responsible charge related to this service. The patient expressed understanding and agreed to proceed.   Other persons participating in the visit and their role in the encounter: Medical Scribe, Damien Blanks.  Patient's location: Home Provider's location: [facility]   Chief Complaint: continued evaluation and management of Anemia  she was last seen by me on 06/09/2024; at the time she noted that her periods had not restarted and that she may resume IVF treatments next year.  Today, she says that she's been doing well. I reviewed her hypercoagulability work-up with her. She does report that she is not on any vitamins, just IV iron  through this office with the last one being in April or May,  but does still take an acid suppressant. She notes that she will undergo IVF sometime next year as well.   REVIEW OF SYSTEMS:   10 Point review of systems of done and is negative except as noted above.  MEDICAL HISTORY Past Medical History:  Diagnosis Date   Allergic rhinitis 02/27/2008   Qualifier: Diagnosis of   By: Krystal MD, Reyes LABOR     IMO SNOMED Dx Update Oct 2024     Anemia    iron  def   Antepartum fetal death    Anxiety    Arthritis    Genital herpes    GERD (gastroesophageal reflux disease)    History of bariatric surgery    History of kidney stones    History of left hip replacement    History of urinary stone    Migraines    Morbid obesity (HCC) 10/09/2016   Pituitary tumor    Polycystic ovary disease    Prolactinoma (HCC)    SVT (supraventricular tachycardia)    Vitamin D deficiency     SURGICAL HISTORY Past Surgical History:  Procedure Laterality Date   ANTERIOR HIP REVISION Right 02/05/2024   Procedure: REVISION, TOTAL ARTHROPLASTY, HIP, ANTERIOR APPROACH;  Surgeon: Beverley Evalene BIRCH, MD;  Location: [facility];  Service: Orthopedics;  Laterality: Right;   CESAREAN SECTION N/A 10/19/2023   Procedure: CESAREAN SECTION;  Surgeon: Herchel Gloris LABOR, MD;  Location: [facility] LD [facility];  Service: Obstetrics;  Laterality: N/A;   CYSTOSCOPY W/ RETROGRADES Right 05/19/2024   Procedure: CYSTOSCOPY, WITH RETROGRADE PYELOGRAM;  Surgeon: Selma Donnice SAUNDERS, MD;  Location: [facility];  Service: Urology;  Laterality: Right;   CYSTOSCOPY/URETEROSCOPY/HOLMIUM LASER/STENT PLACEMENT Right 07/06/2023   Procedure: CYSTOSCOPY/RIGHT RETROGRADE URETEROSCOPY/HOLMIUM LASER/STENT PLACEMENT;  Surgeon: Selma Donnice SAUNDERS, MD;  Location: [facility];  Service: Urology;  Laterality: Right;   CYSTOSCOPY/URETEROSCOPY/HOLMIUM LASER/STENT PLACEMENT Right 05/19/2024   Procedure: CYSTOSCOPY/URETEROSCOPY/HOLMIUM LASER/STENT PLACEMENT;  Surgeon: Selma Donnice SAUNDERS, MD;  Location: [facility];  Service: Urology;  Laterality: Right;    EXTRACORPOREAL SHOCK WAVE LITHOTRIPSY Right 04/07/2024   Procedure: LITHOTRIPSY, ESWL;  Surgeon: Selma Donnice SAUNDERS, MD;  Location: [facility];  Service: Urology;  Laterality: Right;   JOINT REPLACEMENT Left 01/13/2021   hip   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)     SVT ABLATION N/A 06/17/2024   Procedure: SVT ABLATION;  Surgeon: Inocencio Soyla Lunger, MD;  Location: [facility];  Service: Cardiovascular;  Laterality: N/A;   TOTAL HIP ARTHROPLASTY Right    tube removed     right fallopian tube removed    SOCIAL HISTORY Social History   Tobacco Use   Smoking status: Never   Smokeless tobacco: Never  Vaping Use   Vaping status: Never Used  Substance Use Topics   Alcohol  use: No   Drug use: No    Social History   Social History Narrative   Lives with husband.    SOCIAL DRIVERS OF HEALTH SDOH Screenings   Food Insecurity: No Food Insecurity (02/05/2024)  Housing: Low Risk  (02/05/2024)  Transportation Needs: No Transportation Needs (02/05/2024)  Utilities: Not At Risk (02/05/2024)  Social Connections: Unknown (02/05/2024)  Tobacco Use: Low Risk  (06/03/2024)     FAMILY HISTORY Family History  Problem Relation Age of Onset   Hypertension Father    Heart failure Father    Hypertension Mother    Heart failure Brother      ALLERGIES: is allergic to butorphanol and other.  MEDICATIONS  Current Outpatient Medications  Medication Sig Dispense Refill   acetaminophen  (TYLENOL ) 325 MG tablet Take 2 tablets (650 mg total) by mouth every 6 (six) hours. 30 tablet 0   albuterol  (PROVENTIL  HFA;VENTOLIN  HFA) 108 (90 BASE) MCG/ACT inhaler Inhale 2 puffs into the lungs every 4 (four) hours as needed for wheezing or shortness of breath.     amoxicillin  (AMOXIL ) 500 MG capsule Take 1 capsule (500 mg total) by mouth 2 (two) times daily. 60 capsule 5   Cetirizine HCl (ZYRTEC ALLERGY) 10 MG CAPS Take 10 mg by mouth in the morning.     Cholecalciferol  (VITAMIN D3) 50 MCG (2000 UT) capsule Take 2,000 Units by mouth daily.     diclofenac (VOLTAREN) 75 MG EC tablet Take 75 mg by mouth 2 (two) times daily.     fluticasone (FLONASE) 50 MCG/ACT nasal spray Place 1 spray into both nostrils daily.     ipratropium (ATROVENT ) 0.03 % nasal spray Place 1 spray into both nostrils in the morning and at bedtime.     methocarbamol  (ROBAXIN ) 750 MG tablet Take 1 tablet (750 mg total)  by mouth every 6 (six) hours as needed for muscle spasms. 60 tablet 0   metoprolol  tartrate (LOPRESSOR ) 25 MG tablet TAKE 0.5 TABLETS (12.5 MG TOTAL) BY MOUTH 2 (TWO) TIMES DAILY AS NEEDED (SVT FLARE UPS). 90 tablet 2   RABEprazole (ACIPHEX) 20 MG tablet Take 20 mg by mouth daily.     semaglutide-weight management (WEGOVY) 0.5 MG/0.5ML SOAJ SQ injection Inject 0.5 mg into the skin once a week.     sertraline  (ZOLOFT ) 100 MG tablet Take 200 mg by mouth in the morning.     traZODone  (DESYREL ) 50 MG tablet Take 50-100 mg by mouth at bedtime as needed for sleep.     No current facility-administered medications for this visit.    PHYSICAL EXAMINATION TELEPHONE VISIT  LABORATORY DATA:   I have reviewed the data as listed     Latest Ref Rng & Units 06/09/2024    9:10 AM 06/03/2024    8:49 AM 05/22/2024    9:28 AM  CBC EXTENDED  WBC 4.0 - 10.5 K/uL 4.4  4.8  5.6   RBC 3.87 - 5.11 MIL/uL 4.12  4.33  4.13   Hemoglobin 12.0 - 15.0 g/dL 88.9  88.3  89.1   HCT 36.0 - 46.0 % 34.2  37.5  36.1   Platelets 150 - 400 K/uL 212  242  188   NEUT# 1.7 - 7.7 K/uL 2.3   2,699   Lymph# 0.7 - 4.0 K/uL 1.9      Iron  Studies:  Lab Results  Component Value Date   IRON  66 06/09/2024   UIBC 231 06/09/2024   TIBC 297 06/09/2024   IRONPCTSAT 22 06/09/2024   FERRITIN 74 06/09/2024      Vitamin B12:  Lab Results  Component Value Date   VITAMINB12 732 06/09/2024        Latest Ref Rng & Units 06/09/2024    9:10 AM 06/03/2024    8:49 AM 05/22/2024    9:28 AM  CMP  Glucose 70 - 99  mg/dL 78  74  63   BUN 6 - 20 mg/dL 13  14  19    Creatinine 0.44 - 1.00 mg/dL 9.29  9.22  9.09   Sodium 135 - 145 mmol/L 141  140  142   Potassium 3.5 - 5.1 mmol/L 3.9  4.5  4.6   Chloride 98 - 111 mmol/L 111  108  109   CO2 22 - 32 mmol/L 25  21  28    Calcium  8.9 - 10.3 mg/dL 9.3  9.5  9.1   Total Protein 6.5 - 8.1 g/dL 6.7   6.3   Total Bilirubin 0.0 - 1.2 mg/dL 0.3   0.2   Alkaline Phos 38 - 126 U/L 138     AST 15 - 41 U/L 24   23   ALT 0 - 44 U/L 23   41     06/09/2024  Anticardiolipin Ab,IgG,Qn     0 - 14 GPL U/mL <9   Anticardiolipin Ab,IgM,Qn     0 - 12 MPL U/mL 12   Anticardiolipin Ab,IgA,Qn     0 - 11 APL U/mL <9   Beta-2 Glycoprotein I Ab, IgG     0 - 20 GPI IgG units <9   Beta-2-Glycoprotein I IgM     0 - 32 GPI IgM units <9   Beta-2-Glycoprotein I IgA     0 - 25 GPI IgA units <9   PTT Lupus Anticoagulant     0.0 -  43.5 sec 36.6   DRVVT     0.0 - 47.0 sec 33.0   Lupus Anticoag Interp No lupus anticoagulant was detected.    Recommendations-PTGENE: *97G>A - Not Detected  This result is not associated with an increased risk for venous  thromboembolism.   Recommendations-F5LEID: 1601G>A (p.Arg534Gln) - Not Detected  This result is not associated with an increased risk for venous  thromboembolism.   Protein S, Total     60 - 150 % 82   Protein S-Functional     63 - 140 % 81   Protein C, Total     60 - 150 % 62   Protein C-Functional     73 - 180 % 75   Antithrombin Activity     75 - 120 % 102     Component     Latest Ref Rng 09/18/2023  Folate, Hemolysate     Not Estab. ng/mL 460.0   HCT     34.0 - 46.6 % 30.6 (L)   Folate, RBC     >498 ng/mL 1,503   T4,Free(Direct)     0.61 - 1.12 ng/dL 9.30   TSH     9.649 - 4.500 uIU/mL 1.038   Vitamin B12     180 - 914 pg/mL 594     Component     Latest Ref Rng 06/19/2022  PTT Lupus Anticoagulant     0.0 - 43.5 sec 28.4   DRVVT     0.0 - 47.0 sec 30.3   Lupus Anticoag Interp No lupus anticoagulant was  detected.     RADIOGRAPHIC STUDIES: I have personally reviewed the radiological images as listed and agreed with the findings in the report. EP STUDY Result Date: 06/17/2024 SURGEON: Soyla Norton, MD PREPROCEDURE DIAGNOSIS: SVT POSTPROCEDURE DIAGNOSIS: SVT PROCEDURES: 1. Comprehensive EP study. 2. Coronary sinus pacing and recording. INTRODUCTION: FLORELLA MCNEESE is a 48 y.o. female with a history of symptomatic recurrent short RP SVT who presents today for EP study and radiofrequency ablation. The patient has had recurrent symptomatic SVT.  She now presents for EP study and radiofrequency ablation of SVT. DESCRIPTION OF PROCEDURE: Informed written consent was obtained and the patient was brought to the Electrophysiology Lab in the fasting state. The patient was adequately sedated with intravenous medication as outlined in the anesthesia report. The patient's right neck and groin was prepped and draped in the usual sterile fashion by the EP Lab staff. Using a percutaneous Seldinger technique, one 8 French and one 6 French hemostasis sheath was placed in the right femoral vein, one 7 French and one 6 French hemostasis sheath was placed in the left femoral vein.  Two 6-French quadripolar Josephson catheters were introduced through the right and left common femoral vein and advanced into the His bundle and right ventricular apex positions for recording and pacing.  A Biosense Webster auto ID CS catheter was placed in the coronary sinus through the left femoral vein.  Direct ultrasound guidance is used for right and left femoral veins with normal vessel patency. Ultrasound images are captured and stored in the patient's chart. Using ultrasound guidance, the Brockenbrough needle and wire were visualized entering the vessel. Presenting Measurements: The patient presented to the Electrophysiology Lab in sinus rhythm. The PR interval was 150s msec with a QRS of 104 msec and a Qt of 450 msec. The average RR interval  was 1196 msec. The AH interval was 60 msec and the HV interval was 54 milliseconds. EP study: Ventricular  pacing was performed which reveals midline concentric decremental VA conduction with a single retrograde jump but no echo beats of tachycardias. The VA WCL was 360 msec. Rapid atrial pacing was performed with reveals PR < RR with tachycardia not induced at 440 msec. AEST was performed which revealed no AH jumps or echo beats.. Tachycardia was not induced. The AVNERP was 600/240 msec. Isuprel was infused at 2mcg/min with an adequate acceleration in HR response observed. PR remained < RR. Tachycardia was not induced. AEST was performed which again revealed a single multiple AH jumps with single echo beats.  Pacing was performed with drivetrain's of 500/260 and 500/220 ms.  Also drivetrain's at 400/200 ms were performed.  No tachycardia was induced.  Rapid atrial pacing was performed down to a cycle length of 240 ms without tachycardia.  Isoproterenol was stopped.  Further pacing for AV nodal ERP and AV winky block was performed without any tachycardia during isoproterenol washout.  CONCLUSIONS: 1. Sinus rhythm upon presentation. 2.  Negative EP study 3.  No early apparent complications.   DG C-Arm 1-60 Min-No Report Result Date: 05/19/2024 Fluoroscopy was utilized by the requesting physician.  No radiographic interpretation.   DG C-Arm 1-60 Min-No Report Result Date: 05/19/2024 Fluoroscopy was utilized by the requesting physician.  No radiographic interpretation.   DG Abd 1 View Result Date: 04/07/2024 CLINICAL DATA:  Right renal stone, pre lithotripsy. EXAM: ABDOMEN - 1 VIEW COMPARISON:  03/26/2024. FINDINGS: 12 mm stone in the right kidney with adjacent smaller stones, as before. No definite radiopaque calculi project over the left renal outline, expected course of the ureters or bladder. Moderate stool burden.  Bilateral hip arthroplasties. IMPRESSION: Right renal stones, unchanged. Electronically  Signed   By: Newell Eke M.D.   On: 04/07/2024 08:32     Surgical Pathology 10/19/2023  CASE: [phone] PATIENT: Sarah Saunders Surgical Pathology Report    Clinical History: [redacted]w[redacted]d (las)    FINAL MICROSCOPIC DIAGNOSIS:  A. PLACENTA, DELIVERY: Preterm placenta, 319 g Intervillous thrombus Three-vessel umbilical cord   GROSS DESCRIPTION: A. Specimen received: fresh and subsequently placed in formalin labeled with the patient's name and DOB is a singleton placenta. Size and shape: discoid, 14.5 x 13.0 x 2.7 cm Umbilical cord: eccentrically inserted, 27.3 x 1.0 cm, white-tan, and contains 3 vessels grossly. Membranes: red-tan, slightly thickened, and minimally translucent. Circummarginate insertion. Weight: 319 g Fetal surface: blue-tan with mildly prominent vasculature. Maternal surface: intact with muted cotyledons. Cut surfaces: red-brown and spongy with a 1.3 cm tan, stratified lesion. Block summary:   1: proximal and distal cord and membrane roll   2: central disc near cord insertion   3: peripheral disc  (LEF 10/22/2023)      ASSESSMENT & PLAN:   48 y.o. female with  Anemia  B12 deficiency Recurrent miscarriages  PLAN: - Discussed lab results on 06/09/2024 in detail with patient: CBC showed WBC of 4.4K, Hemoglobin of 11.0 decreased from 11.6, and PLTs of 212K.  Slight anemia, however she does take an acid suppressant, which could be contributory.  CMP stable.  Iron  Panel and Ferritin normal.   No iron  deficiency.   Vitamin-B12 732.    No major nutritional deficiency.  hypercoagulability work-up negative.   No indication for preventative anticoagulation during pregnancy, though Gyn major consider ASA for preeclampsia.  - For iron  and B-vitamins, recommended taking a prenatal.  - Before starting IVF, she can contact the office to get labs done and we will follow-up via telephone.  FOLLOW-UP  Phone visit with Dr Onesimo in 6 months Labs 1-2  days prior to phone visit  The total time spent in the appointment was 20 minutes* .  All of the patient's questions were answered and the patient knows to call the clinic with any problems, questions, or concerns.  Emaline Onesimo MD MS AAHIVMS [facility] Hematology/Oncology Physician [facility]  *Total Encounter Time as defined by the Centers for Medicare and Medicaid Services includes, in addition to the face-to-face time of a patient visit (documented in the note above) non-face-to-face time: obtaining and reviewing outside history, ordering and reviewing medications, tests or procedures, care coordination (communications with other health care professionals or caregivers) and documentation in the medical record.  I,Emily Lagle,acting as a neurosurgeon for Emaline Onesimo, MD.,have documented all relevant documentation on the behalf of Emaline Onesimo, MD,as directed by  Emaline Onesimo, MD while in the presence of Emaline Onesimo, MD.  I have reviewed the above documentation for accuracy and completeness, and I agree with the above.  Chandler Swiderski, MD

## 2024-07-07 ENCOUNTER — Encounter: Payer: Self-pay | Admitting: Hematology

## 2024-07-21 ENCOUNTER — Ambulatory Visit: Admitting: Student

## 2024-09-22 ENCOUNTER — Other Ambulatory Visit: Payer: Self-pay

## 2024-09-22 ENCOUNTER — Telehealth: Admitting: Internal Medicine

## 2024-09-22 DIAGNOSIS — Z96641 Presence of right artificial hip joint: Secondary | ICD-10-CM

## 2024-09-22 NOTE — Progress Notes (Unsigned)
 "     Patient: Sarah Saunders  DOB: 04-05-1976 MRN: [id number] PCP: Verdia Lombard, MD  Referring Provider: ***  No chief complaint on file.    Patient Active Problem List   Diagnosis Date Noted   History of revision of total replacement of right hip joint 02/05/2024   ABLA (acute blood loss anemia) 10/20/2023   Pregnancy 10/19/2023   S/P C-section 10/19/2023   AMA (advanced maternal age) multigravida 35+, third trimester 10/19/2023   Decreased fetal movement affecting management of pregnancy in third trimester, fetus 1 10/19/2023   Placental abruption in third trimester 10/19/2023   Short cervix affecting pregnancy 09/25/2023   Kidney stone complicating pregnancy 07/05/2023   IUFD at 20 weeks or more of gestation 06/19/2022   PSVT (paroxysmal supraventricular tachycardia) 05/26/2019   Iron  deficiency anemia 12/13/2016   Morbid obesity (HCC) 10/09/2016   Tachycardia 01/08/2012   Benign neoplasm of skin 03/06/2008   POLYCYSTIC OVARIAN DISEASE 02/27/2008   Overweight 02/27/2008     Subjective:  Sarah Saunders is a 49 y.o. @GENDER @ with   ROS  Past Medical History:  Diagnosis Date   Allergic rhinitis 02/27/2008   Qualifier: Diagnosis of   By: Krystal MD, [facility] LABOR     IMO SNOMED Dx Update Oct 2024     Anemia    iron  def   Antepartum fetal death    Anxiety    Arthritis    Genital herpes    GERD (gastroesophageal reflux disease)    History of bariatric surgery    History of kidney stones    History of left hip replacement    History of urinary stone    Migraines    Morbid obesity (HCC) 10/09/2016   Pituitary tumor    Polycystic ovary disease    Prolactinoma (HCC)    SVT (supraventricular tachycardia)    Vitamin D deficiency     Outpatient Medications Prior to Visit  Medication Sig Dispense Refill   acetaminophen  (TYLENOL ) 325 MG tablet Take 2 tablets (650 mg total) by mouth every 6 (six) hours. 30 tablet 0   albuterol  (PROVENTIL  HFA;VENTOLIN  HFA) 108  (90 BASE) MCG/ACT inhaler Inhale 2 puffs into the lungs every 4 (four) hours as needed for wheezing or shortness of breath.     amoxicillin  (AMOXIL ) 500 MG capsule Take 1 capsule (500 mg total) by mouth 2 (two) times daily. 60 capsule 5   Cetirizine HCl (ZYRTEC ALLERGY) 10 MG CAPS Take 10 mg by mouth in the morning.     Cholecalciferol (VITAMIN D3) 50 MCG (2000 UT) capsule Take 2,000 Units by mouth daily.     diclofenac (VOLTAREN) 75 MG EC tablet Take 75 mg by mouth 2 (two) times daily.     fluticasone (FLONASE) 50 MCG/ACT nasal spray Place 1 spray into both nostrils daily.     ipratropium (ATROVENT ) 0.03 % nasal spray Place 1 spray into both nostrils in the morning and at bedtime.     methocarbamol  (ROBAXIN ) 750 MG tablet Take 1 tablet (750 mg total) by mouth every 6 (six) hours as needed for muscle spasms. 60 tablet 0   metoprolol  tartrate (LOPRESSOR ) 25 MG tablet TAKE 0.5 TABLETS (12.5 MG TOTAL) BY MOUTH 2 (TWO) TIMES DAILY AS NEEDED (SVT FLARE UPS). 90 tablet 2   RABEprazole (ACIPHEX) 20 MG tablet Take 20 mg by mouth daily.     semaglutide-weight management (WEGOVY) 0.5 MG/0.5ML SOAJ SQ injection Inject 0.5 mg into the skin once a week.  sertraline  (ZOLOFT ) 100 MG tablet Take 200 mg by mouth in the morning.     traZODone  (DESYREL ) 50 MG tablet Take 50-100 mg by mouth at bedtime as needed for sleep.     No facility-administered medications prior to visit.     Allergies[1]  Social History[2]  Family History  Problem Relation Age of Onset   Hypertension Father    Heart failure Father    Hypertension Mother    Heart failure Brother     Objective:  There were no vitals filed for this visit. There is no height or weight on file to calculate BMI.  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 Patient on amoxicillin  prior to OR.  -  At clinic her Ryland was positive E faecalis.  Blood cultures no growth. - Discharged on daptomycin  and ceftriaxone 's x 6 weeks EOT 7/29 then transitioned to Amoxil  500 mg p.o. twice daily(started on 7/30) for suppresion x 6 monthstill 09/19/24 -Pt follows with ortho -06/09/24  labs stable  Plan Stop abx Labs in the next few days F/u in one month  #alp elevation 154 on 3/25/250-> 145 02/13/24. This is prior to abx starting. Unrelated -Follow up with GI  Loney Stank, MD [facility]   09/22/24  8:30 AM     [1]  Allergies Allergen Reactions   Butorphanol Hives and Itching   Other Other (See Comments)    Seasonal allergies  [2]  Social History Tobacco Use   Smoking status: Never   Smokeless tobacco: Never  Vaping Use   Vaping status: Never Used  Substance Use Topics   Alcohol  use: No   Drug use: No   "

## 2024-12-29 ENCOUNTER — Inpatient Hospital Stay

## 2025-01-07 ENCOUNTER — Inpatient Hospital Stay: Admitting: Hematology
